# Patient Record
Sex: Female | Born: 1945
Health system: Southern US, Community
[De-identification: ages and names within clinical notes are randomized; demographics above are authoritative.]

## PROBLEM LIST (undated history)

## (undated) DIAGNOSIS — K219 Gastro-esophageal reflux disease without esophagitis: Secondary | ICD-10-CM

## (undated) DIAGNOSIS — D510 Vitamin B12 deficiency anemia due to intrinsic factor deficiency: Secondary | ICD-10-CM

## (undated) DIAGNOSIS — L900 Lichen sclerosus et atrophicus: Secondary | ICD-10-CM

## (undated) DIAGNOSIS — G5600 Carpal tunnel syndrome, unspecified upper limb: Secondary | ICD-10-CM

## (undated) DIAGNOSIS — M7550 Bursitis of unspecified shoulder: Secondary | ICD-10-CM

## (undated) HISTORY — DX: Lichen sclerosus et atrophicus: L90.0

## (undated) HISTORY — DX: Bursitis of unspecified shoulder: M75.50

## (undated) HISTORY — PX: TONSILLECTOMY: SUR1361

## (undated) HISTORY — PX: ABDOMINAL HYSTERECTOMY: SHX81

## (undated) HISTORY — DX: Carpal tunnel syndrome, unspecified upper limb: G56.00

## (undated) HISTORY — DX: Vitamin B12 deficiency anemia due to intrinsic factor deficiency: D51.0

---

## 2006-12-16 ENCOUNTER — Ambulatory Visit: Payer: Self-pay | Admitting: Unknown Physician Specialty

## 2008-11-24 ENCOUNTER — Emergency Department: Payer: Self-pay

## 2008-11-26 ENCOUNTER — Inpatient Hospital Stay: Payer: Self-pay | Admitting: Internal Medicine

## 2011-01-24 ENCOUNTER — Emergency Department: Payer: Self-pay | Admitting: Emergency Medicine

## 2011-09-11 ENCOUNTER — Emergency Department: Payer: Self-pay | Admitting: Emergency Medicine

## 2011-10-08 ENCOUNTER — Ambulatory Visit: Payer: Self-pay | Admitting: Unknown Physician Specialty

## 2012-06-15 ENCOUNTER — Emergency Department: Payer: Self-pay | Admitting: Emergency Medicine

## 2012-06-15 LAB — CBC
MCH: 27.2 pg (ref 26.0–34.0)
MCV: 83 fL (ref 80–100)
Platelet: 300 10*3/uL (ref 150–440)
RBC: 4.72 10*6/uL (ref 3.80–5.20)
RDW: 17 % — ABNORMAL HIGH (ref 11.5–14.5)

## 2012-06-15 LAB — COMPREHENSIVE METABOLIC PANEL
Albumin: 4 g/dL (ref 3.4–5.0)
Alkaline Phosphatase: 89 U/L (ref 50–136)
Anion Gap: 8 (ref 7–16)
BUN: 7 mg/dL (ref 7–18)
Bilirubin,Total: 0.5 mg/dL (ref 0.2–1.0)
Co2: 31 mmol/L (ref 21–32)
Creatinine: 0.66 mg/dL (ref 0.60–1.30)
EGFR (African American): >60
Glucose: 112 mg/dL — ABNORMAL HIGH (ref 65–99)
Osmolality: 282 (ref 275–301)
SGPT (ALT): 21 U/L
Sodium: 142 mmol/L (ref 136–145)
Total Protein: 7.5 g/dL (ref 6.4–8.2)

## 2012-06-15 LAB — URINALYSIS, COMPLETE
Bacteria: NONE SEEN
Bilirubin,UR: NEGATIVE
Blood: NEGATIVE
Nitrite: NEGATIVE
Ph: 8 (ref 4.5–8.0)
RBC,UR: <1 /HPF (ref 0–5)
Specific Gravity: 1.009 (ref 1.003–1.030)
Squamous Epithelial: <1
WBC UR: NONE SEEN /HPF (ref 0–5)

## 2012-06-15 LAB — MAGNESIUM: Magnesium: 1.8 mg/dL

## 2012-06-15 LAB — CK TOTAL AND CKMB (NOT AT ARMC)
CK, Total: 104 U/L (ref 21–215)
CK-MB: 0.8 ng/mL (ref 0.5–3.6)

## 2012-06-15 LAB — TROPONIN I: Troponin-I: <0.02 ng/mL

## 2012-06-15 LAB — PHOSPHORUS: Phosphorus: 3.3 mg/dL (ref 2.5–4.9)

## 2013-12-01 HISTORY — PX: OTHER SURGICAL HISTORY: SHX169

## 2014-01-16 ENCOUNTER — Emergency Department: Payer: Self-pay | Admitting: Emergency Medicine

## 2014-05-14 ENCOUNTER — Emergency Department: Payer: Self-pay | Admitting: Emergency Medicine

## 2014-05-14 LAB — URINALYSIS, COMPLETE
BILIRUBIN, UR: NEGATIVE
Bacteria: NONE SEEN
Blood: NEGATIVE
Glucose,UR: NEGATIVE mg/dL (ref 0–75)
Leukocyte Esterase: NEGATIVE
Nitrite: NEGATIVE
Ph: 7 (ref 4.5–8.0)
Protein: NEGATIVE
Specific Gravity: 1.058 (ref 1.003–1.030)
Squamous Epithelial: 1
WBC UR: 1 /HPF (ref 0–5)

## 2014-05-14 LAB — CBC
HCT: 36.8 % (ref 35.0–47.0)
HGB: 12 g/dL (ref 12.0–16.0)
MCH: 26.5 pg (ref 26.0–34.0)
MCHC: 32.7 g/dL (ref 32.0–36.0)
MCV: 81 fL (ref 80–100)
Platelet: 277 10*3/uL (ref 150–440)
RBC: 4.55 10*6/uL (ref 3.80–5.20)
RDW: 17.7 % — ABNORMAL HIGH (ref 11.5–14.5)
WBC: 4.7 10*3/uL (ref 3.6–11.0)

## 2014-05-14 LAB — COMPREHENSIVE METABOLIC PANEL
ALBUMIN: 3.7 g/dL (ref 3.4–5.0)
ALT: 17 U/L (ref 12–78)
Alkaline Phosphatase: 77 U/L
Anion Gap: 6 — ABNORMAL LOW (ref 7–16)
BUN: 6 mg/dL — ABNORMAL LOW (ref 7–18)
Bilirubin,Total: 0.5 mg/dL (ref 0.2–1.0)
CHLORIDE: 105 mmol/L (ref 98–107)
CREATININE: 0.54 mg/dL — AB (ref 0.60–1.30)
Calcium, Total: 9 mg/dL (ref 8.5–10.1)
Co2: 28 mmol/L (ref 21–32)
EGFR (Non-African Amer.): >60
GLUCOSE: 109 mg/dL — AB (ref 65–99)
Osmolality: 276 (ref 275–301)
Potassium: 3.7 mmol/L (ref 3.5–5.1)
SGOT(AST): 16 U/L (ref 15–37)
Sodium: 139 mmol/L (ref 136–145)
TOTAL PROTEIN: 6.6 g/dL (ref 6.4–8.2)

## 2014-05-14 LAB — LIPASE, BLOOD: Lipase: 127 U/L (ref 73–393)

## 2014-06-21 ENCOUNTER — Ambulatory Visit: Payer: Self-pay | Admitting: Obstetrics and Gynecology

## 2014-06-21 LAB — BASIC METABOLIC PANEL
Anion Gap: 3 — ABNORMAL LOW (ref 7–16)
BUN: 11 mg/dL (ref 7–18)
Calcium, Total: 8.1 mg/dL — ABNORMAL LOW (ref 8.5–10.1)
Chloride: 103 mmol/L (ref 98–107)
Co2: 34 mmol/L — ABNORMAL HIGH (ref 21–32)
Creatinine: 0.71 mg/dL (ref 0.60–1.30)
EGFR (African American): >60
EGFR (Non-African Amer.): >60
GLUCOSE: 83 mg/dL (ref 65–99)
Osmolality: 278 (ref 275–301)
Potassium: 4.1 mmol/L (ref 3.5–5.1)
SODIUM: 140 mmol/L (ref 136–145)

## 2014-06-21 LAB — CBC
HCT: 36 % (ref 35.0–47.0)
HGB: 11.4 g/dL — AB (ref 12.0–16.0)
MCH: 26.1 pg (ref 26.0–34.0)
MCHC: 31.6 g/dL — ABNORMAL LOW (ref 32.0–36.0)
MCV: 83 fL (ref 80–100)
Platelet: 321 10*3/uL (ref 150–440)
RBC: 4.35 10*6/uL (ref 3.80–5.20)
RDW: 17.5 % — ABNORMAL HIGH (ref 11.5–14.5)
WBC: 5.2 10*3/uL (ref 3.6–11.0)

## 2014-06-27 ENCOUNTER — Ambulatory Visit: Payer: Self-pay | Admitting: Obstetrics and Gynecology

## 2014-06-28 LAB — CBC WITH DIFFERENTIAL/PLATELET
BASOS ABS: 0 10*3/uL (ref 0.0–0.1)
Basophil %: 0.1 %
EOS PCT: 0 %
Eosinophil #: 0 10*3/uL (ref 0.0–0.7)
HCT: 33 % — AB (ref 35.0–47.0)
HGB: 10.4 g/dL — ABNORMAL LOW (ref 12.0–16.0)
LYMPHS ABS: 1.4 10*3/uL (ref 1.0–3.6)
LYMPHS PCT: 11.7 %
MCH: 26 pg (ref 26.0–34.0)
MCHC: 31.5 g/dL — AB (ref 32.0–36.0)
MCV: 83 fL (ref 80–100)
Monocyte #: 1 x10 3/mm — ABNORMAL HIGH (ref 0.2–0.9)
Monocyte %: 8.3 %
Neutrophil #: 9.3 10*3/uL — ABNORMAL HIGH (ref 1.4–6.5)
Neutrophil %: 79.9 %
PLATELETS: 318 10*3/uL (ref 150–440)
RBC: 4 10*6/uL (ref 3.80–5.20)
RDW: 17.8 % — AB (ref 11.5–14.5)
WBC: 11.6 10*3/uL — ABNORMAL HIGH (ref 3.6–11.0)

## 2014-06-28 LAB — BASIC METABOLIC PANEL
ANION GAP: 7 (ref 7–16)
BUN: 6 mg/dL — ABNORMAL LOW (ref 7–18)
CALCIUM: 8.2 mg/dL — AB (ref 8.5–10.1)
CHLORIDE: 106 mmol/L (ref 98–107)
CO2: 29 mmol/L (ref 21–32)
CREATININE: 0.56 mg/dL — AB (ref 0.60–1.30)
EGFR (African American): >60
EGFR (Non-African Amer.): >60
GLUCOSE: 150 mg/dL — AB (ref 65–99)
OSMOLALITY: 284 (ref 275–301)
Potassium: 3.8 mmol/L (ref 3.5–5.1)
Sodium: 142 mmol/L (ref 136–145)

## 2014-06-30 LAB — PATHOLOGY REPORT

## 2015-01-07 LAB — HM MAMMOGRAPHY

## 2015-01-17 ENCOUNTER — Ambulatory Visit: Payer: Self-pay

## 2015-01-17 LAB — HM MAMMOGRAPHY

## 2015-03-24 NOTE — Op Note (Signed)
PATIENT NAME:  Kayla Johnson, Kayla Johnson MR#:  [id number] DATE OF BIRTH:  Apr 29, 1946  DATE OF PROCEDURE:  06/27/2014  PREOPERATIVE DIAGNOSES: Acute onset pelvic pain and pedunculated fibroid.   POSTOPERATIVE DIAGNOSES:  1.  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 A small, 1-cm subserosal fibroid at the fundus of the uterus.  2.  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   OPERATION PERFORMED:  1.  Laparoscopic lysis of adhesions.  2.  Bilateral salpingo-oophorectomy.   ANESTHESIA USED: General.   PRIMARY SURGEON: Dorthula Nettles, M.D.   ASSISTANT:  Barnett Applebaum, M.D.   ESTIMATED BLOOD LOSS: 15 mL.   OPERATIVE FLUIDS: 750 mL of crystalloid.   PREOPERATIVE ANTIBIOTICS: 1 gram of Ancef.   DRAINS OR TUBES:  Foley to gravity drainage.   IMPLANTS: None.   INTRAOPERATIVE FINDINGS: The patient presented to the ER previously with acute onset abdominal pain, which was attributed to potential torsion of what appeared to be a pedunculated fibroid on ultrasound and CT scan. This had been documented on prior scans and was stable in size. Differential did include ovarian etiology and CA-125 was obtained and was normal  preoperatively. The fibroid appeared to have a feeder vessel on imaging with Doppler flow.  Given the fact that the patient had had prior surgery on her right ovary via a midline vertical incision, the decision was made for Palmer point entry, given that we suspected adhesions in the midline. The pathology from her initial surgery, which was several years ago, was not available for review.    The entry into the abdomen was via Phineas Douglas point. There were adhesions in the midline which were taken down bluntly.  The left ovary was grossly normal in appearance and atrophic. The left tube was normal, as well. The uterus was unremarkable other than a small 1-cm subserosal fundal fibroid. The right ovary  was grossly enlarged and likely represented the findings on CT and ultrasound. There was a solid 4-5 cm mass likely representing an ovarian fibroma.  The right tube was normal. The ureters were visualized pre- and post dissection.   SPECIMENS REMOVED: Right tube and ovary and left tube and ovary.   PATIENT CONDITION FOLLOWING PROCEDURE: Stable.   PROCEDURE IN DETAIL: Risks, benefits, and alternatives of the procedure were discussed with the patient prior to proceeding to the operating room. The patient was taken to the operating room where she was placed under general endotracheal anesthesia. She was positioned in the dorsal lithotomy position utilizing Allen stirrups, prepped and draped in the usual sterile fashion. A timeout was performed.   Attention was turned to the patient's pelvis. The bladder was drained via an indwelling Foley catheter. An operative speculum was then placed. The anterior lip of the cervix was visualized, grasped with a single-tooth tenaculum and a Hulka tenaculum was then placed to allow uterine manipulation. The single-tooth tenaculum and speculum were removed.   Attention was turned to the patient's abdomen. Palmer point was injected with 0.5% Sensorcaine. A stab incision was made and a 5-mm XL trocar was used to gain entry into the peritoneal cavity via Palmer point under direct visualization. Once entry into the abdominal cavity had been established, pneumoperitoneum was started. Inspection of the abdomen and pelvis revealed filmy adhesions of the omentum to the prior midline vertical incision.   The uterus was not able to be visualized prior to taking down the adhesions. An 8-mm left lower quadrant robotic trocar  was placed and a blunt grasper was used to take down the midline adhesions. Following this, the uterus was able to be clearly visualized. There was no fibroid other than a small 1-cm subserosal fibroid at the fundus. The right ovary, however, was grossly enlarged  with a solid tumor, likely representing ovarian fibroma. A 12-mm assistant port was placed at the umbilicus.  The right ovary was grasped with a blunt grasper. There was an adhesion of small bowel to the right ovary which was taken down using a 5-mm LigaSure. The ureter was visualized. The IP ligament was then transected well away from the ureter using the 5-mm Harmonic device and the ovary and right fallopian tube were dissected off  the attachments to the uterus and mesosalpinx using make change the Harmonic device. The specimen was then placed in the cul-de-sac.    The left ovary was visualized and noted to be atrophic. There were some adhesions of the left sigmoid colon to the pelvic sidewall precluding clear visualization of the IP ligament.  The colon was mobilized by dissecting the retroperitoneum and the IP ligament was then clearly visualized as was the left ureter. The IP ligament was transected using a 5-mm Harmonic device, and then the ovary and tube were dissected off the attachments to the mesosalpinx and the uterus also using the 5-mm Harmonic.   Following this the left ovary and tube were removed through the 12-mm port site. The port site was then extended, and a reusable Endo Catch bag was placed into the pelvis into which the right ovary was placed. The ovary was then removed via the Endo Catch bag. Some morcellation of the ovary had to be done in order to remove the ovary intact. This was done within the bag itself without spillage.   Once the right ovary was removed the fascial incision, which had been extended to approximately 3-4 cm, was closed using #1 Vicryl on a UR-6 needle. Following fascial closure the skin was closed using 4-0 Monocryl in a subcuticular fashion. The pelvis was reinspected.  All pedicles were noted to be hemostatic. The pelvis was irrigated and pneumoperitoneum was evacuated. The port sites were dressed with Dermabond. Sponge, needle, and instrument counts were  correct x2. The patient tolerated the procedure well and was taken to the recovery room in stable condition.   ____________________________ Stoney Bang. Georgianne Fick, MD ams:lt D: 06/28/2014 08:32:43 ET T: 06/28/2014 10:14:37 ET JOB#: [id number]  cc: Stoney Bang. Georgianne Fick, MD, <Dictator> Dorthula Nettles MD ELECTRONICALLY SIGNED 06/28/2014 22:23

## 2015-05-04 ENCOUNTER — Ambulatory Visit (INDEPENDENT_AMBULATORY_CARE_PROVIDER_SITE_OTHER): Payer: PPO | Admitting: Unknown Physician Specialty

## 2015-05-04 DIAGNOSIS — E538 Deficiency of other specified B group vitamins: Secondary | ICD-10-CM

## 2015-05-04 MED ORDER — CYANOCOBALAMIN 1000 MCG/ML IJ SOLN
1000.0000 ug | Freq: Once | INTRAMUSCULAR | Status: AC
  Administered 2015-05-04: 1000 ug via INTRAMUSCULAR

## 2015-05-11 ENCOUNTER — Ambulatory Visit (INDEPENDENT_AMBULATORY_CARE_PROVIDER_SITE_OTHER): Payer: PPO

## 2015-05-11 DIAGNOSIS — E538 Deficiency of other specified B group vitamins: Secondary | ICD-10-CM

## 2015-05-11 MED ORDER — CYANOCOBALAMIN 1000 MCG/ML IJ SOLN
1000.0000 ug | Freq: Once | INTRAMUSCULAR | Status: AC
  Administered 2015-05-11: 1000 ug via INTRAMUSCULAR

## 2015-06-06 ENCOUNTER — Ambulatory Visit (INDEPENDENT_AMBULATORY_CARE_PROVIDER_SITE_OTHER): Payer: PPO

## 2015-06-06 DIAGNOSIS — E538 Deficiency of other specified B group vitamins: Secondary | ICD-10-CM

## 2015-06-06 MED ORDER — CYANOCOBALAMIN 1000 MCG/ML IJ SOLN
1000.0000 ug | Freq: Once | INTRAMUSCULAR | Status: AC
  Administered 2015-06-06: 1000 ug via INTRAMUSCULAR

## 2015-07-04 ENCOUNTER — Ambulatory Visit (INDEPENDENT_AMBULATORY_CARE_PROVIDER_SITE_OTHER): Payer: PPO

## 2015-07-04 DIAGNOSIS — D509 Iron deficiency anemia, unspecified: Secondary | ICD-10-CM

## 2015-07-04 MED ORDER — CYANOCOBALAMIN 1000 MCG/ML IJ SOLN
1000.0000 ug | Freq: Once | INTRAMUSCULAR | Status: AC
  Administered 2015-07-04: 1000 ug via INTRAMUSCULAR

## 2015-08-01 ENCOUNTER — Ambulatory Visit (INDEPENDENT_AMBULATORY_CARE_PROVIDER_SITE_OTHER): Payer: PPO

## 2015-08-01 DIAGNOSIS — E538 Deficiency of other specified B group vitamins: Secondary | ICD-10-CM | POA: Diagnosis not present

## 2015-08-01 MED ORDER — CYANOCOBALAMIN 1000 MCG/ML IJ SOLN
1000.0000 ug | Freq: Once | INTRAMUSCULAR | Status: AC
  Administered 2015-08-01: 1000 ug via INTRAMUSCULAR

## 2015-08-29 ENCOUNTER — Ambulatory Visit (INDEPENDENT_AMBULATORY_CARE_PROVIDER_SITE_OTHER): Payer: PPO

## 2015-08-29 DIAGNOSIS — E538 Deficiency of other specified B group vitamins: Secondary | ICD-10-CM | POA: Diagnosis not present

## 2015-08-29 MED ORDER — CYANOCOBALAMIN 1000 MCG/ML IJ SOLN
1000.0000 ug | Freq: Once | INTRAMUSCULAR | Status: AC
  Administered 2015-08-29: 1000 ug via INTRAMUSCULAR

## 2015-09-26 ENCOUNTER — Ambulatory Visit (INDEPENDENT_AMBULATORY_CARE_PROVIDER_SITE_OTHER): Payer: PPO

## 2015-09-26 DIAGNOSIS — E538 Deficiency of other specified B group vitamins: Secondary | ICD-10-CM | POA: Diagnosis not present

## 2015-09-26 MED ORDER — CYANOCOBALAMIN 1000 MCG/ML IJ SOLN
1000.0000 ug | Freq: Once | INTRAMUSCULAR | Status: AC
  Administered 2015-09-26: 1000 ug via INTRAMUSCULAR

## 2015-10-22 ENCOUNTER — Encounter: Payer: Self-pay | Admitting: Family Medicine

## 2015-10-22 ENCOUNTER — Ambulatory Visit (INDEPENDENT_AMBULATORY_CARE_PROVIDER_SITE_OTHER): Payer: PPO | Admitting: Family Medicine

## 2015-10-22 VITALS — BP 99/62 | HR 78 | Temp 97.8°F | Ht 61.8 in | Wt 105.0 lb

## 2015-10-22 DIAGNOSIS — J019 Acute sinusitis, unspecified: Secondary | ICD-10-CM | POA: Diagnosis not present

## 2015-10-22 DIAGNOSIS — J329 Chronic sinusitis, unspecified: Secondary | ICD-10-CM | POA: Insufficient documentation

## 2015-10-22 DIAGNOSIS — D510 Vitamin B12 deficiency anemia due to intrinsic factor deficiency: Secondary | ICD-10-CM | POA: Insufficient documentation

## 2015-10-22 MED ORDER — AMOXICILLIN 875 MG PO TABS: 875.0000 mg | ORAL_TABLET | Freq: Two times a day (BID) | ORAL | Status: DC

## 2015-10-22 NOTE — Assessment & Plan Note (Signed)
Patient taking her monthly B12 shot

## 2015-10-22 NOTE — Progress Notes (Signed)
   BP 99/62 mmHg  Pulse 78  Temp(Src) 97.8 F (36.6 C)  Ht 5' 1.8" (1.57 m)  Wt 105 lb (47.628 kg)  BMI 19.32 kg/m2  SpO2 99%   Subjective:    Patient ID: Kayla Johnson, female    DOB: 1946-02-11, 69 y.o.   MRN: [id number]  HPI: [name] is a 69 y.o. female  Chief Complaint  Patient presents with  . URI    with cough   patient with sinus congestion and drainage cough going on for the last 5 days not sure about fever but has felt hot and sweaty and achy all over with marked fatigue With a productive cough Sinus pressure worse with bending her head leaning forward   Relevant past medical, surgical, family and social history reviewed and updated as indicated. Interim medical history since our last visit reviewed. Allergies and medications reviewed and updated.  Review of Systems  Constitutional: Positive for fever, chills and fatigue.  HENT: Positive for congestion, postnasal drip, rhinorrhea, sinus pressure, sneezing and sore throat.   Respiratory: Positive for cough. Negative for choking, chest tightness and shortness of breath.   Cardiovascular: Negative for chest pain, palpitations and leg swelling.    Per HPI unless specifically indicated above     Objective:    BP 99/62 mmHg  Pulse 78  Temp(Src) 97.8 F (36.6 C)  Ht 5' 1.8" (1.57 m)  Wt 105 lb (47.628 kg)  BMI 19.32 kg/m2  SpO2 99%  Wt Readings from Last 3 Encounters:  10/22/15 105 lb (47.628 kg)  04/11/15 109 lb (49.442 kg)    Physical Exam  Constitutional: She is oriented to person, place, and time. She appears well-developed and well-nourished. No distress.  HENT:  Head: Normocephalic and atraumatic.  Right Ear: Hearing and external ear normal.  Left Ear: Hearing and external ear normal.  Nose: Nose normal.  Mouth/Throat: Oropharyngeal exudate present.  Eyes: Conjunctivae and lids are normal. Pupils are equal, round, and reactive to light. Right eye exhibits no discharge. Left eye exhibits no  discharge. No scleral icterus.  Neck: No thyromegaly present.  Cardiovascular: Normal rate, regular rhythm and normal heart sounds.   Pulmonary/Chest: Effort normal and breath sounds normal. No respiratory distress.  Musculoskeletal: Normal range of motion.  Lymphadenopathy:    She has no cervical adenopathy.  Neurological: She is alert and oriented to person, place, and time.  Skin: Skin is intact. No rash noted.  Psychiatric: She has a normal mood and affect. Her speech is normal and behavior is normal. Judgment and thought content normal. Cognition and memory are normal.        Assessment & Plan:   Problem List Items Addressed This Visit      Respiratory   Sinusitis - Primary   Relevant Medications   amoxicillin (AMOXIL) 875 MG tablet     Other   Pernicious anemia    Patient taking her monthly B12 shot          Follow up plan: Return for Physical Exam with Kathrine Haddock.

## 2015-10-24 ENCOUNTER — Ambulatory Visit: Payer: PPO

## 2015-10-24 ENCOUNTER — Telehealth: Payer: Self-pay | Admitting: Family Medicine

## 2015-10-24 ENCOUNTER — Encounter: Payer: Self-pay | Admitting: Family Medicine

## 2015-10-24 NOTE — Telephone Encounter (Signed)
Called pt and informed work excuse note was ready for pick up.

## 2015-10-24 NOTE — Telephone Encounter (Signed)
Ok

## 2015-10-24 NOTE — Telephone Encounter (Signed)
Pt called needs a work note for 11/21-11/25/16. Can the note be worded to say that Thursday and Friday are at her discretion. Thanks.

## 2015-10-24 NOTE — Telephone Encounter (Signed)
All set!

## 2015-10-31 ENCOUNTER — Ambulatory Visit (INDEPENDENT_AMBULATORY_CARE_PROVIDER_SITE_OTHER): Payer: PPO

## 2015-10-31 DIAGNOSIS — E538 Deficiency of other specified B group vitamins: Secondary | ICD-10-CM

## 2015-10-31 MED ORDER — CYANOCOBALAMIN 1000 MCG/ML IJ SOLN
1000.0000 ug | Freq: Once | INTRAMUSCULAR | Status: AC
  Administered 2015-10-31: 1000 ug via INTRAMUSCULAR

## 2015-11-08 ENCOUNTER — Encounter: Payer: Self-pay | Admitting: Unknown Physician Specialty

## 2015-11-21 ENCOUNTER — Encounter: Payer: PPO | Admitting: Unknown Physician Specialty

## 2015-11-27 ENCOUNTER — Other Ambulatory Visit: Payer: Self-pay | Admitting: Family Medicine

## 2015-11-27 DIAGNOSIS — D510 Vitamin B12 deficiency anemia due to intrinsic factor deficiency: Secondary | ICD-10-CM

## 2015-11-27 MED ORDER — CYANOCOBALAMIN 1000 MCG/ML IJ SOLN
1000.0000 ug | Freq: Once | INTRAMUSCULAR | Status: AC
  Administered 2015-11-28: 1000 ug via INTRAMUSCULAR

## 2015-11-27 NOTE — Assessment & Plan Note (Signed)
IM B12 1000 mcg x 1 tomorrow

## 2015-11-28 ENCOUNTER — Ambulatory Visit (INDEPENDENT_AMBULATORY_CARE_PROVIDER_SITE_OTHER): Payer: PPO

## 2015-11-28 DIAGNOSIS — D510 Vitamin B12 deficiency anemia due to intrinsic factor deficiency: Secondary | ICD-10-CM

## 2015-12-12 ENCOUNTER — Ambulatory Visit (INDEPENDENT_AMBULATORY_CARE_PROVIDER_SITE_OTHER): Payer: PPO | Admitting: Unknown Physician Specialty

## 2015-12-12 ENCOUNTER — Encounter: Payer: Self-pay | Admitting: Unknown Physician Specialty

## 2015-12-12 VITALS — BP 117/71 | HR 89 | Temp 98.4°F | Ht 62.0 in | Wt 106.2 lb

## 2015-12-12 DIAGNOSIS — Z23 Encounter for immunization: Secondary | ICD-10-CM | POA: Diagnosis not present

## 2015-12-12 DIAGNOSIS — Z0000 Encounter for general adult medical examination without abnormal findings: Secondary | ICD-10-CM | POA: Diagnosis not present

## 2015-12-12 DIAGNOSIS — H9313 Tinnitus, bilateral: Secondary | ICD-10-CM

## 2015-12-12 DIAGNOSIS — J309 Allergic rhinitis, unspecified: Secondary | ICD-10-CM

## 2015-12-12 DIAGNOSIS — D510 Vitamin B12 deficiency anemia due to intrinsic factor deficiency: Secondary | ICD-10-CM

## 2015-12-12 MED ORDER — CYANOCOBALAMIN 1000 MCG/ML IJ SOLN
1000.0000 ug | INTRAMUSCULAR | Status: DC
  Administered 2016-02-20 – 2017-07-16 (×8): 1000 ug via INTRAMUSCULAR

## 2015-12-12 NOTE — Patient Instructions (Signed)
Try plain Loratadine or Fexofenodine for allergies.

## 2015-12-12 NOTE — Assessment & Plan Note (Addendum)
Vit B12 today.  Continue monthly B12 injections but check todays labs

## 2015-12-12 NOTE — Progress Notes (Signed)
BP 117/71 mmHg  Pulse 89  Temp(Src) 98.4 F (36.9 C)  Ht 5\' 2"  (1.575 m)  Wt 106 lb 3.2 oz (48.172 kg)  BMI 19.42 kg/m2  SpO2 98%   Subjective:    Patient ID: Kayla Johnson, female    DOB: 06/14/1946, 70 y.o.   MRN: [id number]  HPI: Kayla Johnson is a 70 y.o. female  Chief Complaint  Patient presents with  . Medicare Wellness    pt states she has been having "ringing in her head" and sneezing   Functional Status Survey: Is the patient deaf or have difficulty hearing?: No Does the patient have difficulty seeing, even when wearing glasses/contacts?: No Does the patient have difficulty concentrating, remembering, or making decisions?: No Does the patient have difficulty walking or climbing stairs?: No Does the patient have difficulty dressing or bathing?: No Does the patient have difficulty doing errands alone such as visiting a doctor's office or shopping?: No  Depression screen [facility] 2/9 12/12/2015 10/22/2015  Decreased Interest 0 0  Down, Depressed, Hopeless 0 0  PHQ - 2 Score 0 0   Fall Risk  12/12/2015 10/22/2015  Falls in the past year? No No    Pt is able to perform complex mental tasks, recognize clock face, recognize time and do a 3 item recall.     Pernicious Anemia Gets monthly B12 injections.  Without symptoms  Relevant past medical, surgical, family and social history reviewed and updated as indicated. Interim medical history since our last visit reviewed. Allergies and medications reviewed and updated.  Review of Systems  Constitutional: Negative.   HENT: Positive for sneezing.        Pt is c/o "buzzing" in her ears bilaterally  Eyes: Negative.   Respiratory: Negative.   Cardiovascular: Negative.   Gastrointestinal: Negative.   Endocrine: Negative.   Genitourinary: Negative.   Musculoskeletal: Negative.   Skin: Negative.   Allergic/Immunologic: Negative.   Neurological: Negative.   Hematological: Negative.   Psychiatric/Behavioral: Negative.      Per HPI unless specifically indicated above     Objective:    BP 117/71 mmHg  Pulse 89  Temp(Src) 98.4 F (36.9 C)  Ht 5\' 2"  (1.575 m)  Wt 106 lb 3.2 oz (48.172 kg)  BMI 19.42 kg/m2  SpO2 98%  Wt Readings from Last 3 Encounters:  12/12/15 106 lb 3.2 oz (48.172 kg)  10/22/15 105 lb (47.628 kg)  04/11/15 109 lb (49.442 kg)    Physical Exam  Constitutional: She is oriented to person, place, and time. She appears well-developed and well-nourished.  HENT:  Head: Normocephalic and atraumatic.  Eyes: Pupils are equal, round, and reactive to light. Right eye exhibits no discharge. Left eye exhibits no discharge. No scleral icterus.  Neck: Normal range of motion. Neck supple. Carotid bruit is not present. No thyromegaly present.  Cardiovascular: Normal rate, regular rhythm and normal heart sounds.  Exam reveals no gallop and no friction rub.   No murmur heard. Pulmonary/Chest: Effort normal and breath sounds normal. No respiratory distress. She has no wheezes. She has no rales.  Abdominal: Soft. Bowel sounds are normal. There is no tenderness. There is no rebound.  Genitourinary: No breast swelling, tenderness or discharge.  Musculoskeletal: Normal range of motion.  Lymphadenopathy:    She has no cervical adenopathy.  Neurological: She is alert and oriented to person, place, and time.  Skin: Skin is warm, dry and intact. No rash noted.  Psychiatric: She has a normal mood and  affect. Her speech is normal and behavior is normal. Judgment and thought content normal. Cognition and memory are normal.     Assessment & Plan:   Problem List Items Addressed This Visit      Unprioritized   Pernicious anemia    Vit B12 today.  Continue monthly B12 injections but check todays labs      Relevant Medications   cyanocobalamin ((VITAMIN B-12)) injection 1,000 mcg (Start on 12/12/2015  3:00 PM)   Other Relevant Orders   Vitamin B12   CBC with Differential/Platelet   Bilateral tinnitus -  Primary    Refer to ENT at pt request      Relevant Orders   Ambulatory referral to ENT   Allergic rhinitis    Other Visit Diagnoses    Routine general medical examination at a health care facility        Relevant Orders    Pneumococcal conjugate vaccine 13-valent IM    Cologuard    Lipid Panel w/o Chol/HDL Ratio        Follow up plan: Return in about 1 year (around 12/11/2016).

## 2015-12-12 NOTE — Assessment & Plan Note (Signed)
Refer to ENT at pt request. 

## 2015-12-13 LAB — CBC WITH DIFFERENTIAL/PLATELET
BASOS ABS: 0 10*3/uL (ref 0.0–0.2)
Basos: 0 %
EOS (ABSOLUTE): 0.2 10*3/uL (ref 0.0–0.4)
Eos: 4 %
HEMOGLOBIN: 11.2 g/dL (ref 11.1–15.9)
Hematocrit: 34 % (ref 34.0–46.6)
IMMATURE GRANS (ABS): 0 10*3/uL (ref 0.0–0.1)
Immature Granulocytes: 0 %
LYMPHS: 33 %
Lymphocytes Absolute: 1.9 10*3/uL (ref 0.7–3.1)
MCH: 25.9 pg — AB (ref 26.6–33.0)
MCHC: 32.9 g/dL (ref 31.5–35.7)
MCV: 79 fL (ref 79–97)
MONOCYTES: 8 %
Monocytes Absolute: 0.5 10*3/uL (ref 0.1–0.9)
NEUTROS ABS: 3.1 10*3/uL (ref 1.4–7.0)
Neutrophils: 55 %
PLATELETS: 433 10*3/uL — AB (ref 150–379)
RBC: 4.33 x10E6/uL (ref 3.77–5.28)
RDW: 16.1 % — ABNORMAL HIGH (ref 12.3–15.4)
WBC: 5.7 10*3/uL (ref 3.4–10.8)

## 2015-12-13 LAB — LIPID PANEL W/O CHOL/HDL RATIO
CHOLESTEROL TOTAL: 173 mg/dL (ref 100–199)
HDL: 55 mg/dL (ref >39)
LDL CALC: 107 mg/dL — AB (ref 0–99)
TRIGLYCERIDES: 54 mg/dL (ref 0–149)
VLDL CHOLESTEROL CAL: 11 mg/dL (ref 5–40)

## 2015-12-13 LAB — VITAMIN B12: VITAMIN B 12: 688 pg/mL (ref 211–946)

## 2015-12-26 ENCOUNTER — Other Ambulatory Visit: Payer: Self-pay | Admitting: Unknown Physician Specialty

## 2015-12-26 ENCOUNTER — Ambulatory Visit (INDEPENDENT_AMBULATORY_CARE_PROVIDER_SITE_OTHER): Payer: PPO

## 2015-12-26 DIAGNOSIS — E538 Deficiency of other specified B group vitamins: Secondary | ICD-10-CM

## 2015-12-26 DIAGNOSIS — D510 Vitamin B12 deficiency anemia due to intrinsic factor deficiency: Secondary | ICD-10-CM

## 2015-12-26 MED ORDER — CYANOCOBALAMIN 1000 MCG/ML IJ SOLN
1000.0000 ug | INTRAMUSCULAR | Status: DC
  Administered 2015-12-26 – 2017-11-18 (×15): 1000 ug via INTRAMUSCULAR

## 2016-01-02 LAB — COLOGUARD: Cologuard: NEGATIVE

## 2016-01-08 ENCOUNTER — Telehealth: Payer: Self-pay

## 2016-01-08 NOTE — Telephone Encounter (Signed)
Have called patient multiple times to tell her of her appointment with Dr. Richardson Landry at [facility] 01-16-2016 at 2:00. Patient has not answered my calls and have left messages for her to return my call.  Just a heads up in case you'd like to reach out to her as well or if she calls and asks to speak to you.  Thank you! :)

## 2016-01-08 NOTE — Telephone Encounter (Signed)
Called and left patient a voicemail asking for her to please return my call.  

## 2016-01-09 NOTE — Telephone Encounter (Signed)
Called and left patient a voicemail asking for her to please return my call.  

## 2016-01-09 NOTE — Telephone Encounter (Signed)
Patient returned call and was notified of appointment. °

## 2016-01-23 ENCOUNTER — Ambulatory Visit: Payer: PPO

## 2016-01-23 ENCOUNTER — Ambulatory Visit (INDEPENDENT_AMBULATORY_CARE_PROVIDER_SITE_OTHER): Payer: PPO

## 2016-01-23 DIAGNOSIS — D510 Vitamin B12 deficiency anemia due to intrinsic factor deficiency: Secondary | ICD-10-CM | POA: Diagnosis not present

## 2016-02-20 ENCOUNTER — Ambulatory Visit (INDEPENDENT_AMBULATORY_CARE_PROVIDER_SITE_OTHER): Payer: PPO

## 2016-02-20 DIAGNOSIS — E538 Deficiency of other specified B group vitamins: Secondary | ICD-10-CM | POA: Diagnosis not present

## 2016-02-20 DIAGNOSIS — D510 Vitamin B12 deficiency anemia due to intrinsic factor deficiency: Secondary | ICD-10-CM

## 2016-03-19 ENCOUNTER — Ambulatory Visit (INDEPENDENT_AMBULATORY_CARE_PROVIDER_SITE_OTHER): Payer: PPO

## 2016-03-19 DIAGNOSIS — D510 Vitamin B12 deficiency anemia due to intrinsic factor deficiency: Secondary | ICD-10-CM | POA: Diagnosis not present

## 2016-03-19 DIAGNOSIS — E538 Deficiency of other specified B group vitamins: Secondary | ICD-10-CM

## 2016-04-16 ENCOUNTER — Ambulatory Visit: Payer: Self-pay

## 2016-04-23 ENCOUNTER — Ambulatory Visit (INDEPENDENT_AMBULATORY_CARE_PROVIDER_SITE_OTHER): Payer: PPO

## 2016-04-23 DIAGNOSIS — D510 Vitamin B12 deficiency anemia due to intrinsic factor deficiency: Secondary | ICD-10-CM

## 2016-04-23 DIAGNOSIS — E538 Deficiency of other specified B group vitamins: Secondary | ICD-10-CM

## 2016-05-14 ENCOUNTER — Ambulatory Visit: Payer: Self-pay

## 2016-05-21 ENCOUNTER — Ambulatory Visit (INDEPENDENT_AMBULATORY_CARE_PROVIDER_SITE_OTHER): Payer: PPO

## 2016-05-21 DIAGNOSIS — E538 Deficiency of other specified B group vitamins: Secondary | ICD-10-CM

## 2016-05-21 DIAGNOSIS — D510 Vitamin B12 deficiency anemia due to intrinsic factor deficiency: Secondary | ICD-10-CM

## 2016-06-18 ENCOUNTER — Ambulatory Visit (INDEPENDENT_AMBULATORY_CARE_PROVIDER_SITE_OTHER): Payer: PPO

## 2016-06-18 DIAGNOSIS — D510 Vitamin B12 deficiency anemia due to intrinsic factor deficiency: Secondary | ICD-10-CM | POA: Diagnosis not present

## 2016-07-16 ENCOUNTER — Ambulatory Visit (INDEPENDENT_AMBULATORY_CARE_PROVIDER_SITE_OTHER): Payer: PPO

## 2016-07-16 DIAGNOSIS — D510 Vitamin B12 deficiency anemia due to intrinsic factor deficiency: Secondary | ICD-10-CM | POA: Diagnosis not present

## 2016-07-16 DIAGNOSIS — E538 Deficiency of other specified B group vitamins: Secondary | ICD-10-CM

## 2016-08-20 ENCOUNTER — Encounter: Payer: Self-pay | Admitting: Unknown Physician Specialty

## 2016-08-20 ENCOUNTER — Ambulatory Visit: Payer: PPO

## 2016-08-20 ENCOUNTER — Ambulatory Visit (INDEPENDENT_AMBULATORY_CARE_PROVIDER_SITE_OTHER): Payer: PPO | Admitting: Unknown Physician Specialty

## 2016-08-20 ENCOUNTER — Telehealth: Payer: Self-pay | Admitting: Unknown Physician Specialty

## 2016-08-20 VITALS — BP 134/76 | HR 87 | Temp 98.5°F | Ht 61.8 in | Wt 110.0 lb

## 2016-08-20 DIAGNOSIS — S81801A Unspecified open wound, right lower leg, initial encounter: Secondary | ICD-10-CM

## 2016-08-20 DIAGNOSIS — D510 Vitamin B12 deficiency anemia due to intrinsic factor deficiency: Secondary | ICD-10-CM | POA: Diagnosis not present

## 2016-08-20 DIAGNOSIS — R5382 Chronic fatigue, unspecified: Secondary | ICD-10-CM | POA: Diagnosis not present

## 2016-08-20 DIAGNOSIS — M7551 Bursitis of right shoulder: Secondary | ICD-10-CM | POA: Diagnosis not present

## 2016-08-20 MED ORDER — MUPIROCIN CALCIUM 2 % EX CREA: 1.0000 "application " | TOPICAL_CREAM | Freq: Two times a day (BID) | CUTANEOUS | 0 refills | Status: DC

## 2016-08-20 MED ORDER — MUPIROCIN 2 % EX OINT: 1.0000 "application " | TOPICAL_OINTMENT | Freq: Two times a day (BID) | CUTANEOUS | 0 refills | Status: DC

## 2016-08-20 MED ORDER — MELOXICAM 15 MG PO TABS: 15.0000 mg | ORAL_TABLET | Freq: Every day | ORAL | 0 refills | Status: DC

## 2016-08-20 NOTE — Progress Notes (Signed)
BP 134/76 (BP Location: Left Arm, Patient Position: Sitting, Cuff Size: Normal)   Pulse 87   Temp 98.5 F (36.9 C)   Ht 5' 1.8" (1.57 m)   Wt 110 lb (49.9 kg)   SpO2 98%   BMI 20.25 kg/m    Subjective:    Patient ID: Kayla Johnson, female    DOB: 1946/10/23, 70 y.o.   MRN: [id number]  HPI: Kayla Johnson is a 70 y.o. female  Chief Complaint  Patient presents with  . Hypothyroidism    pt states she is due for thyroid check  . Arm Pain    pt states her right arm has been bothering her for a couple of weeks   . Wound Check    pt has a wound on her right leg she would like looked at    Hypothyroid She would like her thyroid checked.  Pt states she has very cold hands.  She is tired with standing at the job.  No weight gain or constipation.  Relatives are encouraging her to get a thyroid panel as opposed to just a TSH  Right arm pain Started one week ago.  Pain has been coming and going.  Tylenol helps at times.    Right leg wound Pt has wound on right leg right lateral leg when she slammed a car door into her leg.  Wears a bandaid at times.    Relevant past medical, surgical, family and social history reviewed and updated as indicated. Interim medical history since our last visit reviewed. Allergies and medications reviewed and updated.  Review of Systems  Per HPI unless specifically indicated above     Objective:    BP 134/76 (BP Location: Left Arm, Patient Position: Sitting, Cuff Size: Normal)   Pulse 87   Temp 98.5 F (36.9 C)   Ht 5' 1.8" (1.57 m)   Wt 110 lb (49.9 kg)   SpO2 98%   BMI 20.25 kg/m   Wt Readings from Last 3 Encounters:  08/20/16 110 lb (49.9 kg)  12/12/15 106 lb 3.2 oz (48.2 kg)  10/22/15 105 lb (47.6 kg)    Physical Exam  Constitutional: She is oriented to person, place, and time. She appears well-developed and well-nourished. No distress.  HENT:  Head: Normocephalic and atraumatic.  Eyes: Conjunctivae and lids are normal. Right eye  exhibits no discharge. Left eye exhibits no discharge. No scleral icterus.  Neck: Normal range of motion. Neck supple. No JVD present. Carotid bruit is not present.  Cardiovascular: Normal rate, regular rhythm and normal heart sounds.   Pulmonary/Chest: Effort normal and breath sounds normal.  Abdominal: Normal appearance. There is no splenomegaly or hepatomegaly.  Musculoskeletal: Normal range of motion.       Right shoulder: She exhibits tenderness. She exhibits normal range of motion, no bony tenderness, no swelling, no effusion and no crepitus.  Neurological: She is alert and oriented to person, place, and time.  Skin: Skin is warm, dry and intact. No rash noted. No pallor.  Psychiatric: She has a normal mood and affect. Her behavior is normal. Judgment and thought content normal.    Results for orders placed or performed in visit on 01/17/16  Cologuard  Result Value Ref Range   Cologuard Negative       Assessment & Plan:   Problem List Items Addressed This Visit      Unprioritized   Acute bursitis of right shoulder    Meloxicam and exercises.  Pt ed  Relevant Medications   meloxicam (MOBIC) 15 MG tablet   Leg wound, right    Keep covered.  Dressed today.  Recheck in 1 week.  May need colloid dressing and/or wound clinic.  Support hose      Relevant Medications   mupirocin cream (BACTROBAN) 2 %   Pernicious anemia    Other Visit Diagnoses    Chronic fatigue    -  Primary   Relevant Orders   Thyroid Panel With TSH       Follow up plan: Return in about 1 week (around 08/27/2016) for leg wound.

## 2016-08-20 NOTE — Telephone Encounter (Signed)
Yes please.  I will write a new medication

## 2016-08-20 NOTE — Patient Instructions (Addendum)
Impingement Syndrome, Rotator Cuff, Bursitis With Rehab °Impingement syndrome is a condition that involves inflammation of the tendons of the rotator cuff and the subacromial bursa, that causes pain in the shoulder. The rotator cuff consists of four tendons and muscles that control much of the shoulder and upper arm function. The subacromial bursa is a fluid filled sac that helps reduce friction between the rotator cuff and one of the bones of the shoulder (acromion). Impingement syndrome is usually an overuse injury that causes swelling of the bursa (bursitis), swelling of the tendon (tendonitis), and/or a tear of the tendon (strain). Strains are classified into three categories. Grade 1 strains cause pain, but the tendon is not lengthened. Grade 2 strains include a lengthened ligament, due to the ligament being stretched or partially ruptured. With grade 2 strains there is still function, although the function may be decreased. Grade 3 strains include a complete tear of the tendon or muscle, and function is usually impaired. °SYMPTOMS  °· Pain around the shoulder, often at the outer portion of the upper arm. °· Pain that gets worse with shoulder function, especially when reaching overhead or lifting. °· Sometimes, aching when not using the arm. °· Pain that wakes you up at night. °· Sometimes, tenderness, swelling, warmth, or redness over the affected area. °· Loss of strength. °· Limited motion of the shoulder, especially reaching behind the back (to the back pocket or to unhook bra) or across your body. °· Crackling sound (crepitation) when moving the arm. °· Biceps tendon pain and inflammation (in the front of the shoulder). Worse when bending the elbow or lifting. °CAUSES  °Impingement syndrome is often an overuse injury, in which chronic (repetitive) motions cause the tendons or bursa to become inflamed. A strain occurs when a force is paced on the tendon or muscle that is greater than it can withstand.  Common mechanisms of injury include: °Stress from sudden increase in duration, frequency, or intensity of training. °· Direct hit (trauma) to the shoulder. °· Aging, erosion of the tendon with normal use. °· Bony bump on shoulder (acromial spur). °RISK INCREASES WITH: °· Contact sports (football, wrestling, boxing). °· Throwing sports (baseball, tennis, volleyball). °· Weightlifting and bodybuilding. °· Heavy labor. °· Previous injury to the rotator cuff, including impingement. °· Poor shoulder strength and flexibility. °· Failure to warm up properly before activity. °· Inadequate protective equipment. °· Old age. °· Bony bump on shoulder (acromial spur). °PREVENTION  °· Warm up and stretch properly before activity. °· Allow for adequate recovery between workouts. °· Maintain physical fitness: °¨ Strength, flexibility, and endurance. °¨ Cardiovascular fitness. °· Learn and use proper exercise technique. °PROGNOSIS  °If treated properly, impingement syndrome usually goes away within 6 weeks. Sometimes surgery is required.  °RELATED COMPLICATIONS  °· Longer healing time if not properly treated, or if not given enough time to heal. °· Recurring symptoms, that result in a chronic condition. °· Shoulder stiffness, frozen shoulder, or loss of motion. °· Rotator cuff tendon tear. °· Recurring symptoms, especially if activity is resumed too soon, with overuse, with a direct blow, or when using poor technique. °TREATMENT  °Treatment first involves the use of ice and medicine, to reduce pain and inflammation. The use of strengthening and stretching exercises may help reduce pain with activity. These exercises may be performed at home or with a therapist. If non-surgical treatment is unsuccessful after more than 6 months, surgery may be advised. After surgery and rehabilitation, activity is usually possible in 3 months.  °  MEDICATION  If pain medicine is needed, nonsteroidal anti-inflammatory medicines (aspirin and  ibuprofen), or other minor pain relievers (acetaminophen), are often advised.  Do not take pain medicine for 7 days before surgery.  Prescription pain relievers may be given, if your caregiver thinks they are needed. Use only as directed and only as much as you need.  Corticosteroid injections may be given by your caregiver. These injections should be reserved for the most serious cases, because they may only be given a certain number of times. HEAT AND COLD  Cold treatment (icing) should be applied for 10 to 15 minutes every 2 to 3 hours for inflammation and pain, and immediately after activity that aggravates your symptoms. Use ice packs or an ice massage.  Heat treatment may be used before performing stretching and strengthening activities prescribed by your caregiver, physical therapist, or athletic trainer. Use a heat pack or a warm water soak. SEEK MEDICAL CARE IF:   Symptoms get worse or do not improve in 4 to 6 weeks, despite treatment.  New, unexplained symptoms develop. (Drugs used in treatment may produce side effects.) EXERCISES  RANGE OF MOTION (ROM) AND STRETCHING EXERCISES - Impingement Syndrome (Rotator Cuff  Tendinitis, Bursitis) These exercises may help you when beginning to rehabilitate your injury. Your symptoms may go away with or without further involvement from your physician, physical therapist or athletic trainer. While completing these exercises, remember:   Restoring tissue flexibility helps normal motion to return to the joints. This allows healthier, less painful movement and activity.  An effective stretch should be held for at least 30 seconds.  A stretch should never be painful. You should only feel a gentle lengthening or release in the stretched tissue. STRETCH - Flexion, Standing  Stand with good posture. With an underhand grip on your right / left hand, and an overhand grip on the opposite hand, grasp a broomstick or cane so that your hands are a  little more than shoulder width apart.  Keeping your right / left elbow straight and shoulder muscles relaxed, push the stick with your opposite hand, to raise your right / left arm in front of your body and then overhead. Raise your arm until you feel a stretch in your right / left shoulder, but before you have increased shoulder pain.  Try to avoid shrugging your right / left shoulder as your arm rises, by keeping your shoulder blade tucked down and toward your mid-back spine. Hold for __________ seconds.  Slowly return to the starting position. Repeat __________ times. Complete this exercise __________ times per day. STRETCH - Abduction, Supine  Lie on your back. With an underhand grip on your right / left hand and an overhand grip on the opposite hand, grasp a broomstick or cane so that your hands are a little more than shoulder width apart.  Keeping your right / left elbow straight and your shoulder muscles relaxed, push the stick with your opposite hand, to raise your right / left arm out to the side of your body and then overhead. Raise your arm until you feel a stretch in your right / left shoulder, but before you have increased shoulder pain.  Try to avoid shrugging your right / left shoulder as your arm rises, by keeping your shoulder blade tucked down and toward your mid-back spine. Hold for __________ seconds.  Slowly return to the starting position. Repeat __________ times. Complete this exercise __________ times per day. ROM - Flexion, Active-Assisted  Lie on your back.  You may bend your knees for comfort.  Grasp a broomstick or cane so your hands are about shoulder width apart. Your right / left hand should grip the end of the stick, so that your hand is positioned "thumbs-up," as if you were about to shake hands.  Using your healthy arm to lead, raise your right / left arm overhead, until you feel a gentle stretch in your shoulder. Hold for __________ seconds.  Use the stick  to assist in returning your right / left arm to its starting position. Repeat __________ times. Complete this exercise __________ times per day.  ROM - Internal Rotation, Supine   Lie on your back on a firm surface. Place your right / left elbow about 60 degrees away from your side. Elevate your elbow with a folded towel, so that the elbow and shoulder are the same height.  Using a broomstick or cane and your strong arm, pull your right / left hand toward your body until you feel a gentle stretch, but no increase in your shoulder pain. Keep your shoulder and elbow in place throughout the exercise.  Hold for __________ seconds. Slowly return to the starting position. Repeat __________ times. Complete this exercise __________ times per day. STRETCH - Internal Rotation  Place your right / left hand behind your back, palm up.  Throw a towel or belt over your opposite shoulder. Grasp the towel with your right / left hand.  While keeping an upright posture, gently pull up on the towel, until you feel a stretch in the front of your right / left shoulder.  Avoid shrugging your right / left shoulder as your arm rises, by keeping your shoulder blade tucked down and toward your mid-back spine.  Hold for __________ seconds. Release the stretch, by lowering your healthy hand. Repeat __________ times. Complete this exercise __________ times per day. ROM - Internal Rotation   Using an underhand grip, grasp a stick behind your back with both hands.  While standing upright with good posture, slide the stick up your back until you feel a mild stretch in the front of your shoulder.  Hold for __________ seconds. Slowly return to your starting position. Repeat __________ times. Complete this exercise __________ times per day.  STRETCH - Posterior Shoulder Capsule   Stand or sit with good posture. Grasp your right / left elbow and draw it across your chest, keeping it at the same height as your  shoulder.  Pull your elbow, so your upper arm comes in closer to your chest. Pull until you feel a gentle stretch in the back of your shoulder.  Hold for __________ seconds. Repeat __________ times. Complete this exercise __________ times per day. STRENGTHENING EXERCISES - Impingement Syndrome (Rotator Cuff Tendinitis, Bursitis) These exercises may help you when beginning to rehabilitate your injury. They may resolve your symptoms with or without further involvement from your physician, physical therapist or athletic trainer. While completing these exercises, remember:  Muscles can gain both the endurance and the strength needed for everyday activities through controlled exercises.  Complete these exercises as instructed by your physician, physical therapist or athletic trainer. Increase the resistance and repetitions only as guided.  You may experience muscle soreness or fatigue, but the pain or discomfort you are trying to eliminate should never worsen during these exercises. If this pain does get worse, stop and make sure you are following the directions exactly. If the pain is still present after adjustments, discontinue the exercise until you can discuss   the trouble with your clinician.  During your recovery, avoid activity or exercises which involve actions that place your injured hand or elbow above your head or behind your back or head. These positions stress the tissues which you are trying to heal. STRENGTH - Scapular Depression and Adduction   With good posture, sit on a firm chair. Support your arms in front of you, with pillows, arm rests, or on a table top. Have your elbows in line with the sides of your body.  Gently draw your shoulder blades down and toward your mid-back spine. Gradually increase the tension, without tensing the muscles along the top of your shoulders and the back of your neck.  Hold for __________ seconds. Slowly release the tension and relax your muscles  completely before starting the next repetition.  After you have practiced this exercise, remove the arm support and complete the exercise in standing as well as sitting position. Repeat __________ times. Complete this exercise __________ times per day.  STRENGTH - Shoulder Abductors, Isometric  With good posture, stand or sit about 4-6 inches from a Nephew, with your right / left side facing the Clopper.  Bend your right / left elbow. Gently press your right / left elbow into the [place]. Increase the pressure gradually, until you are pressing as hard as you can, without shrugging your shoulder or increasing any shoulder discomfort.  Hold for __________ seconds.  Release the tension slowly. Relax your shoulder muscles completely before you begin the next repetition. Repeat __________ times. Complete this exercise __________ times per day.  STRENGTH - External Rotators, Isometric  Keep your right / left elbow at your side and bend it 90 degrees.  Step into a door frame so that the outside of your right / left wrist can press against the door frame without your upper arm leaving your side.  Gently press your right / left wrist into the door frame, as if you were trying to swing the back of your hand away from your stomach. Gradually increase the tension, until you are pressing as hard as you can, without shrugging your shoulder or increasing any shoulder discomfort.  Hold for __________ seconds.  Release the tension slowly. Relax your shoulder muscles completely before you begin the next repetition. Repeat __________ times. Complete this exercise __________ times per day.  STRENGTH - Supraspinatus   Stand or sit with good posture. Grasp a __________ weight, or an exercise band or tubing, so that your hand is "thumbs-up," like you are shaking hands.  Slowly lift your right / left arm in a "V" away from your thigh, diagonally into the space between your side and straight ahead. Lift your hand to  shoulder height or as far as you can, without increasing any shoulder pain. At first, many people do not lift their hands above shoulder height.  Avoid shrugging your right / left shoulder as your arm rises, by keeping your shoulder blade tucked down and toward your mid-back spine.  Hold for __________ seconds. Control the descent of your hand, as you slowly return to your starting position. Repeat __________ times. Complete this exercise __________ times per day.  STRENGTH - External Rotators  Secure a rubber exercise band or tubing to a fixed object (table, pole) so that it is at the same height as your right / left elbow when you are standing or sitting on a firm surface.  Stand or sit so that the secured exercise band is at your uninjured side.  Bend   your right / left elbow 90 degrees. Place a folded towel or small pillow under your right / left arm, so that your elbow is a few inches away from your side.  Keeping the tension on the exercise band, pull it away from your body, as if pivoting on your elbow. Be sure to keep your body steady, so that the movement is coming only from your rotating shoulder.  Hold for __________ seconds. Release the tension in a controlled manner, as you return to the starting position. Repeat __________ times. Complete this exercise __________ times per day.  STRENGTH - Internal Rotators   Secure a rubber exercise band or tubing to a fixed object (table, pole) so that it is at the same height as your right / left elbow when you are standing or sitting on a firm surface.  Stand or sit so that the secured exercise band is at your right / left side.  Bend your elbow 90 degrees. Place a folded towel or small pillow under your right / left arm so that your elbow is a few inches away from your side.  Keeping the tension on the exercise band, pull it across your body, toward your stomach. Be sure to keep your body steady, so that the movement is coming only from  your rotating shoulder.  Hold for __________ seconds. Release the tension in a controlled manner, as you return to the starting position. Repeat __________ times. Complete this exercise __________ times per day.  STRENGTH - Scapular Protractors, Standing   Stand arms length away from a Camacho. Place your hands on the Cart, keeping your elbows straight.  Begin by dropping your shoulder blades down and toward your mid-back spine.  To strengthen your protractors, keep your shoulder blades down, but slide them forward on your rib cage. It will feel as if you are lifting the back of your rib cage away from the Kring. This is a subtle motion and can be challenging to complete. Ask your caregiver for further instruction, if you are not sure you are doing the exercise correctly.  Hold for __________ seconds. Slowly return to the starting position, resting the muscles completely before starting the next repetition. Repeat __________ times. Complete this exercise __________ times per day. STRENGTH - Scapular Protractors, Supine  Lie on your back on a firm surface. Extend your right / left arm straight into the air while holding a __________ weight in your hand.  Keeping your head and back in place, lift your shoulder off the floor.  Hold for __________ seconds. Slowly return to the starting position, and allow your muscles to relax completely before starting the next repetition. Repeat __________ times. Complete this exercise __________ times per day. STRENGTH - Scapular Protractors, Quadruped  Get onto your hands and knees, with your shoulders directly over your hands (or as close as you can be, comfortably).  Keeping your elbows locked, lift the back of your rib cage up into your shoulder blades, so your mid-back rounds out. Keep your neck muscles relaxed.  Hold this position for __________ seconds. Slowly return to the starting position and allow your muscles to relax completely before starting the  next repetition. Repeat __________ times. Complete this exercise __________ times per day.  STRENGTH - Scapular Retractors  Secure a rubber exercise band or tubing to a fixed object (table, pole), so that it is at the height of your shoulders when you are either standing, or sitting on a firm armless chair.  With a   palm down grip, grasp an end of the band in each hand. Straighten your elbows and lift your hands straight in front of you, at shoulder height. Step back, away from the secured end of the band, until it becomes tense.  Squeezing your shoulder blades together, draw your elbows back toward your sides, as you bend them. Keep your upper arms lifted away from your body throughout the exercise.  Hold for __________ seconds. Slowly ease the tension on the band, as you reverse the directions and return to the starting position. Repeat __________ times. Complete this exercise __________ times per day. STRENGTH - Shoulder Extensors   Secure a rubber exercise band or tubing to a fixed object (table, pole) so that it is at the height of your shoulders when you are either standing, or sitting on a firm armless chair.  With a thumbs-up grip, grasp an end of the band in each hand. Straighten your elbows and lift your hands straight in front of you, at shoulder height. Step back, away from the secured end of the band, until it becomes tense.  Squeezing your shoulder blades together, pull your hands down to the sides of your thighs. Do not allow your hands to go behind you.  Hold for __________ seconds. Slowly ease the tension on the band, as you reverse the directions and return to the starting position. Repeat __________ times. Complete this exercise __________ times per day.  STRENGTH - Scapular Retractors and External Rotators   Secure a rubber exercise band or tubing to a fixed object (table, pole) so that it is at the height as your shoulders, when you are either standing, or sitting on a  firm armless chair.  With a palm down grip, grasp an end of the band in each hand. Bend your elbows 90 degrees and lift your elbows to shoulder height, at your sides. Step back, away from the secured end of the band, until it becomes tense.  Squeezing your shoulder blades together, rotate your shoulders so that your upper arms and elbows remain stationary, but your fists travel upward to head height.  Hold for __________ seconds. Slowly ease the tension on the band, as you reverse the directions and return to the starting position. Repeat __________ times. Complete this exercise __________ times per day.  STRENGTH - Scapular Retractors and External Rotators, Rowing   Secure a rubber exercise band or tubing to a fixed object (table, pole) so that it is at the height of your shoulders, when you are either standing, or sitting on a firm armless chair.  With a palm down grip, grasp an end of the band in each hand. Straighten your elbows and lift your hands straight in front of you, at shoulder height. Step back, away from the secured end of the band, until it becomes tense.  Step 1: Squeeze your shoulder blades together. Bending your elbows, draw your hands to your chest, as if you are rowing a boat. At the end of this motion, your hands and elbow should be at shoulder height and your elbows should be out to your sides.  Step 2: Rotate your shoulders, to raise your hands above your head. Your forearms should be vertical and your upper arms should be horizontal.  Hold for __________ seconds. Slowly ease the tension on the band, as you reverse the directions and return to the starting position. Repeat __________ times. Complete this exercise __________ times per day.  STRENGTH - Scapular Depressors  Find a sturdy chair   without wheels, such as a dining room chair.  Keeping your feet on the floor, and your hands on the chair arms, lift your bottom up from the seat, and lock your elbows.  Keeping  your elbows straight, allow gravity to pull your body weight down. Your shoulders will rise toward your ears.  Raise your body against gravity by drawing your shoulder blades down your back, shortening the distance between your shoulders and ears. Although your feet should always maintain contact with the floor, your feet should progressively support less body weight, as you get stronger.  Hold for __________ seconds. In a controlled and slow manner, lower your body weight to begin the next repetition. Repeat __________ times. Complete this exercise __________ times per day.    This information is not intended to replace advice given to you by your health care provider. Make sure you discuss any questions you have with your health care provider.   Document Released: 11/17/2005 Document Revised: 12/08/2014 Document Reviewed: 03/01/2009 Elsevier Interactive Patient Education 2016 [employer]. n

## 2016-08-20 NOTE — Telephone Encounter (Signed)
Routing to provider  

## 2016-08-20 NOTE — Assessment & Plan Note (Signed)
Keep covered.  Dressed today.  Recheck in 1 week.  May need colloid dressing and/or wound clinic.  Support hose

## 2016-08-20 NOTE — Telephone Encounter (Signed)
Pharmacy called and would like to know if mupirocin cream (BACTROBAN) 2 % could be changed to the ointment because it is less expensive.

## 2016-08-20 NOTE — Assessment & Plan Note (Signed)
Meloxicam and exercises.  Pt ed

## 2016-08-21 LAB — THYROID PANEL WITH TSH
FREE THYROXINE INDEX: 1.6 (ref 1.2–4.9)
T3 Uptake Ratio: 27 % (ref 24–39)
T4 TOTAL: 6.1 ug/dL (ref 4.5–12.0)
TSH: 2.84 u[IU]/mL (ref 0.450–4.500)

## 2016-08-27 ENCOUNTER — Ambulatory Visit (INDEPENDENT_AMBULATORY_CARE_PROVIDER_SITE_OTHER): Payer: PPO | Admitting: Unknown Physician Specialty

## 2016-08-27 ENCOUNTER — Encounter: Payer: Self-pay | Admitting: Unknown Physician Specialty

## 2016-08-27 DIAGNOSIS — S81801D Unspecified open wound, right lower leg, subsequent encounter: Secondary | ICD-10-CM

## 2016-08-27 NOTE — Assessment & Plan Note (Signed)
Discussed with Merrie Roof PA.  Will continue to monitor.  Will change to Silvadene with BID dressing changes.  Consider UNA boot.

## 2016-08-27 NOTE — Progress Notes (Signed)
   BP 114/68 (BP Location: Left Arm, Patient Position: Sitting, Cuff Size: Small)   Pulse 87   Temp 98.6 F (37 C)   Wt 111 lb 6.4 oz (50.5 kg)   SpO2 98%   BMI 20.51 kg/m    Subjective:    Patient ID: Kayla Johnson, female    DOB: 1946/06/11, 70 y.o.   MRN: [id number]  HPI: Kayla Johnson is a 70 y.o. female  Chief Complaint  Patient presents with  . Wound Check    pt states she is here for a wound check, states the ointment has been helping    Pt feels wound is better.  It is still tender but surrounding tissue is improved.    Relevant past medical, surgical, family and social history reviewed and updated as indicated. Interim medical history since our last visit reviewed. Allergies and medications reviewed and updated.  Review of Systems  Per HPI unless specifically indicated above     Objective:    BP 114/68 (BP Location: Left Arm, Patient Position: Sitting, Cuff Size: Small)   Pulse 87   Temp 98.6 F (37 C)   Wt 111 lb 6.4 oz (50.5 kg)   SpO2 98%   BMI 20.51 kg/m   Wt Readings from Last 3 Encounters:  08/27/16 111 lb 6.4 oz (50.5 kg)  08/20/16 110 lb (49.9 kg)  12/12/15 106 lb 3.2 oz (48.2 kg)    Physical Exam  Constitutional: She is oriented to person, place, and time. She appears well-developed and well-nourished. No distress.  HENT:  Head: Normocephalic and atraumatic.  Eyes: Conjunctivae and lids are normal. Right eye exhibits no discharge. Left eye exhibits no discharge. No scleral icterus.  Abdominal: Normal appearance. There is no splenomegaly or hepatomegaly.  Musculoskeletal: Normal range of motion.  Neurological: She is alert and oriented to person, place, and time.  Skin: Skin is intact. No rash noted. No pallor.     Ulcer lower leg with decreased redness  Psychiatric: She has a normal mood and affect. Her behavior is normal. Judgment and thought content normal.    Results for orders placed or performed in visit on 08/20/16  Thyroid Panel With  TSH  Result Value Ref Range   TSH 2.840 0.450 - 4.500 uIU/mL   T4, Total 6.1 4.5 - 12.0 ug/dL   T3 Uptake Ratio 27 24 - 39 %   Free Thyroxine Index 1.6 1.2 - 4.9      Assessment & Plan:   Problem List Items Addressed This Visit      Unprioritized   Leg wound, right    Discussed with Merrie Roof PA.  Will continue to monitor.  Will change to Silvadene with BID dressing changes.  Consider UNA boot.       Other Visit Diagnoses   None.      Follow up plan: Return in about 1 week (around 09/03/2016).

## 2016-09-03 ENCOUNTER — Ambulatory Visit (INDEPENDENT_AMBULATORY_CARE_PROVIDER_SITE_OTHER): Payer: PPO | Admitting: Unknown Physician Specialty

## 2016-09-03 ENCOUNTER — Encounter: Payer: Self-pay | Admitting: Unknown Physician Specialty

## 2016-09-03 DIAGNOSIS — M7551 Bursitis of right shoulder: Secondary | ICD-10-CM

## 2016-09-03 MED ORDER — MELOXICAM 15 MG PO TABS: 15.0000 mg | ORAL_TABLET | Freq: Every day | ORAL | 0 refills | Status: DC

## 2016-09-03 NOTE — Progress Notes (Signed)
   BP 120/74 (BP Location: Left Arm, Patient Position: Sitting, Cuff Size: Small)   Pulse 78   Temp 98.5 F (36.9 C)   Wt 110 lb 6.4 oz (50.1 kg)   SpO2 95%   BMI 20.32 kg/m    Subjective:    Patient ID: Kayla Johnson, female    DOB: [birth year], 70 y.o.   MRN: [id number]  HPI: Kayla Johnson is a 70 y.o. female  Chief Complaint  Patient presents with  . Wound Check    lower, right leg  . Medication Refill    pt states she would like a refill on meloxicam, states it is really helping her arms    Pt is here for right lower leg ulcer.  This seems to be getting better with Silvadene  She states right arm pain is improved.    elevant past medical, surgical, family and social history reviewed and updated as indicated. Interim medical history since our last visit reviewed. Allergies and medications reviewed and updated.  Review of Systems  Per HPI unless specifically indicated above     Objective:    BP 120/74 (BP Location: Left Arm, Patient Position: Sitting, Cuff Size: Small)   Pulse 78   Temp 98.5 F (36.9 C)   Wt 110 lb 6.4 oz (50.1 kg)   SpO2 95%   BMI 20.32 kg/m   Wt Readings from Last 3 Encounters:  09/03/16 110 lb 6.4 oz (50.1 kg)  08/27/16 111 lb 6.4 oz (50.5 kg)  08/20/16 110 lb (49.9 kg)    Physical Exam  Constitutional: She is oriented to person, place, and time. She appears well-developed and well-nourished. No distress.  HENT:  Head: Normocephalic and atraumatic.  Eyes: Conjunctivae and lids are normal. Right eye exhibits no discharge. Left eye exhibits no discharge. No scleral icterus.  Cardiovascular: Normal rate.   Pulmonary/Chest: Effort normal.  Abdominal: Normal appearance. There is no splenomegaly or hepatomegaly.  Musculoskeletal: Normal range of motion.  Neurological: She is alert and oriented to person, place, and time.  Skin: Skin is intact. No rash noted. No pallor.  Right lateral ulcer is smaller and decreased erythema  Psychiatric: She  has a normal mood and affect. Her behavior is normal. Judgment and thought content normal.    Results for orders placed or performed in visit on 08/27/16  HM MAMMOGRAPHY  Result Value Ref Range   HM Mammogram 0-4 Bi-Rad 0-4 Bi-Rad, Self Reported Normal  HM MAMMOGRAPHY  Result Value Ref Range   HM Mammogram 0-4 Bi-Rad 0-4 Bi-Rad, Self Reported Normal      Assessment & Plan:   Problem List Items Addressed This Visit      Unprioritized   Acute bursitis of right shoulder   Relevant Medications   meloxicam (MOBIC) 15 MG tablet    Other Visit Diagnoses   None.      Follow up plan: Return in about 2 weeks (around 09/17/2016) for Plus physical.

## 2016-09-17 ENCOUNTER — Ambulatory Visit: Payer: PPO | Admitting: Unknown Physician Specialty

## 2016-09-17 ENCOUNTER — Ambulatory Visit (INDEPENDENT_AMBULATORY_CARE_PROVIDER_SITE_OTHER): Payer: PPO

## 2016-09-17 DIAGNOSIS — E538 Deficiency of other specified B group vitamins: Secondary | ICD-10-CM

## 2016-09-17 DIAGNOSIS — D510 Vitamin B12 deficiency anemia due to intrinsic factor deficiency: Secondary | ICD-10-CM | POA: Diagnosis not present

## 2016-10-15 ENCOUNTER — Ambulatory Visit (INDEPENDENT_AMBULATORY_CARE_PROVIDER_SITE_OTHER): Payer: PPO

## 2016-10-15 DIAGNOSIS — D510 Vitamin B12 deficiency anemia due to intrinsic factor deficiency: Secondary | ICD-10-CM

## 2016-10-15 DIAGNOSIS — E538 Deficiency of other specified B group vitamins: Secondary | ICD-10-CM | POA: Diagnosis not present

## 2016-11-12 ENCOUNTER — Ambulatory Visit: Payer: PPO

## 2016-11-12 ENCOUNTER — Ambulatory Visit (INDEPENDENT_AMBULATORY_CARE_PROVIDER_SITE_OTHER): Payer: PPO

## 2016-11-12 DIAGNOSIS — E538 Deficiency of other specified B group vitamins: Secondary | ICD-10-CM

## 2016-11-12 DIAGNOSIS — D510 Vitamin B12 deficiency anemia due to intrinsic factor deficiency: Secondary | ICD-10-CM

## 2016-12-17 ENCOUNTER — Ambulatory Visit: Payer: Medicare HMO

## 2016-12-31 ENCOUNTER — Encounter: Payer: Self-pay | Admitting: Unknown Physician Specialty

## 2016-12-31 ENCOUNTER — Ambulatory Visit (INDEPENDENT_AMBULATORY_CARE_PROVIDER_SITE_OTHER): Payer: Medicare HMO | Admitting: Unknown Physician Specialty

## 2016-12-31 ENCOUNTER — Telehealth: Payer: Self-pay

## 2016-12-31 VITALS — BP 125/72 | HR 77 | Temp 98.3°F | Ht 62.3 in | Wt 108.2 lb

## 2016-12-31 DIAGNOSIS — N904 Leukoplakia of vulva: Secondary | ICD-10-CM | POA: Diagnosis not present

## 2016-12-31 DIAGNOSIS — R5383 Other fatigue: Secondary | ICD-10-CM | POA: Diagnosis not present

## 2016-12-31 DIAGNOSIS — D510 Vitamin B12 deficiency anemia due to intrinsic factor deficiency: Secondary | ICD-10-CM

## 2016-12-31 DIAGNOSIS — M7551 Bursitis of right shoulder: Secondary | ICD-10-CM | POA: Diagnosis not present

## 2016-12-31 DIAGNOSIS — Z0000 Encounter for general adult medical examination without abnormal findings: Secondary | ICD-10-CM

## 2016-12-31 MED ORDER — CLOBETASOL PROPIONATE 0.05 % EX OINT: 1.0000 "application " | TOPICAL_OINTMENT | Freq: Two times a day (BID) | CUTANEOUS | 0 refills | Status: DC

## 2016-12-31 NOTE — Progress Notes (Signed)
BP 125/72 (BP Location: Left Arm, Patient Position: Sitting, Cuff Size: Normal)   Pulse 77   Temp 98.3 F (36.8 C)   Ht 5' 2.3" (1.582 m)   Wt 108 lb 3.2 oz (49.1 kg)   SpO2 98%   BMI 19.60 kg/m    Subjective:    Patient ID: Kayla Johnson, female    DOB: Nov 21, 1946, 71 y.o.   MRN: [id number]  HPI: Kayla Johnson is a 71 y.o. female  Chief Complaint  Patient presents with  . Medicare Wellness    pt states she would like iron checked  . Vaginal Itching    pt states she has had vaginal itching for a few years, states it comes and goes    Social History   Social History  . Marital status: Widowed    Spouse name: N/A  . Number of children: N/A  . Years of education: N/A   Social History Main Topics  . Smoking status: Never Smoker  . Smokeless tobacco: Never Used  . Alcohol use No  . Drug use: No  . Sexual activity: Not Asked   Other Topics Concern  . None   Social History Narrative  . None   Family History  Problem Relation Age of Onset  . Arthritis Mother   . Diabetes Mother   . Arthritis Father   . Diabetes Sister   . Stroke Sister   . Cancer Sister     ovarian  . Diabetes Sister    History reviewed. No pertinent past medical history.  Past Surgical History:  Procedure Laterality Date  . Molar Pregnancy     S/P D & C  . TONSILLECTOMY    . Tubes & Ovaries removed  2015      Relevant past medical, surgical, family and social history reviewed and updated as indicated. Interim medical history since our last visit reviewed. Allergies and medications reviewed and updated.  Review of Systems  Constitutional: Negative.        Fatigue at times  HENT: Negative.   Eyes: Negative.   Respiratory: Negative.   Cardiovascular: Negative.   Gastrointestinal: Negative.   Endocrine: Negative.   Genitourinary: Negative.        Pt with intermittent vaginal itching and would like a refill of Clobetasol that she uses on occasion  Musculoskeletal:       Continued  right shoulder pain  Skin: Negative.   Allergic/Immunologic: Negative.   Neurological: Negative.   Hematological: Negative.   Psychiatric/Behavioral: Negative.     Per HPI unless specifically indicated above     Objective:    BP 125/72 (BP Location: Left Arm, Patient Position: Sitting, Cuff Size: Normal)   Pulse 77   Temp 98.3 F (36.8 C)   Ht 5' 2.3" (1.582 m)   Wt 108 lb 3.2 oz (49.1 kg)   SpO2 98%   BMI 19.60 kg/m   Wt Readings from Last 3 Encounters:  12/31/16 108 lb 3.2 oz (49.1 kg)  09/03/16 110 lb 6.4 oz (50.1 kg)  08/27/16 111 lb 6.4 oz (50.5 kg)    Physical Exam  Constitutional: She is oriented to person, place, and time. She appears well-developed and well-nourished.  HENT:  Head: Normocephalic and atraumatic.  Eyes: Pupils are equal, round, and reactive to light. Right eye exhibits no discharge. Left eye exhibits no discharge. No scleral icterus.  Neck: Normal range of motion. Neck supple. Carotid bruit is not present. No thyromegaly present.  Cardiovascular: Normal rate,  regular rhythm and normal heart sounds.  Exam reveals no gallop and no friction rub.   No murmur heard. Pulmonary/Chest: Effort normal and breath sounds normal. No respiratory distress. She has no wheezes. She has no rales.  Abdominal: Soft. Bowel sounds are normal. There is no tenderness. There is no rebound.  Genitourinary: No breast swelling, tenderness or discharge.  Musculoskeletal: Normal range of motion.  Lymphadenopathy:    She has no cervical adenopathy.  Neurological: She is alert and oriented to person, place, and time.  Skin: Skin is warm, dry and intact. No rash noted.  Psychiatric: She has a normal mood and affect. Her speech is normal and behavior is normal. Judgment and thought content normal. Cognition and memory are normal.      Assessment & Plan:   Problem List Items Addressed This Visit      Unprioritized   Chronic shoulder bursitis, right    She plans to check  insurance to see about PT coverage      Fatigue   Relevant Orders   CBC with Differential/Platelet   Lichen sclerosus of female genitalia   Relevant Medications   clobetasol ointment (TEMOVATE) 0.05 %   Pernicious anemia    Continue B12      Relevant Orders   CBC with Differential/Platelet       Follow up plan: No Follow-up on file.

## 2016-12-31 NOTE — Telephone Encounter (Signed)
Pharmacy called and wanted to know if they could change the clobetasol prescription from the ointment to the cream because it is cheaper. Placed them on hold and asked provider. Malachy Mood said this was OK to do so I let the pharmacy know.

## 2016-12-31 NOTE — Assessment & Plan Note (Signed)
Continue B12. 

## 2016-12-31 NOTE — Assessment & Plan Note (Signed)
She plans to check insurance to see about PT coverage

## 2017-01-01 ENCOUNTER — Other Ambulatory Visit: Payer: Self-pay | Admitting: Unknown Physician Specialty

## 2017-01-01 DIAGNOSIS — D649 Anemia, unspecified: Secondary | ICD-10-CM

## 2017-01-01 LAB — CBC WITH DIFFERENTIAL/PLATELET
Basophils Absolute: 0 10*3/uL (ref 0.0–0.2)
Basos: 1 %
EOS (ABSOLUTE): 0.1 10*3/uL (ref 0.0–0.4)
EOS: 3 %
HEMATOCRIT: 33.6 % — AB (ref 34.0–46.6)
Hemoglobin: 10.8 g/dL — ABNORMAL LOW (ref 11.1–15.9)
IMMATURE GRANULOCYTES: 0 %
Immature Grans (Abs): 0 10*3/uL (ref 0.0–0.1)
LYMPHS ABS: 1.7 10*3/uL (ref 0.7–3.1)
Lymphs: 38 %
MCH: 25.4 pg — ABNORMAL LOW (ref 26.6–33.0)
MCHC: 32.1 g/dL (ref 31.5–35.7)
MCV: 79 fL (ref 79–97)
MONOS ABS: 0.4 10*3/uL (ref 0.1–0.9)
Monocytes: 9 %
NEUTROS PCT: 49 %
Neutrophils Absolute: 2.2 10*3/uL (ref 1.4–7.0)
PLATELETS: 336 10*3/uL (ref 150–379)
RBC: 4.26 x10E6/uL (ref 3.77–5.28)
RDW: 17.4 % — AB (ref 12.3–15.4)
WBC: 4.5 10*3/uL (ref 3.4–10.8)

## 2017-01-12 ENCOUNTER — Telehealth: Payer: Self-pay | Admitting: Unknown Physician Specialty

## 2017-01-12 DIAGNOSIS — D649 Anemia, unspecified: Secondary | ICD-10-CM

## 2017-01-12 NOTE — Telephone Encounter (Signed)
Discussed labs.  Needs anemia panel

## 2017-01-14 ENCOUNTER — Other Ambulatory Visit: Payer: Medicare HMO

## 2017-01-14 DIAGNOSIS — D649 Anemia, unspecified: Secondary | ICD-10-CM | POA: Diagnosis not present

## 2017-01-15 LAB — IRON AND TIBC
Iron Saturation: 11 % — ABNORMAL LOW (ref 15–55)
Iron: 37 ug/dL (ref 27–139)
Total Iron Binding Capacity: 326 ug/dL (ref 250–450)
UIBC: 289 ug/dL (ref 118–369)

## 2017-01-15 LAB — FERRITIN: FERRITIN: 35 ng/mL (ref 15–150)

## 2017-01-15 LAB — VITAMIN B12: VITAMIN B 12: 556 pg/mL (ref 232–1245)

## 2017-01-15 LAB — FOLATE: Folate: >20 ng/mL (ref >3.0)

## 2017-01-21 DIAGNOSIS — H5051 Esophoria: Secondary | ICD-10-CM | POA: Diagnosis not present

## 2017-01-21 DIAGNOSIS — H5213 Myopia, bilateral: Secondary | ICD-10-CM | POA: Diagnosis not present

## 2017-01-21 DIAGNOSIS — H1851 Endothelial corneal dystrophy: Secondary | ICD-10-CM | POA: Diagnosis not present

## 2017-01-21 DIAGNOSIS — H25033 Anterior subcapsular polar age-related cataract, bilateral: Secondary | ICD-10-CM | POA: Diagnosis not present

## 2017-01-21 DIAGNOSIS — H2513 Age-related nuclear cataract, bilateral: Secondary | ICD-10-CM | POA: Diagnosis not present

## 2017-01-21 DIAGNOSIS — H524 Presbyopia: Secondary | ICD-10-CM | POA: Diagnosis not present

## 2017-02-04 ENCOUNTER — Ambulatory Visit (INDEPENDENT_AMBULATORY_CARE_PROVIDER_SITE_OTHER): Payer: Medicare HMO

## 2017-02-04 DIAGNOSIS — D510 Vitamin B12 deficiency anemia due to intrinsic factor deficiency: Secondary | ICD-10-CM

## 2017-02-06 DIAGNOSIS — Z0100 Encounter for examination of eyes and vision without abnormal findings: Secondary | ICD-10-CM | POA: Diagnosis not present

## 2017-02-12 ENCOUNTER — Emergency Department
Admission: EM | Admit: 2017-02-12 | Discharge: 2017-02-13 | Disposition: A | Discharge status: Home / Self Care | Payer: Medicare HMO | Attending: Emergency Medicine | Admitting: Emergency Medicine

## 2017-02-12 ENCOUNTER — Encounter: Payer: Self-pay | Admitting: Emergency Medicine

## 2017-02-12 DIAGNOSIS — R1084 Generalized abdominal pain: Secondary | ICD-10-CM

## 2017-02-12 DIAGNOSIS — K529 Noninfective gastroenteritis and colitis, unspecified: Secondary | ICD-10-CM | POA: Diagnosis not present

## 2017-02-12 DIAGNOSIS — R1110 Vomiting, unspecified: Secondary | ICD-10-CM | POA: Diagnosis not present

## 2017-02-12 DIAGNOSIS — R109 Unspecified abdominal pain: Secondary | ICD-10-CM | POA: Diagnosis not present

## 2017-02-12 LAB — HEPATIC FUNCTION PANEL
ALK PHOS: 75 U/L (ref 38–126)
ALT: 14 U/L (ref 14–54)
AST: 23 U/L (ref 15–41)
Albumin: 4 g/dL (ref 3.5–5.0)
Bilirubin, Direct: <0.1 mg/dL — ABNORMAL LOW (ref 0.1–0.5)
Total Bilirubin: 0.8 mg/dL (ref 0.3–1.2)
Total Protein: 6.9 g/dL (ref 6.5–8.1)

## 2017-02-12 LAB — CBC
HEMATOCRIT: 36.9 % (ref 35.0–47.0)
HEMOGLOBIN: 12.3 g/dL (ref 12.0–16.0)
MCH: 26.3 pg (ref 26.0–34.0)
MCHC: 33.3 g/dL (ref 32.0–36.0)
MCV: 79 fL — ABNORMAL LOW (ref 80.0–100.0)
Platelets: 307 10*3/uL (ref 150–440)
RBC: 4.67 MIL/uL (ref 3.80–5.20)
RDW: 18.2 % — ABNORMAL HIGH (ref 11.5–14.5)
WBC: 10.3 10*3/uL (ref 3.6–11.0)

## 2017-02-12 LAB — BASIC METABOLIC PANEL
ANION GAP: 6 (ref 5–15)
BUN: 11 mg/dL (ref 6–20)
CALCIUM: 8.7 mg/dL — AB (ref 8.9–10.3)
CO2: 29 mmol/L (ref 22–32)
Chloride: 103 mmol/L (ref 101–111)
Creatinine, Ser: 0.65 mg/dL (ref 0.44–1.00)
GFR calc Af Amer: >60 mL/min (ref >60)
GFR calc non Af Amer: >60 mL/min (ref >60)
GLUCOSE: 133 mg/dL — AB (ref 65–99)
Potassium: 3.8 mmol/L (ref 3.5–5.1)
Sodium: 138 mmol/L (ref 135–145)

## 2017-02-12 LAB — LIPASE, BLOOD: Lipase: 24 U/L (ref 11–51)

## 2017-02-12 LAB — TROPONIN I

## 2017-02-12 MED ORDER — IOPAMIDOL (ISOVUE-300) INJECTION 61%: 30.0000 mL | Freq: Once | INTRAVENOUS | Status: DC

## 2017-02-12 MED ORDER — SODIUM CHLORIDE 0.9 % IV BOLUS (SEPSIS)
1000.0000 mL | Freq: Once | INTRAVENOUS | Status: AC
Start: 2017-02-12 — End: 2017-02-13
  Administered 2017-02-12: 1000 mL via INTRAVENOUS

## 2017-02-12 MED ORDER — ONDANSETRON HCL 4 MG/2ML IJ SOLN
4.0000 mg | Freq: Once | INTRAMUSCULAR | Status: AC
  Administered 2017-02-12: 4 mg via INTRAVENOUS
  Filled 2017-02-12: qty 2

## 2017-02-12 MED ORDER — MORPHINE SULFATE (PF) 4 MG/ML IV SOLN
4.0000 mg | Freq: Once | INTRAVENOUS | Status: AC
  Administered 2017-02-12: 2 mg via INTRAVENOUS
  Filled 2017-02-12: qty 1

## 2017-02-12 NOTE — ED Provider Notes (Signed)
[facility] Emergency Department Provider Note  ____________________________________________   None    (approximate)  I have reviewed the triage vital signs and the nursing notes.   HISTORY  Chief Complaint Emesis and Abdominal Pain    HPI Kayla Johnson is a 71 y.o. female who comes to the emergency department with diffuse moderate to severe cramping abdominal discomfort nausea and vomiting that began about 6 hours prior to arrival. Her only past surgical history is an ovarian cyst removal. She takes no medications.She denies diarrhea. Denies chest pain or shortness of breath.   History reviewed. No pertinent past medical history.  Patient Active Problem List   Diagnosis Date Noted  . Anemia 01/01/2017  . Chronic shoulder bursitis, right 12/31/2016  . Fatigue 12/31/2016  . Lichen sclerosus of female genitalia 12/31/2016  . Bilateral tinnitus 12/12/2015  . Allergic rhinitis 12/12/2015  . Pernicious anemia 10/22/2015    Past Surgical History:  Procedure Laterality Date  . Molar Pregnancy     S/P D & C  . TONSILLECTOMY    . Tubes & Ovaries removed  2015    Prior to Admission medications   Medication Sig Start Date End Date Taking? Authorizing Provider  clobetasol ointment (TEMOVATE) 3.79 % Apply 1 application topically 2 (two) times daily. 12/31/16   Kathrine Haddock, NP  dicyclomine (BENTYL) 20 MG tablet Take 1 tablet (20 mg total) by mouth 3 (three) times daily as needed for spasms. 02/13/17 02/13/18  Darel Hong, MD  ondansetron (ZOFRAN) 4 MG tablet Take 1 tablet (4 mg total) by mouth daily as needed for nausea or vomiting. 02/13/17 02/13/18  Darel Hong, MD    Allergies Patient has no known allergies.  Family History  Problem Relation Age of Onset  . Arthritis Mother   . Diabetes Mother   . Arthritis Father   . Diabetes Sister   . Stroke Sister   . Cancer Sister     ovarian  . Diabetes Sister     Social History Social History    Substance Use Topics  . Smoking status: Never Smoker  . Smokeless tobacco: Never Used  . Alcohol use No    Review of Systems Constitutional: No fever/chills Eyes: No visual changes. ENT: No sore throat. Cardiovascular: Denies chest pain. Respiratory: Denies shortness of breath. Gastrointestinal: Positive abdominal pain.  Positive nausea, no vomiting.  No diarrhea.  No constipation. Genitourinary: Negative for dysuria. Musculoskeletal: Negative for back pain. Skin: Negative for rash. Neurological: Negative for headaches, focal weakness or numbness.  10-point ROS otherwise negative.  ____________________________________________   PHYSICAL EXAM:  VITAL SIGNS: ED Triage Vitals  Enc Vitals Group     BP 02/12/17 2147 125/70     Pulse Rate 02/12/17 2147 (!) 106     Resp 02/12/17 2147 20     Temp 02/12/17 2147 98.7 F (37.1 C)     Temp Source 02/12/17 2147 Oral     SpO2 02/12/17 2147 98 %     Weight 02/12/17 2148 108 lb (49 kg)     Height 02/12/17 2148 5\' 2"  (1.575 m)     Head Circumference --      Peak Flow --      Pain Score 02/12/17 2148 10     Pain Loc --      Pain Edu? --      Excl. in [place]? --     Constitutional: Alert and oriented x 4 well appearing nontoxic no diaphoresis speaks in full, clear  sentences Eyes: PERRL EOMI. Head: Atraumatic. Nose: No congestion/rhinnorhea. Mouth/Throat: No trismus Neck: No stridor.   Cardiovascular: Normal rate, regular rhythm. Grossly normal heart sounds.  Good peripheral circulation. Respiratory: Normal respiratory effort.  No retractions. Lungs CTAB and moving good air Gastrointestinal: Soft nondistended mild diffuse lower abdominal tenderness worse on the left side no rebound no guarding no peritonitis no McBurney's tenderness negative Rovsing's no costovertebral tenderness negative Murphy's Musculoskeletal: No lower extremity edema   Neurologic:  Normal speech and language. No gross focal neurologic deficits are  appreciated. Skin:  Skin is warm, dry and intact. No rash noted. Psychiatric: Mood and affect are normal. Speech and behavior are normal.    ____________________________________________   DIFFERENTIAL  Appendicitis, diverticulitis, ovarian torsion, pyelonephritis, renal colic ____________________________________________   LABS (all labs ordered are listed, but only abnormal results are displayed)  Labs Reviewed  BASIC METABOLIC PANEL - Abnormal; Notable for the following:       Result Value   Glucose, Bld 133 (*)    Calcium 8.7 (*)    All other components within normal limits  CBC - Abnormal; Notable for the following:    MCV 79.0 (*)    RDW 18.2 (*)    All other components within normal limits  HEPATIC FUNCTION PANEL - Abnormal; Notable for the following:    Bilirubin, Direct <0.1 (*)    All other components within normal limits  TROPONIN I  LIPASE, BLOOD    No signs of acute ischemia __________________________________________  EKG  ED ECG REPORT I, Darel Hong, the attending physician, personally viewed and interpreted this ECG.  Date: 02/12/2017 EKG Time:  Rate: 113 Rhythm: normal sinus rhythm QRS Axis: normal Intervals: normal ST/T Wave abnormalities: normal Conduction Disturbances: none Narrative Interpretation: unremarkable, but with very wavy baseline ______________________________________  RADIOLOGY  CT is suggestive of enteritis ____________________________________________   PROCEDURES  Procedure(s) performed: no  Procedures  Critical Care performed: no  ____________________________________________   INITIAL IMPRESSION / ASSESSMENT AND PLAN / ED COURSE  Pertinent labs & imaging results that were available during my care of the patient were reviewed by me and considered in my medical decision making (see chart for details).  The patient arrives tachycardic with left lower quadrant tenderness which is concerning for diverticulitis.  IV established pain medications given antiemetics as well as CT scan will be done.  Fortunately the patient's CT is negative for surgical or bacterial pathology. Likely represents viral enteritis. We'll treat her symptomatically and refer her back to her primary care physician. She is discharged home in improved and good condition.      ____________________________________________   FINAL CLINICAL IMPRESSION(S) / ED DIAGNOSES  Final diagnoses:  Enteritis  Generalized abdominal pain      NEW MEDICATIONS STARTED DURING THIS VISIT:  Discharge Medication List as of 02/13/2017  2:44 AM    START taking these medications   Details  dicyclomine (BENTYL) 20 MG tablet Take 1 tablet (20 mg total) by mouth 3 (three) times daily as needed for spasms., Starting Fri 02/13/2017, Until Sat 02/13/2018, Print    ondansetron (ZOFRAN) 4 MG tablet Take 1 tablet (4 mg total) by mouth daily as needed for nausea or vomiting., Starting Fri 02/13/2017, Until Sat 02/13/2018, Print         Note:  This document was prepared using Dragon voice recognition software and may include unintentional dictation errors.     Darel Hong, MD 02/14/17 [phone]

## 2017-02-13 ENCOUNTER — Encounter: Payer: Self-pay | Admitting: Radiology

## 2017-02-13 ENCOUNTER — Emergency Department: Payer: Medicare HMO

## 2017-02-13 DIAGNOSIS — K529 Noninfective gastroenteritis and colitis, unspecified: Secondary | ICD-10-CM | POA: Diagnosis not present

## 2017-02-13 DIAGNOSIS — R109 Unspecified abdominal pain: Secondary | ICD-10-CM | POA: Diagnosis not present

## 2017-02-13 DIAGNOSIS — R1110 Vomiting, unspecified: Secondary | ICD-10-CM | POA: Diagnosis not present

## 2017-02-13 MED ORDER — ONDANSETRON HCL 4 MG PO TABS: 4.0000 mg | ORAL_TABLET | Freq: Every day | ORAL | 1 refills | Status: AC | PRN

## 2017-02-13 MED ORDER — ONDANSETRON HCL 4 MG/2ML IJ SOLN
4.0000 mg | Freq: Once | INTRAMUSCULAR | Status: AC
  Administered 2017-02-13: 4 mg via INTRAVENOUS
  Filled 2017-02-13: qty 2

## 2017-02-13 MED ORDER — IOPAMIDOL (ISOVUE-300) INJECTION 61%
100.0000 mL | Freq: Once | INTRAVENOUS | Status: AC | PRN
  Administered 2017-02-13: 100 mL via INTRAVENOUS

## 2017-02-13 MED ORDER — DICYCLOMINE HCL 20 MG PO TABS: 20.0000 mg | ORAL_TABLET | Freq: Three times a day (TID) | ORAL | 0 refills | Status: DC | PRN

## 2017-02-13 NOTE — ED Notes (Signed)
Pt r/f CT 

## 2017-02-13 NOTE — Discharge Instructions (Signed)
Please take your pain medication and nausea medication as prescribed. The normal course of your illnesses to be sick for a total of 24-48 more hours. Please return to the emergency department if your pain worsens instead of improves, if you cannot eat or drink, or for any other concerns. It is very important that you follow up with a gastroenterologist in the near future because you are 22 years overdue for your colonoscopy.  It was a pleasure to take care of you today, and thank you for coming to our emergency department.  If you have any questions or concerns before leaving please ask the nurse to grab me and I'm more than happy to go through your aftercare instructions again.  If you were prescribed any opioid pain medication today such as Norco, Vicodin, Percocet, morphine, hydrocodone, or oxycodone please make sure you do not drive when you are taking this medication as it can alter your ability to drive safely.  If you have any concerns once you are home that you are not improving or are in fact getting worse before you can make it to your follow-up appointment, please do not hesitate to call [phone] and come back for further evaluation.  Darel Hong MD  Results for orders placed or performed during the hospital encounter of 95/62/13  Basic metabolic panel  Result Value Ref Range   Sodium 138 135 - 145 mmol/L   Potassium 3.8 3.5 - 5.1 mmol/L   Chloride 103 101 - 111 mmol/L   CO2 29 22 - 32 mmol/L   Glucose, Bld 133 (H) 65 - 99 mg/dL   BUN 11 6 - 20 mg/dL   Creatinine, Ser 0.65 0.44 - 1.00 mg/dL   Calcium 8.7 (L) 8.9 - 10.3 mg/dL   GFR calc non Af Amer >60 >60 mL/min   GFR calc Af Amer >60 >60 mL/min   Anion gap 6 5 - 15  CBC  Result Value Ref Range   WBC 10.3 3.6 - 11.0 K/uL   RBC 4.67 3.80 - 5.20 MIL/uL   Hemoglobin 12.3 12.0 - 16.0 g/dL   HCT 36.9 35.0 - 47.0 %   MCV 79.0 (L) 80.0 - 100.0 fL   MCH 26.3 26.0 - 34.0 pg   MCHC 33.3 32.0 - 36.0 g/dL   RDW 18.2 (H) 11.5 - 14.5 %   Platelets 307 150 - 440 K/uL  Troponin I  Result Value Ref Range   Troponin I <0.03 <0.03 ng/mL  Hepatic function panel  Result Value Ref Range   Total Protein 6.9 6.5 - 8.1 g/dL   Albumin 4.0 3.5 - 5.0 g/dL   AST 23 15 - 41 U/L   ALT 14 14 - 54 U/L   Alkaline Phosphatase 75 38 - 126 U/L   Total Bilirubin 0.8 0.3 - 1.2 mg/dL   Bilirubin, Direct <0.1 (L) 0.1 - 0.5 mg/dL   Indirect Bilirubin NOT CALCULATED 0.3 - 0.9 mg/dL  Lipase, blood  Result Value Ref Range   Lipase 24 11 - 51 U/L   Ct Abdomen Pelvis W Contrast  Result Date: 02/13/2017 CLINICAL DATA:  Generalized abdominal pain and vomiting. Bilateral lower abdominal pain since yesterday. Patient refused oral contrast material. EXAM: CT ABDOMEN AND PELVIS WITH CONTRAST TECHNIQUE: Multidetector CT imaging of the abdomen and pelvis was performed using the standard protocol following bolus administration of intravenous contrast. CONTRAST:  159mL ISOVUE-300 IOPAMIDOL (ISOVUE-300) INJECTION 61% COMPARISON:  05/14/2014 FINDINGS: Lower chest: Evaluation is limited due to motion artifact. Mild  dependent changes in the lung bases. Hepatobiliary: Focal subcentimeter lesion adjacent to the gallbladder fossa probably represents a small hemangioma and is unchanged. Subcentimeter lesion in the lateral segment left lobe may represent a small hemangioma or cyst. Mild periportal edema. This is similar to previous study but more prominent today. This is nonspecific but could indicate inflammatory process such as hepatitis. Cholelithiasis. No inflammatory changes about the gallbladder. No bile duct dilatation. Pancreas: Unremarkable. No pancreatic ductal dilatation or surrounding inflammatory changes. Spleen: Normal in size without focal abnormality. Adrenals/Urinary Tract: Adrenal glands are unremarkable. Kidneys are normal, without renal calculi, focal lesion, or hydronephrosis. Bladder is unremarkable. Stomach/Bowel: Stomach is decompressed, limiting  evaluation. Proximal small bowel are mostly decompressed. Some of the distal small bowel loops are fluid-filled without distention or Chianese thickening. Appearance is nonspecific but could indicate enteritis. Colon is not abnormally distended with scattered stool throughout. No inflammatory changes in the colon. Appendix is normal. Vascular/Lymphatic: Aortic atherosclerosis. No enlarged abdominal or pelvic lymph nodes. Reproductive: Uterus and bilateral adnexa are unremarkable. Other: No abdominal Meyer hernia or abnormality. No abdominopelvic ascites. Musculoskeletal: No acute or significant osseous findings. IMPRESSION: Mild periportal edema in the liver is nonspecific but could indicate inflammatory process. subcentimeter lesions in the liver appear benign and are unchanged. Cholelithiasis without evidence of cholecystitis. Fluid-filled nondistended distal small bowel is nonspecific but could indicate enteritis. No evidence of large or small bowel obstruction. Electronically Signed   By: Lucienne Capers M.D.   On: 02/13/2017 00:30

## 2017-02-13 NOTE — ED Notes (Signed)
Patient transported to CT 

## 2017-03-11 ENCOUNTER — Ambulatory Visit (INDEPENDENT_AMBULATORY_CARE_PROVIDER_SITE_OTHER): Payer: Medicare HMO

## 2017-03-11 DIAGNOSIS — D510 Vitamin B12 deficiency anemia due to intrinsic factor deficiency: Secondary | ICD-10-CM

## 2017-04-15 ENCOUNTER — Ambulatory Visit (INDEPENDENT_AMBULATORY_CARE_PROVIDER_SITE_OTHER): Payer: Medicare HMO

## 2017-04-15 DIAGNOSIS — D510 Vitamin B12 deficiency anemia due to intrinsic factor deficiency: Secondary | ICD-10-CM | POA: Diagnosis not present

## 2017-05-13 ENCOUNTER — Ambulatory Visit (INDEPENDENT_AMBULATORY_CARE_PROVIDER_SITE_OTHER): Payer: Medicare HMO

## 2017-05-13 DIAGNOSIS — D510 Vitamin B12 deficiency anemia due to intrinsic factor deficiency: Secondary | ICD-10-CM | POA: Diagnosis not present

## 2017-06-17 ENCOUNTER — Ambulatory Visit (INDEPENDENT_AMBULATORY_CARE_PROVIDER_SITE_OTHER): Payer: Medicare HMO

## 2017-06-17 DIAGNOSIS — D510 Vitamin B12 deficiency anemia due to intrinsic factor deficiency: Secondary | ICD-10-CM | POA: Diagnosis not present

## 2017-07-15 ENCOUNTER — Ambulatory Visit: Payer: Medicare HMO

## 2017-07-16 ENCOUNTER — Ambulatory Visit (INDEPENDENT_AMBULATORY_CARE_PROVIDER_SITE_OTHER): Payer: Medicare HMO

## 2017-07-16 DIAGNOSIS — D510 Vitamin B12 deficiency anemia due to intrinsic factor deficiency: Secondary | ICD-10-CM

## 2017-08-19 ENCOUNTER — Ambulatory Visit (INDEPENDENT_AMBULATORY_CARE_PROVIDER_SITE_OTHER): Payer: Medicare HMO

## 2017-08-19 DIAGNOSIS — D510 Vitamin B12 deficiency anemia due to intrinsic factor deficiency: Secondary | ICD-10-CM | POA: Diagnosis not present

## 2017-09-16 ENCOUNTER — Ambulatory Visit (INDEPENDENT_AMBULATORY_CARE_PROVIDER_SITE_OTHER): Payer: Medicare HMO

## 2017-09-16 DIAGNOSIS — D510 Vitamin B12 deficiency anemia due to intrinsic factor deficiency: Secondary | ICD-10-CM

## 2017-10-21 ENCOUNTER — Ambulatory Visit (INDEPENDENT_AMBULATORY_CARE_PROVIDER_SITE_OTHER): Payer: Medicare HMO

## 2017-10-21 DIAGNOSIS — D510 Vitamin B12 deficiency anemia due to intrinsic factor deficiency: Secondary | ICD-10-CM | POA: Diagnosis not present

## 2017-11-18 ENCOUNTER — Ambulatory Visit (INDEPENDENT_AMBULATORY_CARE_PROVIDER_SITE_OTHER): Payer: Medicare HMO

## 2017-11-18 DIAGNOSIS — D510 Vitamin B12 deficiency anemia due to intrinsic factor deficiency: Secondary | ICD-10-CM

## 2017-11-27 ENCOUNTER — Ambulatory Visit: Payer: Self-pay | Admitting: *Deleted

## 2017-11-27 DIAGNOSIS — J019 Acute sinusitis, unspecified: Secondary | ICD-10-CM | POA: Diagnosis not present

## 2017-11-27 NOTE — Telephone Encounter (Signed)
Pt reports sneezing, "stuffy nose" pressure behind eyes and nose x3 days.  Had "scratchy" throat yesterday, none today. Denies any cough or SOB. Has been taking OTC Nyquil and Advil Cold with minimal relief. Advised per home care; nasal washes, Tylenol, OTC medications as currently taking, monitor temperature and to call back if symptoms worsen.  Reason for Disposition . Cold with no complications  Answer Assessment - Initial Assessment Questions 1. ONSET: "When did the nasal discharge start?"      3 days ago 2. AMOUNT: "How much discharge is there?"      Scant to none 3. COUGH: "Do you have a cough?" If yes, ask: "Describe the color of your sputum" (clear, white, yellow, green)    no 4. RESPIRATORY DISTRESS: "Describe your breathing."      Normal 5. FEVER: "Do you have a fever?" If so, ask: "What is your temperature, how was it measured, and when did it start?"     Unsure "I don't think so." 6. SEVERITY: "Overall, how bad are you feeling right now?" (e.g., doesn't interfere with normal activities, staying home from school/work, staying in bed)      Doing less than normal "Feel lousy" 7. OTHER SYMPTOMS: "Do you have any other symptoms?" (e.g., sore throat, earache, wheezing, vomiting)     "Scratchy throat yesterday, not today. Tired, pressure behind nose, eyes."  Protocols used: COMMON COLD-A-AH

## 2018-01-13 ENCOUNTER — Encounter: Payer: Medicare HMO | Admitting: Unknown Physician Specialty

## 2018-01-28 ENCOUNTER — Telehealth: Payer: Self-pay

## 2018-01-28 NOTE — Telephone Encounter (Signed)
Copied from [place]. Topic: Medicare AWV >> Jan 28, 2018  1:19 PM Hill, Tiffany A, LPN wrote: Reason for CRM: Called to schedule medicare annual wellness visit with NHA- Tiffany Hill,LPN at [facility]. Please schedule anytime. Can have on same day as CPE if preferred.  Any questions please contact Joanette Gula on skype or by phone- [phone]

## 2018-02-23 DIAGNOSIS — R69 Illness, unspecified: Secondary | ICD-10-CM | POA: Diagnosis not present

## 2018-02-24 DIAGNOSIS — H5213 Myopia, bilateral: Secondary | ICD-10-CM | POA: Diagnosis not present

## 2018-02-24 DIAGNOSIS — H1851 Endothelial corneal dystrophy: Secondary | ICD-10-CM | POA: Diagnosis not present

## 2018-02-24 DIAGNOSIS — H524 Presbyopia: Secondary | ICD-10-CM | POA: Diagnosis not present

## 2018-02-24 DIAGNOSIS — H2513 Age-related nuclear cataract, bilateral: Secondary | ICD-10-CM | POA: Diagnosis not present

## 2018-03-03 ENCOUNTER — Ambulatory Visit (INDEPENDENT_AMBULATORY_CARE_PROVIDER_SITE_OTHER): Payer: Medicare HMO | Admitting: Unknown Physician Specialty

## 2018-03-03 ENCOUNTER — Encounter: Payer: Self-pay | Admitting: Unknown Physician Specialty

## 2018-03-03 DIAGNOSIS — M7541 Impingement syndrome of right shoulder: Secondary | ICD-10-CM | POA: Diagnosis not present

## 2018-03-03 DIAGNOSIS — L509 Urticaria, unspecified: Secondary | ICD-10-CM | POA: Diagnosis not present

## 2018-03-03 DIAGNOSIS — L989 Disorder of the skin and subcutaneous tissue, unspecified: Secondary | ICD-10-CM

## 2018-03-03 MED ORDER — TRIAMCINOLONE ACETONIDE 0.1 % EX CREA: 1.0000 "application " | TOPICAL_CREAM | Freq: Two times a day (BID) | CUTANEOUS | 0 refills | Status: DC

## 2018-03-03 NOTE — Patient Instructions (Signed)
Shoulder Impingement Syndrome Rehab Ask your health care provider which exercises are safe for you. Do exercises exactly as told by your health care provider and adjust them as directed. It is normal to feel mild stretching, pulling, tightness, or discomfort as you do these exercises, but you should stop right away if you feel sudden pain or your pain gets worse.Do not begin these exercises until told by your health care provider. Stretching and range of motion exercise This exercise warms up your muscles and joints and improves the movement and flexibility of your shoulder. This exercise also helps to relieve pain and stiffness. Exercise A: Passive horizontal adduction  1. Sit or stand and pull your left / right elbow across your chest, toward your other shoulder. Stop when you feel a gentle stretch in the back of your shoulder and upper arm. ? Keep your arm at shoulder height. ? Keep your arm as close to your body as you comfortably can. 2. Hold for __________ seconds. 3. Slowly return to the starting position. Repeat __________ times. Complete this exercise __________ times a day. Strengthening exercises These exercises build strength and endurance in your shoulder. Endurance is the ability to use your muscles for a long time, even after they get tired. Exercise B: External rotation, isometric 1. Stand or sit in a doorway, facing the door frame. 2. Bend your left / right elbow and place the back of your wrist against the door frame. Only your wrist should be touching the frame. Keep your upper arm at your side. 3. Gently press your wrist against the door frame, as if you are trying to push your arm away from your abdomen. ? Avoid shrugging your shoulder while you press your hand against the door frame. Keep your shoulder blade tucked down toward the middle of your back. 4. Hold for __________ seconds. 5. Slowly release the tension, and relax your muscles completely before you do the exercise  again. Repeat __________ times. Complete this exercise __________ times a day. Exercise C: Internal rotation, isometric  1. Stand or sit in a doorway, facing the door frame. 2. Bend your left / right elbow and place the inside of your wrist against the door frame. Only your wrist should be touching the frame. Keep your upper arm at your side. 3. Gently press your wrist against the door frame, as if you are trying to push your arm toward your abdomen. ? Avoid shrugging your shoulder while you press your hand against the door frame. Keep your shoulder blade tucked down toward the middle of your back. 4. Hold for __________ seconds. 5. Slowly release the tension, and relax your muscles completely before you do the exercise again. Repeat __________ times. Complete this exercise __________ times a day. Exercise D: Scapular protraction, supine  1. Lie on your back on a firm surface. Hold a __________ weight in your left / right hand. 2. Raise your left / right arm straight into the air so your hand is directly above your shoulder joint. 3. Push the weight into the air so your shoulder lifts off of the surface that you are lying on. Do not move your head, neck, or back. 4. Hold for __________ seconds. 5. Slowly return to the starting position. Let your muscles relax completely before you repeat this exercise. Repeat __________ times. Complete this exercise __________ times a day. Exercise E: Scapular retraction  1. Sit in a stable chair without armrests, or stand. 2. Secure an exercise band to a stable  object in front of you so the band is at shoulder height. 3. Hold one end of the exercise band in each hand. Your palms should face down. 4. Squeeze your shoulder blades together and move your elbows slightly behind you. Do not shrug your shoulders while you do this. 5. Hold for __________ seconds. 6. Slowly return to the starting position. Repeat __________ times. Complete this exercise __________  times a day. Exercise F: Shoulder extension  1. Sit in a stable chair without armrests, or stand. 2. Secure an exercise band to a stable object in front of you where the band is above shoulder height. 3. Hold one end of the exercise band in each hand. 4. Straighten your elbows and lift your hands up to shoulder height. 5. Squeeze your shoulder blades together and pull your hands down to the sides of your thighs. Stop when your hands are straight down by your sides. Do not let your hands go behind your body. 6. Hold for __________ seconds. 7. Slowly return to the starting position. Repeat __________ times. Complete this exercise __________ times a day. This information is not intended to replace advice given to you by your health care provider. Make sure you discuss any questions you have with your health care provider. Document Released: 11/17/2005 Document Revised: 07/24/2016 Document Reviewed: 10/20/2015 Elsevier Interactive Patient Education  2018 [employer].  Shoulder Impingement Syndrome Shoulder impingement syndrome is a condition that causes pain when connective tissues (tendons) surrounding the shoulder joint become pinched. These tendons are part of the group of muscles and tissues that help to stabilize the shoulder (rotator cuff). Beneath the rotator cuff is a fluid-filled sac (bursa) that allows the muscles and tendons to glide smoothly. The bursa may become swollen or irritated (bursitis). Bursitis, swelling in the rotator cuff tendons, or both conditions can decrease how much space is under a bone in the shoulder joint (acromion), resulting in impingement. What are the causes? Shoulder impingement syndrome can be caused by bursitis or swelling of the rotator cuff tendons, which may result from:  Repetitive overhead arm movements.  Falling onto the shoulder.  Weakness in the shoulder muscles.  What increases the risk? You may be more likely to develop this condition if you  are an athlete who participates in:  Sports that involve throwing, such as baseball.  Tennis.  Swimming.  Volleyball.  Some people are also more likely to develop impingement syndrome because of the shape of their acromion bone. What are the signs or symptoms? The main symptom of this condition is pain on the front or side of the shoulder. Pain may:  Get worse when lifting or raising the arm.  Get worse at night.  Wake you up from sleeping.  Feel sharp when the shoulder is moved, and then fade to an ache.  Other signs and symptoms may include:  Tenderness.  Stiffness.  Inability to raise the arm above shoulder level or behind the body.  Weakness.  How is this diagnosed? This condition may be diagnosed based on:  Your symptoms.  Your medical history.  A physical exam.  Imaging tests, such as: ? X-rays. ? MRI. ? Ultrasound.  How is this treated? Treatment for this condition may include:  Resting your shoulder and avoiding all activities that cause pain or put stress on the shoulder.  Icing your shoulder.  NSAIDs to help reduce pain and swelling.  One or more injections of medicines to numb the area and reduce inflammation.  Physical therapy.  Surgery. This may be needed if nonsurgical treatments have not helped. Surgery may involve repairing the rotator cuff, reshaping the acromion, or removing the bursa.  Follow these instructions at home: Managing pain, stiffness, and swelling  If directed, apply ice to the injured area. ? Put ice in a plastic bag. ? Place a towel between your skin and the bag. ? Leave the ice on for 20 minutes, 2-3 times a day. Activity  Rest and return to your normal activities as told by your health care provider. Ask your health care provider what activities are safe for you.  Do exercises as told by your health care provider. General instructions  Do not use any tobacco products, including cigarettes, chewing tobacco, or  e-cigarettes. Tobacco can delay healing. If you need help quitting, ask your health care provider.  Ask your health care provider when it is safe for you to drive.  Take over-the-counter and prescription medicines only as told by your health care provider.  Keep all follow-up visits as told by your health care provider. This is important. How is this prevented?  Give your body time to rest between periods of activity.  Be safe and responsible while being active to avoid falls.  Maintain physical fitness, including strength and flexibility. Contact a health care provider if:  Your symptoms have not improved after 1-2 months of treatment and rest.  You cannot lift your arm away from your body. This information is not intended to replace advice given to you by your health care provider. Make sure you discuss any questions you have with your health care provider. Document Released: 11/17/2005 Document Revised: 07/24/2016 Document Reviewed: 10/20/2015 Elsevier Interactive Patient Education  [employer].

## 2018-03-03 NOTE — Progress Notes (Signed)
BP 116/75   Pulse 81   Temp 98.6 F (37 C) (Oral)   Ht 5' 1.3" (1.557 m)   Wt 109 lb 4.8 oz (49.6 kg)   SpO2 96%   BMI 20.45 kg/m    Subjective:    Patient ID: Kayla Johnson, female    DOB: 11-28-1946, 72 y.o.   MRN: [id number]  HPI: Kayla Johnson is a 72 y.o. female  Chief Complaint  Patient presents with  . legs itching    pt states her legs have been itching for months, states the right has been itching longer than the left   Legs itching Pt states lower legs, mostly lateral itching for several months.  States it doesn't completely go away but finds an OTC medication is helpful.  Tries not to scratch.    Right shoulder pain This is a chronic pain with radiation down arm.  States it comes and goes on it's own.  She is unable to lay on it.  Tries not to lift anything heavy.  Able to raise but weaker than her other arm.     Relevant past medical, surgical, family and social history reviewed and updated as indicated. Interim medical history since our last visit reviewed. Allergies and medications reviewed and updated.  Review of Systems  Per HPI unless specifically indicated above     Objective:    BP 116/75   Pulse 81   Temp 98.6 F (37 C) (Oral)   Ht 5' 1.3" (1.557 m)   Wt 109 lb 4.8 oz (49.6 kg)   SpO2 96%   BMI 20.45 kg/m   Wt Readings from Last 3 Encounters:  03/03/18 109 lb 4.8 oz (49.6 kg)  02/12/17 108 lb (49 kg)  12/31/16 108 lb 3.2 oz (49.1 kg)    Physical Exam  Constitutional: She is oriented to person, place, and time. She appears well-developed and well-nourished. No distress.  HENT:  Head: Normocephalic and atraumatic.  Eyes: Conjunctivae and lids are normal. Right eye exhibits no discharge. Left eye exhibits no discharge. No scleral icterus.  Neck: Normal range of motion. Neck supple. No JVD present. Carotid bruit is not present.  Cardiovascular: Normal rate, regular rhythm and normal heart sounds.  Pulmonary/Chest: Effort normal and breath  sounds normal.  Abdominal: Normal appearance. There is no splenomegaly or hepatomegaly.  Musculoskeletal:       Right shoulder: She exhibits decreased range of motion.  Positive impingement  Neurological: She is alert and oriented to person, place, and time.  Skin: Skin is warm, dry and intact. No rash noted. No pallor.  Drynes bilateral lower legs.  Right lateral leg lesion concerning for basal cell  Psychiatric: She has a normal mood and affect. Her behavior is normal. Judgment and thought content normal.    Results for orders placed or performed during the hospital encounter of 32/35/57  Basic metabolic panel  Result Value Ref Range   Sodium 138 135 - 145 mmol/L   Potassium 3.8 3.5 - 5.1 mmol/L   Chloride 103 101 - 111 mmol/L   CO2 29 22 - 32 mmol/L   Glucose, Bld 133 (H) 65 - 99 mg/dL   BUN 11 6 - 20 mg/dL   Creatinine, Ser 0.65 0.44 - 1.00 mg/dL   Calcium 8.7 (L) 8.9 - 10.3 mg/dL   GFR calc non Af Amer >60 >60 mL/min   GFR calc Af Amer >60 >60 mL/min   Anion gap 6 5 - 15  CBC  Result Value Ref Range   WBC 10.3 3.6 - 11.0 K/uL   RBC 4.67 3.80 - 5.20 MIL/uL   Hemoglobin 12.3 12.0 - 16.0 g/dL   HCT 36.9 35.0 - 47.0 %   MCV 79.0 (L) 80.0 - 100.0 fL   MCH 26.3 26.0 - 34.0 pg   MCHC 33.3 32.0 - 36.0 g/dL   RDW 18.2 (H) 11.5 - 14.5 %   Platelets 307 150 - 440 K/uL  Troponin I  Result Value Ref Range   Troponin I <0.03 <0.03 ng/mL  Hepatic function panel  Result Value Ref Range   Total Protein 6.9 6.5 - 8.1 g/dL   Albumin 4.0 3.5 - 5.0 g/dL   AST 23 15 - 41 U/L   ALT 14 14 - 54 U/L   Alkaline Phosphatase 75 38 - 126 U/L   Total Bilirubin 0.8 0.3 - 1.2 mg/dL   Bilirubin, Direct <0.1 (L) 0.1 - 0.5 mg/dL   Indirect Bilirubin NOT CALCULATED 0.3 - 0.9 mg/dL  Lipase, blood  Result Value Ref Range   Lipase 24 11 - 51 U/L      Assessment & Plan:   Problem List Items Addressed This Visit      Unprioritized   Shoulder impingement syndrome, right    Rotator cuff long  standing.  Refer to PT for further management.  Pt info given      Skin lesion of right leg    Rx for steroid cream.  Refer to dermatology for skin check      Relevant Orders   Ambulatory referral to Dermatology   Urticaria   Relevant Orders   Ambulatory referral to Dermatology       Follow up plan: Return for Needs PE.

## 2018-03-03 NOTE — Assessment & Plan Note (Signed)
Rx for steroid cream.  Refer to dermatology for skin check

## 2018-03-03 NOTE — Assessment & Plan Note (Addendum)
Rotator cuff long standing.  Refer to PT for further management.  Pt info given

## 2018-03-15 ENCOUNTER — Encounter: Payer: Self-pay | Admitting: Unknown Physician Specialty

## 2018-03-23 DIAGNOSIS — J40 Bronchitis, not specified as acute or chronic: Secondary | ICD-10-CM | POA: Diagnosis not present

## 2018-03-23 DIAGNOSIS — R509 Fever, unspecified: Secondary | ICD-10-CM | POA: Diagnosis not present

## 2018-03-23 DIAGNOSIS — J019 Acute sinusitis, unspecified: Secondary | ICD-10-CM | POA: Diagnosis not present

## 2018-03-24 ENCOUNTER — Encounter: Payer: Medicare HMO | Admitting: Unknown Physician Specialty

## 2018-04-14 ENCOUNTER — Encounter: Payer: Self-pay | Admitting: Unknown Physician Specialty

## 2018-04-14 ENCOUNTER — Ambulatory Visit (INDEPENDENT_AMBULATORY_CARE_PROVIDER_SITE_OTHER): Payer: Medicare HMO | Admitting: Unknown Physician Specialty

## 2018-04-14 ENCOUNTER — Telehealth: Payer: Self-pay | Admitting: Unknown Physician Specialty

## 2018-04-14 ENCOUNTER — Ambulatory Visit (INDEPENDENT_AMBULATORY_CARE_PROVIDER_SITE_OTHER): Payer: Medicare HMO

## 2018-04-14 VITALS — BP 118/62 | HR 99 | Temp 98.7°F | Ht 61.5 in | Wt 105.8 lb

## 2018-04-14 VITALS — BP 118/62 | HR 99 | Temp 98.7°F | Resp 16 | Ht 61.5 in | Wt 105.8 lb

## 2018-04-14 DIAGNOSIS — Z0000 Encounter for general adult medical examination without abnormal findings: Secondary | ICD-10-CM | POA: Diagnosis not present

## 2018-04-14 DIAGNOSIS — Z1231 Encounter for screening mammogram for malignant neoplasm of breast: Secondary | ICD-10-CM | POA: Diagnosis not present

## 2018-04-14 DIAGNOSIS — E7800 Pure hypercholesterolemia, unspecified: Secondary | ICD-10-CM | POA: Diagnosis not present

## 2018-04-14 DIAGNOSIS — D510 Vitamin B12 deficiency anemia due to intrinsic factor deficiency: Secondary | ICD-10-CM

## 2018-04-14 DIAGNOSIS — Z1239 Encounter for other screening for malignant neoplasm of breast: Secondary | ICD-10-CM

## 2018-04-14 DIAGNOSIS — Z7189 Other specified counseling: Secondary | ICD-10-CM

## 2018-04-14 NOTE — Progress Notes (Signed)
BP 118/62   Pulse 99   Temp 98.7 F (37.1 C) (Temporal)   Ht 5' 1.5" (1.562 m)   Wt 105 lb 12.8 oz (48 kg)   SpO2 97%   BMI 19.67 kg/m    Subjective:    Patient ID: Kayla Johnson, female    DOB: 23-Sep-1946, 72 y.o.   MRN: [id number]  HPI: Kayla Johnson is a 72 y.o. female  Chief Complaint  Patient presents with  . Annual Exam    had wellness exam before this visit   History of B12 deficiency.  Was getting monthly injections.  No supplements since December.    Depression screen [facility] 2/9 04/14/2018 04/14/2018 12/31/2016 12/12/2015 10/22/2015  Decreased Interest 0 0 0 0 0  Down, Depressed, Hopeless 0 0 0 0 0  PHQ - 2 Score 0 0 0 0 0  Altered sleeping 0 - 0 - -  Tired, decreased energy 0 - 0 - -  Change in appetite 0 - 0 - -  Feeling bad or failure about yourself  0 - 0 - -  Trouble concentrating 0 - 0 - -  Moving slowly or fidgety/restless 0 - 0 - -  Suicidal thoughts 0 - 0 - -  PHQ-9 Score 0 - 0 - -     Social History   Socioeconomic History  . Marital status: Widowed    Spouse name: Not on file  . Number of children: Not on file  . Years of education: Not on file  . Highest education level: Not on file  Occupational History  . Not on file  Social Needs  . Financial resource strain: Not hard at all  . Food insecurity:    Worry: Never true    Inability: Never true  . Transportation needs:    Medical: No    Non-medical: No  Tobacco Use  . Smoking status: Never Smoker  . Smokeless tobacco: Never Used  Substance and Sexual Activity  . Alcohol use: No    Alcohol/week: 0.0 oz  . Drug use: No  . Sexual activity: Not on file  Lifestyle  . Physical activity:    Days per week: 0 days    Minutes per session: 0 min  . Stress: Not at all  Relationships  . Social connections:    Talks on phone: More than three times a week    Gets together: More than three times a week    Attends religious service: More than 4 times per year    Active member of club or  organization: No    Attends meetings of clubs or organizations: Never    Relationship status: Widowed  . Intimate partner violence:    Fear of current or ex partner: No    Emotionally abused: No    Physically abused: No    Forced sexual activity: No  Other Topics Concern  . Not on file  Social History Narrative   Works 32 hours a week at [employer] History  Problem Relation Age of Onset  . Arthritis Mother   . Diabetes Mother   . Arthritis Father   . Diabetes Sister   . Stroke Sister   . Cancer Sister        ovarian  . Diabetes Sister    History reviewed. No pertinent past medical history.  Past Surgical History:  Procedure Laterality Date  . Molar Pregnancy     S/P D & C  . TONSILLECTOMY    .  Tubes & Ovaries removed  2015    Relevant past medical, surgical, family and social history reviewed and updated as indicated. Interim medical history since our last visit reviewed. Allergies and medications reviewed and updated.  Review of Systems  Constitutional: Negative.   HENT: Negative.   Eyes: Negative.   Respiratory: Negative.   Cardiovascular: Negative.   Gastrointestinal: Negative.   Endocrine: Negative.   Genitourinary:       History of lichen sclerosis.  Managed with occasional Clobetasol  Musculoskeletal: Negative.   Skin: Negative.   Psychiatric/Behavioral: Negative.     Per HPI unless specifically indicated above     Objective:    BP 118/62   Pulse 99   Temp 98.7 F (37.1 C) (Temporal)   Ht 5' 1.5" (1.562 m)   Wt 105 lb 12.8 oz (48 kg)   SpO2 97%   BMI 19.67 kg/m   Wt Readings from Last 3 Encounters:  04/14/18 105 lb 12.8 oz (48 kg)  04/14/18 105 lb 12.8 oz (48 kg)  03/03/18 109 lb 4.8 oz (49.6 kg)    Physical Exam  Constitutional: She is oriented to person, place, and time. She appears well-developed and well-nourished.  HENT:  Head: Normocephalic and atraumatic.  Eyes: Pupils are equal, round, and reactive to light. Right eye  exhibits no discharge. Left eye exhibits no discharge. No scleral icterus.  Neck: Normal range of motion. Neck supple. Carotid bruit is not present. No thyromegaly present.  Cardiovascular: Normal rate, regular rhythm and normal heart sounds. Exam reveals no gallop and no friction rub.  No murmur heard. Pulmonary/Chest: Effort normal and breath sounds normal. No respiratory distress. She has no wheezes. She has no rales. No breast tenderness or discharge.  Abdominal: Soft. Bowel sounds are normal. There is no tenderness. There is no rebound.  Genitourinary: No breast tenderness or discharge.  Musculoskeletal: Normal range of motion.  Lymphadenopathy:    She has no cervical adenopathy.  Neurological: She is alert and oriented to person, place, and time.  Skin: Skin is warm, dry and intact. No rash noted.  Psychiatric: She has a normal mood and affect. Her speech is normal and behavior is normal. Judgment and thought content normal. Cognition and memory are normal.    Results for orders placed or performed during the hospital encounter of 51/76/16  Basic metabolic panel  Result Value Ref Range   Sodium 138 135 - 145 mmol/L   Potassium 3.8 3.5 - 5.1 mmol/L   Chloride 103 101 - 111 mmol/L   CO2 29 22 - 32 mmol/L   Glucose, Bld 133 (H) 65 - 99 mg/dL   BUN 11 6 - 20 mg/dL   Creatinine, Ser 0.65 0.44 - 1.00 mg/dL   Calcium 8.7 (L) 8.9 - 10.3 mg/dL   GFR calc non Af Amer >60 >60 mL/min   GFR calc Af Amer >60 >60 mL/min   Anion gap 6 5 - 15  CBC  Result Value Ref Range   WBC 10.3 3.6 - 11.0 K/uL   RBC 4.67 3.80 - 5.20 MIL/uL   Hemoglobin 12.3 12.0 - 16.0 g/dL   HCT 36.9 35.0 - 47.0 %   MCV 79.0 (L) 80.0 - 100.0 fL   MCH 26.3 26.0 - 34.0 pg   MCHC 33.3 32.0 - 36.0 g/dL   RDW 18.2 (H) 11.5 - 14.5 %   Platelets 307 150 - 440 K/uL  Troponin I  Result Value Ref Range   Troponin I <0.03 <0.03 ng/mL  Hepatic function panel  Result Value Ref Range   Total Protein 6.9 6.5 - 8.1 g/dL    Albumin 4.0 3.5 - 5.0 g/dL   AST 23 15 - 41 U/L   ALT 14 14 - 54 U/L   Alkaline Phosphatase 75 38 - 126 U/L   Total Bilirubin 0.8 0.3 - 1.2 mg/dL   Bilirubin, Direct <0.1 (L) 0.1 - 0.5 mg/dL   Indirect Bilirubin NOT CALCULATED 0.3 - 0.9 mg/dL  Lipase, blood  Result Value Ref Range   Lipase 24 11 - 51 U/L      Assessment & Plan:   Problem List Items Addressed This Visit      Unprioritized   Advanced care planning/counseling discussion    A voluntary discussion about advance care planning including the explanation and discussion of advance directives was extensively discussed  with the patient.  Explanation about the health care proxy and Living will was reviewed and packet with forms with explanation of how to fill them out was given.  During this discussion, the patient was able to identify a health care proxy as her daughter and plans to update paperwork required.  Patient was offered a separate [place] visit for further assistance with forms.  Time spent: 15 min       Individuals present: patient       Pernicious anemia   Relevant Orders   Vitamin B12   CBC with Differential/Platelet    Other Visit Diagnoses    Annual physical exam    -  Primary   Hypercholesteremia       Relevant Orders   Lipid Panel w/o Chol/HDL Ratio       Follow up plan: Return in about 1 year (around 04/15/2019).

## 2018-04-14 NOTE — Progress Notes (Signed)
Subjective:   Kayla Johnson is a 72 y.o. female who presents for Medicare Annual (Subsequent) preventive examination.  Review of Systems:   Cardiac Risk Factors include: advanced age (>17men, >77 women)     Objective:     Vitals: BP 118/62 (BP Location: Left Arm, Patient Position: Sitting)   Pulse 99   Temp 98.7 F (37.1 C) (Temporal)   Resp 16   Ht 5' 1.5" (1.562 m)   Wt 105 lb 12.8 oz (48 kg)   SpO2 97%   BMI 19.67 kg/m   Body mass index is 19.67 kg/m.  Advanced Directives 04/14/2018 02/12/2017 12/31/2016 12/12/2015 12/12/2015  Does Patient Have a Medical Advance Directive? No No Yes No Yes  Type of Advance Directive - - - - Living will  Does patient want to make changes to medical advance directive? Yes (MAU/Ambulatory/Procedural Areas - Information given) - - - -  Would patient like information on creating a medical advance directive? - - - Yes - Scientist, clinical (histocompatibility and immunogenetics) given -    Tobacco Social History   Tobacco Use  Smoking Status Never Smoker  Smokeless Tobacco Never Used     Counseling given: Not Answered   Clinical Intake:  Pre-visit preparation completed: Yes  Pain : No/denies pain     Nutritional Status: BMI of 19-24  Normal Nutritional Risks: None Diabetes: No  How often do you need to have someone help you when you read instructions, pamphlets, or other written materials from your doctor or pharmacy?: 1 - Never What is the last grade level you completed in school?: GED   Interpreter Needed?: No  Information entered by :: Arren Laminack,LPN   History reviewed. No pertinent past medical history. Past Surgical History:  Procedure Laterality Date  . [place] Pregnancy     S/P D & C  . TONSILLECTOMY    . Tubes & Ovaries removed  2015   Family History  Problem Relation Age of Onset  . Arthritis Mother   . Diabetes Mother   . Arthritis Father   . Diabetes Sister   . Stroke Sister   . Cancer Sister        ovarian  . Diabetes Sister    Social  History   Socioeconomic History  . Marital status: Widowed    Spouse name: Not on file  . Number of children: Not on file  . Years of education: Not on file  . Highest education level: Not on file  Occupational History  . Not on file  Social Needs  . Financial resource strain: Not hard at all  . Food insecurity:    Worry: Never true    Inability: Never true  . Transportation needs:    Medical: No    Non-medical: No  Tobacco Use  . Smoking status: Never Smoker  . Smokeless tobacco: Never Used  Substance and Sexual Activity  . Alcohol use: No    Alcohol/week: 0.0 oz  . Drug use: No  . Sexual activity: Not on file  Lifestyle  . Physical activity:    Days per week: 0 days    Minutes per session: 0 min  . Stress: Not at all  Relationships  . Social connections:    Talks on phone: More than three times a week    Gets together: More than three times a week    Attends religious service: More than 4 times per year    Active member of club or organization: No    Attends  meetings of clubs or organizations: Never    Relationship status: Widowed  Other Topics Concern  . Not on file  Social History Narrative   Works 32 hours a week at [employer] Medications as of 04/14/2018  Medication Sig  . clobetasol ointment (TEMOVATE) 2.11 % Apply 1 application topically 2 (two) times daily.  Marland Kitchen triamcinolone cream (KENALOG) 0.1 % Apply 1 application topically 2 (two) times daily.   Facility-Administered Encounter Medications as of 04/14/2018  Medication  . cyanocobalamin ((VITAMIN B-12)) injection 1,000 mcg  . cyanocobalamin ((VITAMIN B-12)) injection 1,000 mcg    Activities of Daily Living In your present state of health, do you have any difficulty performing the following activities: 04/14/2018  Hearing? N  Vision? N  Difficulty concentrating or making decisions? N  Walking or climbing stairs? N  Dressing or bathing? N  Doing errands, shopping? N  Preparing  Food and eating ? N  Using the Toilet? N  In the past six months, have you accidently leaked urine? N  Do you have problems with loss of bowel control? N  Managing your Medications? N  Managing your Finances? N  Housekeeping or managing your Housekeeping? N  Some recent data might be hidden    Patient Care Team: Kathrine Haddock, NP as PCP - General (Nurse Practitioner) Thelma Comp, OD (Optometry)    Assessment:   This is a routine wellness examination for Kayla Johnson.  Exercise Activities and Dietary recommendations Current Exercise Habits: The patient has a physically strenous job, but has no regular exercise apart from work., Exercise limited by: None identified  Goals    . DIET - INCREASE WATER INTAKE     Recommend drinking at least 6-8 glasses of water a day        Fall Risk Fall Risk  04/14/2018 04/14/2018 12/31/2016 12/12/2015 10/22/2015  Falls in the past year? No No No No No   Is the patient's home free of loose throw rugs in walkways, pet beds, electrical cords, etc?   yes      Grab bars in the bathroom? no      Handrails on the stairs?   no stairs       Adequate lighting?   yes  Timed Get Up and Go performed: Completed in 8 seconds with no use of assistive devices, steady gait. No intervention needed at this time.   Depression Screen PHQ 2/9 Scores 04/14/2018 04/14/2018 12/31/2016 12/12/2015  PHQ - 2 Score 0 0 0 0  PHQ- 9 Score 0 - 0 -     Cognitive Function     6CIT Screen 04/14/2018  What Year? 0 points  What month? 0 points  What time? 0 points  Count back from 20 0 points  Months in reverse 0 points  Repeat phrase 0 points  Total Score 0    Immunization History  Administered Date(s) Administered  . Pneumococcal Conjugate-13 12/12/2015    Qualifies for Shingles Vaccine? Yes, discussed shingrix vaccine   Screening Tests Health Maintenance  Topic Date Due  . MAMMOGRAM  01/17/2017  . TETANUS/TDAP  03/04/2019 (Originally 05/25/1965)  . Hepatitis C  Screening  04/15/2019 (Originally 05-30-1946)  . PNA vac Low Risk Adult (2 of 2 - PPSV23) 04/15/2019 (Originally 12/11/2016)  . INFLUENZA VACCINE  07/01/2018  . Fecal DNA (Cologuard)  01/01/2019  . DEXA SCAN  Completed    Cancer Screenings: Lung: Low Dose CT Chest recommended if Age 21-80 years, 30 pack-year currently smoking OR  have quit w/in 15years. Patient does not qualify. Breast:  Up to date on Mammogram? No   Up to date of Bone Density/Dexa? Yes 01/17/2015 Colorectal: cologuard completed 01/02/2016, due 01/01/2019  Additional Screenings:  Hepatitis C Screening:  Declined     Plan:    I have personally reviewed and addressed the Medicare Annual Wellness questionnaire and have noted the following in the patient's chart:  A. Medical and social history B. Use of alcohol, tobacco or illicit drugs  C. Current medications and supplements D. Functional ability and status E.  Nutritional status F.  Physical activity G. Advance directives H. List of other physicians I.  Hospitalizations, surgeries, and ER visits in previous 12 months J.  [place] such as hearing and vision if needed, cognitive and depression L. Referrals and appointments   In addition, I have reviewed and discussed with patient certain preventive protocols, quality metrics, and best practice recommendations. A written personalized care plan for preventive services as well as general preventive health recommendations were provided to patient.   Signed,  Tyler Aas, LPN Nurse Health Advisor   Nurse Notes:none

## 2018-04-14 NOTE — Patient Instructions (Signed)
[name] , Thank you for taking time to come for your Medicare Wellness Visit. I appreciate your ongoing commitment to your health goals. Please review the following plan we discussed and let me know if I can assist you in the future.   Screening recommendations/referrals: Colonoscopy: cologuard completed 01/02/2016, due 01/01/2019 Mammogram: due now Please call [phone] to schedule your mammogram.  Bone Density: completed 01/17/2015 Recommended yearly ophthalmology/optometry visit for glaucoma screening and checkup Recommended yearly dental visit for hygiene and checkup  Vaccinations: Influenza vaccine: due 08/2018 Pneumococcal vaccine: declined pneumovax 23  Tdap vaccine: due, check with your insurance company for coverage  Shingles vaccine: shingrix eligible, check with your insurance company for coverage     Advanced directives: Advance directive discussed with you today. I have provided a copy for you to complete at home and have notarized. Once this is complete please bring a copy in to our office so we can scan it into your chart.  Conditions/risks identified: Recommend drinking at least 6-8 glasses of water a day   Next appointment: Follow up in one year for your annual wellness exam.    Preventive Care 65 Years and Older, Female Preventive care refers to lifestyle choices and visits with your health care provider that can promote health and wellness. What does preventive care include?  A yearly physical exam. This is also called an annual well check.  Dental exams once or twice a year.  Routine eye exams. Ask your health care provider how often you should have your eyes checked.  Personal lifestyle choices, including:  Daily care of your teeth and gums.  Regular physical activity.  Eating a healthy diet.  Avoiding tobacco and drug use.  Limiting alcohol use.  Practicing safe sex.  Taking low-dose aspirin every day.  Taking vitamin and mineral supplements as  recommended by your health care provider. What happens during an annual well check? The services and screenings done by your health care provider during your annual well check will depend on your age, overall health, lifestyle risk factors, and family history of disease. Counseling  Your health care provider may ask you questions about your:  Alcohol use.  Tobacco use.  Drug use.  Emotional well-being.  Home and relationship well-being.  Sexual activity.  Eating habits.  History of falls.  Memory and ability to understand (cognition).  Work and work Statistician.  Reproductive health. Screening  You may have the following tests or measurements:  Height, weight, and BMI.  Blood pressure.  Lipid and cholesterol levels. These may be checked every 5 years, or more frequently if you are over 71 years old.  Skin check.  Lung cancer screening. You may have this screening every year starting at age 83 if you have a 30-pack-year history of smoking and currently smoke or have quit within the past 15 years.  Fecal occult blood test (FOBT) of the stool. You may have this test every year starting at age 59.  Flexible sigmoidoscopy or colonoscopy. You may have a sigmoidoscopy every 5 years or a colonoscopy every 10 years starting at age 88.  Hepatitis C blood test.  Hepatitis B blood test.  Sexually transmitted disease (STD) testing.  Diabetes screening. This is done by checking your blood sugar (glucose) after you have not eaten for a while (fasting). You may have this done every 1-3 years.  Bone density scan. This is done to screen for osteoporosis. You may have this done starting at age 66.  Mammogram. This may  be done every 1-2 years. Talk to your health care provider about how often you should have regular mammograms. Talk with your health care provider about your test results, treatment options, and if necessary, the need for more tests. Vaccines  Your health care  provider may recommend certain vaccines, such as:  Influenza vaccine. This is recommended every year.  Tetanus, diphtheria, and acellular pertussis (Tdap, Td) vaccine. You may need a Td booster every 10 years.  Zoster vaccine. You may need this after age 62.  Pneumococcal 13-valent conjugate (PCV13) vaccine. One dose is recommended after age 100.  Pneumococcal polysaccharide (PPSV23) vaccine. One dose is recommended after age 20. Talk to your health care provider about which screenings and vaccines you need and how often you need them. This information is not intended to replace advice given to you by your health care provider. Make sure you discuss any questions you have with your health care provider. Document Released: 12/14/2015 Document Revised: 08/06/2016 Document Reviewed: 09/18/2015 Elsevier Interactive Patient Education  2017 [employer] Prevention in the Home Falls can cause injuries. They can happen to people of all ages. There are many things you can do to make your home safe and to help prevent falls. What can I do on the outside of my home?  Regularly fix the edges of walkways and driveways and fix any cracks.  Remove anything that might make you trip as you walk through a door, such as a raised step or threshold.  Trim any bushes or trees on the path to your home.  Use bright outdoor lighting.  Clear any walking paths of anything that might make someone trip, such as rocks or tools.  Regularly check to see if handrails are loose or broken. Make sure that both sides of any steps have handrails.  Any raised decks and porches should have guardrails on the edges.  Have any leaves, snow, or ice cleared regularly.  Use sand or salt on walking paths during winter.  Clean up any spills in your garage right away. This includes oil or grease spills. What can I do in the bathroom?  Use night lights.  Install grab bars by the toilet and in the tub and shower. Do  not use towel bars as grab bars.  Use non-skid mats or decals in the tub or shower.  If you need to sit down in the shower, use a plastic, non-slip stool.  Keep the floor dry. Clean up any water that spills on the floor as soon as it happens.  Remove soap buildup in the tub or shower regularly.  Attach bath mats securely with double-sided non-slip rug tape.  Do not have throw rugs and other things on the floor that can make you trip. What can I do in the bedroom?  Use night lights.  Make sure that you have a light by your bed that is easy to reach.  Do not use any sheets or blankets that are too big for your bed. They should not hang down onto the floor.  Have a firm chair that has side arms. You can use this for support while you get dressed.  Do not have throw rugs and other things on the floor that can make you trip. What can I do in the kitchen?  Clean up any spills right away.  Avoid walking on wet floors.  Keep items that you use a lot in easy-to-reach places.  If you need to reach something above you,  use a strong step stool that has a grab bar.  Keep electrical cords out of the way.  Do not use floor polish or wax that makes floors slippery. If you must use wax, use non-skid floor wax.  Do not have throw rugs and other things on the floor that can make you trip. What can I do with my stairs?  Do not leave any items on the stairs.  Make sure that there are handrails on both sides of the stairs and use them. Fix handrails that are broken or loose. Make sure that handrails are as long as the stairways.  Check any carpeting to make sure that it is firmly attached to the stairs. Fix any carpet that is loose or worn.  Avoid having throw rugs at the top or bottom of the stairs. If you do have throw rugs, attach them to the floor with carpet tape.  Make sure that you have a light switch at the top of the stairs and the bottom of the stairs. If you do not have them,  ask someone to add them for you. What else can I do to help prevent falls?  Wear shoes that:  Do not have high heels.  Have rubber bottoms.  Are comfortable and fit you well.  Are closed at the toe. Do not wear sandals.  If you use a stepladder:  Make sure that it is fully opened. Do not climb a closed stepladder.  Make sure that both sides of the stepladder are locked into place.  Ask someone to hold it for you, if possible.  Clearly mark and make sure that you can see:  Any grab bars or handrails.  First and last steps.  Where the edge of each step is.  Use tools that help you move around (mobility aids) if they are needed. These include:  Canes.  Walkers.  Scooters.  Crutches.  Turn on the lights when you go into a dark area. Replace any light bulbs as soon as they burn out.  Set up your furniture so you have a clear path. Avoid moving your furniture around.  If any of your floors are uneven, fix them.  If there are any pets around you, be aware of where they are.  Review your medicines with your doctor. Some medicines can make you feel dizzy. This can increase your chance of falling. Ask your doctor what other things that you can do to help prevent falls. This information is not intended to replace advice given to you by your health care provider. Make sure you discuss any questions you have with your health care provider. Document Released: 09/13/2009 Document Revised: 04/24/2016 Document Reviewed: 12/22/2014 Elsevier Interactive Patient Education  2017 [employer].

## 2018-04-14 NOTE — Telephone Encounter (Signed)
Copied from [place] [id number]. Topic: Medicare AWV >> Apr 14, 2018  9:17 AM Hill, Tiffany A, LPN wrote: Reason for CRM: called to see if patient could come in at 130pm today for medicare annual wellness visit prior to physical. Requested call back to confirm.  >> Apr 14, 2018  9:23 AM Cleaster Corin, NT wrote: Pt. Stated that she will be able to come in at 1:30 for awv

## 2018-04-14 NOTE — Patient Instructions (Signed)
Please do call to schedule your mammogram; the number to schedule one at either [facility] or [facility] is [phone]   

## 2018-04-14 NOTE — Assessment & Plan Note (Signed)
A voluntary discussion about advance care planning including the explanation and discussion of advance directives was extensively discussed  with the patient.  Explanation about the health care proxy and Living will was reviewed and packet with forms with explanation of how to fill them out was given.  During this discussion, the patient was able to identify a health care proxy as her daughter and plans to update paperwork required.  Patient was offered a separate [place] visit for further assistance with forms.  Time spent: 15 min       Individuals present: patient

## 2018-04-15 LAB — VITAMIN B12: Vitamin B-12: 391 pg/mL (ref 232–1245)

## 2018-04-15 LAB — LIPID PANEL W/O CHOL/HDL RATIO
Cholesterol, Total: 207 mg/dL — ABNORMAL HIGH (ref 100–199)
HDL: 64 mg/dL (ref >39)
LDL Calculated: 128 mg/dL — ABNORMAL HIGH (ref 0–99)
Triglycerides: 75 mg/dL (ref 0–149)
VLDL Cholesterol Cal: 15 mg/dL (ref 5–40)

## 2018-04-15 LAB — CBC WITH DIFFERENTIAL/PLATELET
BASOS: 1 %
Basophils Absolute: 0 10*3/uL (ref 0.0–0.2)
EOS (ABSOLUTE): 0.1 10*3/uL (ref 0.0–0.4)
EOS: 2 %
HEMATOCRIT: 36.4 % (ref 34.0–46.6)
Hemoglobin: 11.9 g/dL (ref 11.1–15.9)
Immature Grans (Abs): 0 10*3/uL (ref 0.0–0.1)
Immature Granulocytes: 0 %
LYMPHS ABS: 1.7 10*3/uL (ref 0.7–3.1)
Lymphs: 40 %
MCH: 26.8 pg (ref 26.6–33.0)
MCHC: 32.7 g/dL (ref 31.5–35.7)
MCV: 82 fL (ref 79–97)
MONOS ABS: 0.4 10*3/uL (ref 0.1–0.9)
Monocytes: 10 %
Neutrophils Absolute: 2 10*3/uL (ref 1.4–7.0)
Neutrophils: 47 %
Platelets: 369 10*3/uL (ref 150–379)
RBC: 4.44 x10E6/uL (ref 3.77–5.28)
RDW: 16.6 % — AB (ref 12.3–15.4)
WBC: 4.3 10*3/uL (ref 3.4–10.8)

## 2018-04-15 NOTE — Progress Notes (Signed)
Pt notified through mychart.

## 2018-06-09 ENCOUNTER — Telehealth: Payer: Self-pay | Admitting: Unknown Physician Specialty

## 2018-06-09 DIAGNOSIS — N904 Leukoplakia of vulva: Secondary | ICD-10-CM

## 2018-06-09 MED ORDER — TRIAMCINOLONE ACETONIDE 0.1 % EX CREA: 1.0000 "application " | TOPICAL_CREAM | Freq: Two times a day (BID) | CUTANEOUS | 0 refills | Status: DC

## 2018-06-09 NOTE — Telephone Encounter (Signed)
Copied from [place] [id number]. Topic: Quick Communication - Rx Refill/Question >> Jun 09, 2018 10:16 AM Yvette Rack wrote: Medication: clobetasol ointment (TEMOVATE) 0.05 %  Has the patient contacted their pharmacy? Yes.  Its an old RX but will call pharmacy for this request (Agent: If no, request that the patient contact the pharmacy for the refill.) (Agent: If yes, when and what did the pharmacy advise?) its an old RX but will send a request over to provider  Preferred Pharmacy (with phone number or street name): [facility] (N), [place] [phone] (Phone) [phone] (Fax)      Agent: Please be advised that RX refills may take up to 3 business days. We ask that you follow-up with your pharmacy.

## 2018-06-09 NOTE — Telephone Encounter (Signed)
See Rx refill request 06/09/18 for Temovate Last OV:04/14/18 Last refill:12/31/16 (expired) SJW:[id number] Pharmacy: [facility] [address] (N), [place] [phone] (Phone) [phone] (Fax)

## 2018-06-10 ENCOUNTER — Other Ambulatory Visit: Payer: Self-pay | Admitting: Unknown Physician Specialty

## 2018-06-10 DIAGNOSIS — N904 Leukoplakia of vulva: Secondary | ICD-10-CM

## 2018-06-10 NOTE — Telephone Encounter (Signed)
Patient called, left VM to return call to the office. Patient needs to be made aware that Kenolog cream was sent to the pharmacy 06/09/18 instead of her requested Temovate ointment. If she is still needing the Temovate ointment, another request will need to be submitted.

## 2018-06-10 NOTE — Telephone Encounter (Signed)
Patient calling and states that she would like to temovate ointment. Please advise.

## 2018-06-10 NOTE — Telephone Encounter (Addendum)
See 2nd TE from 06/09/18. Pt requesting refill of Clobetasol Ointment (Temovate). Pt was given refill of Triamcinolone cream (Kenalog) 0.1% instead of Clobetasol Ointment on 06/09/18

## 2018-07-28 ENCOUNTER — Ambulatory Visit (INDEPENDENT_AMBULATORY_CARE_PROVIDER_SITE_OTHER): Payer: Medicare HMO | Admitting: Obstetrics and Gynecology

## 2018-07-28 ENCOUNTER — Encounter: Payer: Self-pay | Admitting: Obstetrics and Gynecology

## 2018-07-28 DIAGNOSIS — N904 Leukoplakia of vulva: Secondary | ICD-10-CM

## 2018-07-28 MED ORDER — CLOBETASOL PROPIONATE 0.05 % EX CREA: TOPICAL_CREAM | CUTANEOUS | 1 refills | Status: DC

## 2018-07-28 NOTE — Patient Instructions (Signed)
I value your feedback and entrusting us with your care. If you get a [facility] patient survey, I would appreciate you taking the time to let us know about your experience today. Thank you! 

## 2018-07-28 NOTE — Progress Notes (Signed)
Kathrine Haddock, NP   Chief Complaint  Patient presents with  . Vaginal discomfort    itching, swelling at times, skin is whiteish at times per pt    HPI:      [name] is a 72 y.o. No obstetric history on file. who LMP was No LMP recorded. Patient is postmenopausal., presents today for NP eval of vaginal itching/irritation with white/pale skin. Noted fleeting sharp vag pains twice one day last wk. Pt with long hx of LS, treats with temovate once Q2 wks. Pt afraid to use too frequently. Has also noticed disfiguration of labia/clitoris. Has been wearing support hose recently due to being on her feet at work. No increased vag d/c, odor. She is not sex active.    Past Medical History:  Diagnosis Date  . Lichen sclerosus   . Pernicious anemia   . Shoulder bursitis     Past Surgical History:  Procedure Laterality Date  . Molar Pregnancy     S/P D & C  . TONSILLECTOMY    . Tubes & Ovaries removed  2015    Family History  Problem Relation Age of Onset  . Arthritis Mother   . Diabetes Mother   . Arthritis Father   . Diabetes Sister   . Stroke Sister   . Diabetes Sister   . Ovarian cancer Sister     Social History   Socioeconomic History  . Marital status: Widowed    Spouse name: Not on file  . Number of children: Not on file  . Years of education: Not on file  . Highest education level: Not on file  Occupational History  . Not on file  Social Needs  . Financial resource strain: Not hard at all  . Food insecurity:    Worry: Never true    Inability: Never true  . Transportation needs:    Medical: No    Non-medical: No  Tobacco Use  . Smoking status: Never Smoker  . Smokeless tobacco: Never Used  Substance and Sexual Activity  . Alcohol use: No    Alcohol/week: 0.0 standard drinks  . Drug use: No  . Sexual activity: Not Currently    Birth control/protection: Post-menopausal  Lifestyle  . Physical activity:    Days per week: 0 days    Minutes per  session: 0 min  . Stress: Not at all  Relationships  . Social connections:    Talks on phone: More than three times a week    Gets together: More than three times a week    Attends religious service: More than 4 times per year    Active member of club or organization: No    Attends meetings of clubs or organizations: Never    Relationship status: Widowed  . Intimate partner violence:    Fear of current or ex partner: No    Emotionally abused: No    Physically abused: No    Forced sexual activity: No  Other Topics Concern  . Not on file  Social History Narrative   Works 32 hours a week at [employer] Medications Prior to Visit  Medication Sig Dispense Refill  . clobetasol cream (TEMOVATE) 0.05 % APPLY TOPICALLY TWICE DAILY 30 g 0  . triamcinolone cream (KENALOG) 0.1 % Apply 1 application topically 2 (two) times daily. (Patient not taking: Reported on 07/28/2018) 30 g 0   Facility-Administered Medications Prior to Visit  Medication Dose Route Frequency Provider Last  Rate Last Dose  . cyanocobalamin ((VITAMIN B-12)) injection 1,000 mcg  1,000 mcg Intramuscular Q30 days Kathrine Haddock, NP   1,000 mcg at 07/16/17 1135  . cyanocobalamin ((VITAMIN B-12)) injection 1,000 mcg  1,000 mcg Intramuscular Q30 days Kathrine Haddock, NP   1,000 mcg at 11/18/17 1345    ROS:  Review of Systems  Constitutional: Negative for fatigue, fever and unexpected weight change.  Respiratory: Negative for cough, shortness of breath and wheezing.   Cardiovascular: Negative for chest pain, palpitations and leg swelling.  Gastrointestinal: Negative for blood in stool, constipation, diarrhea, nausea and vomiting.  Endocrine: Negative for cold intolerance, heat intolerance and polyuria.  Genitourinary: Positive for vaginal pain. Negative for dyspareunia, dysuria, flank pain, frequency, genital sores, hematuria, menstrual problem, pelvic pain, urgency, vaginal bleeding and vaginal discharge.    Musculoskeletal: Negative for back pain, joint swelling and myalgias.  Skin: Negative for rash.  Neurological: Negative for dizziness, syncope, light-headedness, numbness and headaches.  Hematological: Negative for adenopathy.  Psychiatric/Behavioral: Negative for agitation, confusion, sleep disturbance and suicidal ideas. The patient is not nervous/anxious.     OBJECTIVE:   Vitals:  BP 102/64   Pulse 82   Ht 5\' 2"  (1.575 m)   Wt 106 lb (48.1 kg)   BMI 19.39 kg/m   Physical Exam  Constitutional: She is oriented to person, place, and time. She appears well-developed.  Neck: Normal range of motion.  Pulmonary/Chest: Effort normal.  Genitourinary: There is rash on the right labia. There is rash on the left labia.  Genitourinary Comments: PALE THIN SKIN BILAT LABIA MAJORA/MINORA/CLITORIS WITH DISFIGUREMENT OF LT LABIA MAJORA; FEW FISSURES AT LABIA AND PERINEAL AREA; C/W LS  Musculoskeletal: Normal range of motion.  Neurological: She is alert and oriented to person, place, and time. No cranial nerve deficit.  Psychiatric: She has a normal mood and affect. Her behavior is normal. Judgment and thought content normal.  Vitals reviewed.   Assessment/Plan: Lichen sclerosus of female genitalia -   - Plan: clobetasol cream (TEMOVATE) 0.05 % Discussed tx regimen with temovate crm. Suggest QOD for 4 wks, then twice wkly as maintenance. Ok to use crm regularly. Discussed small risk of vag cancer with LS, decreased if tx with temovate regularly. Recommended yearly inspection. Also suggested not wearing support hose/printout from up-to-date on vulvar hygiene given to pt. Rx RF temovate for pt. F/u prn.    Meds ordered this encounter  Medications  . clobetasol cream (TEMOVATE) 0.05 %    Sig: Apply topically 2 (two) times a week.    Dispense:  30 g    Refill:  1    Please consider 90 day supplies to promote better adherence    Order Specific Question:   Supervising Provider    Answer:    Gae Dry [[id number]]      Return in about 1 year (around 07/29/2019).  Alicia B. Copland, PA-C 07/28/2018 3:17 PM

## 2018-07-30 ENCOUNTER — Telehealth: Payer: Self-pay

## 2018-07-30 NOTE — Telephone Encounter (Signed)
Pt calling to see if rx sent to pharm.  [phone]  Left detailed msg rx sent in on the 28th and it was received by the pharm.

## 2018-08-25 ENCOUNTER — Ambulatory Visit (INDEPENDENT_AMBULATORY_CARE_PROVIDER_SITE_OTHER): Payer: Medicare HMO | Admitting: Family Medicine

## 2018-08-25 ENCOUNTER — Other Ambulatory Visit: Payer: Self-pay

## 2018-08-25 ENCOUNTER — Encounter: Payer: Self-pay | Admitting: Family Medicine

## 2018-08-25 ENCOUNTER — Ambulatory Visit
Admission: RE | Admit: 2018-08-25 | Discharge: 2018-08-25 | Disposition: A | Discharge status: Home / Self Care | Payer: Medicare HMO | Source: Ambulatory Visit | Attending: Family Medicine | Admitting: Family Medicine

## 2018-08-25 VITALS — BP 122/71 | HR 94 | Temp 98.0°F | Ht 62.0 in | Wt 107.0 lb

## 2018-08-25 DIAGNOSIS — M25531 Pain in right wrist: Secondary | ICD-10-CM

## 2018-08-25 DIAGNOSIS — M542 Cervicalgia: Secondary | ICD-10-CM | POA: Diagnosis not present

## 2018-08-25 DIAGNOSIS — J309 Allergic rhinitis, unspecified: Secondary | ICD-10-CM

## 2018-08-25 MED ORDER — FLUTICASONE PROPIONATE 50 MCG/ACT NA SUSP: 1.0000 | Freq: Two times a day (BID) | NASAL | 11 refills | Status: DC

## 2018-08-25 MED ORDER — CETIRIZINE HCL 10 MG PO TABS: 10.0000 mg | ORAL_TABLET | Freq: Every day | ORAL | 11 refills | Status: DC

## 2018-08-25 MED ORDER — CYCLOBENZAPRINE HCL 5 MG PO TABS: 5.0000 mg | ORAL_TABLET | Freq: Every evening | ORAL | 1 refills | Status: DC | PRN

## 2018-08-25 NOTE — Progress Notes (Signed)
BP 122/71   Pulse 94   Temp 98 F (36.7 C) (Oral)   Ht 5\' 2"  (1.575 m)   Wt 107 lb (48.5 kg)   SpO2 97%   BMI 19.57 kg/m    Subjective:    Patient ID: Kayla Johnson, female    DOB: 1946/01/19, 72 y.o.   MRN: [id number]  HPI: Kayla Johnson is a 72 y.o. female  Chief Complaint  Patient presents with  . Neck Pain    bilateral x 2 weeks/ pt states pain radiates up to the back of ears   B/l neck soreness and ear pain x 2 weeks, waxes and wanes. Hx of neck issues and pain radiating from neck down right arm. The right arm pain is a long-term issue for her under poor control. Also states she can hear popping and friction noises when she turns her neck side to side. Denies numbness or tingling to hands, known injury to neck, fevers, chills. Taking OTC pain relievers with minimal relief.   Does also feel like her ears are full and can feel water move around when she turns her head. Hx of allergies, not currently on medication.   Right wrist pain and mass noticed several weeks ago. No known injury. Trying ice and heat and OTC pain relievers with no relief.   Relevant past medical, surgical, family and social history reviewed and updated as indicated. Interim medical history since our last visit reviewed. Allergies and medications reviewed and updated.  Review of Systems  Per HPI unless specifically indicated above     Objective:    BP 122/71   Pulse 94   Temp 98 F (36.7 C) (Oral)   Ht 5\' 2"  (1.575 m)   Wt 107 lb (48.5 kg)   SpO2 97%   BMI 19.57 kg/m   Wt Readings from Last 3 Encounters:  08/25/18 107 lb (48.5 kg)  07/28/18 106 lb (48.1 kg)  04/14/18 105 lb 12.8 oz (48 kg)    Physical Exam  Constitutional: She is oriented to person, place, and time. She appears well-developed and well-nourished. No distress.  HENT:  Head: Atraumatic.  Nose: Nose normal.  Mouth/Throat: Oropharynx is clear and moist.  Minimal middle ear effusion b/l  Eyes: Conjunctivae and EOM are  normal.  Neck:  Crepitus of neck with ROM B/l trapezius ttp  Cardiovascular: Normal rate and intact distal pulses.  Pulmonary/Chest: Effort normal and breath sounds normal.  Musculoskeletal: Normal range of motion.  Grip strength full and equal b/l UEs  Firm enlarged area palpable on right wrist  Neurological: She is alert and oriented to person, place, and time. No sensory deficit. She exhibits normal muscle tone.  Skin: Skin is warm and dry.  Psychiatric: She has a normal mood and affect. Her behavior is normal.  Nursing note and vitals reviewed.   Results for orders placed or performed in visit on 04/14/18  Vitamin B12  Result Value Ref Range   Vitamin B-12 391 232 - 1,245 pg/mL  CBC with Differential/Platelet  Result Value Ref Range   WBC 4.3 3.4 - 10.8 x10E3/uL   RBC 4.44 3.77 - 5.28 x10E6/uL   Hemoglobin 11.9 11.1 - 15.9 g/dL   Hematocrit 36.4 34.0 - 46.6 %   MCV 82 79 - 97 fL   MCH 26.8 26.6 - 33.0 pg   MCHC 32.7 31.5 - 35.7 g/dL   RDW 16.6 (H) 12.3 - 15.4 %   Platelets 369 150 - 379 x10E3/uL  Neutrophils 47 Not Estab. %   Lymphs 40 Not Estab. %   Monocytes 10 Not Estab. %   Eos 2 Not Estab. %   Basos 1 Not Estab. %   Neutrophils Absolute 2.0 1.4 - 7.0 x10E3/uL   Lymphocytes Absolute 1.7 0.7 - 3.1 x10E3/uL   Monocytes Absolute 0.4 0.1 - 0.9 x10E3/uL   EOS (ABSOLUTE) 0.1 0.0 - 0.4 x10E3/uL   Basophils Absolute 0.0 0.0 - 0.2 x10E3/uL   Immature Granulocytes 0 Not Estab. %   Immature Grans (Abs) 0.0 0.0 - 0.1 x10E3/uL  Lipid Panel w/o Chol/HDL Ratio  Result Value Ref Range   Cholesterol, Total 207 (H) 100 - 199 mg/dL   Triglycerides 75 0 - 149 mg/dL   HDL 64 >39 mg/dL   VLDL Cholesterol Cal 15 5 - 40 mg/dL   LDL Calculated 128 (H) 0 - 99 mg/dL      Assessment & Plan:   Problem List Items Addressed This Visit      Respiratory   Allergic rhinitis    Start zyrtec and flonase for better control, monitor for improvement in inner ear sxs       Other Visit  Diagnoses    Acute pain of right wrist    -  Primary   Will obtain x-ray and proceed depending on results   Relevant Orders   DG Wrist Complete Right (Completed)   Neck pain       Will obtain x-ray. Start low dose flexeril at bedtime as well as continued heat pad, OTC pain relievers   Relevant Orders   DG Cervical Spine Complete (Completed)       Follow up plan: Return if symptoms worsen or fail to improve.

## 2018-08-25 NOTE — Patient Instructions (Signed)
[facility] [place] [facility] [address], [place], [place] [address]   

## 2018-08-26 ENCOUNTER — Telehealth: Payer: Self-pay | Admitting: Family Medicine

## 2018-08-26 DIAGNOSIS — M25531 Pain in right wrist: Secondary | ICD-10-CM

## 2018-08-26 DIAGNOSIS — M542 Cervicalgia: Secondary | ICD-10-CM

## 2018-08-26 NOTE — Telephone Encounter (Signed)
Please let her know that I'm placing a referral to orthopedics to manage her neck and arm issue.   The neck x-ray showed some degeneration that may merit further testing and the wrist x-ray showed some bony enlargement there that is likely benign but I would like orthopedics to review it

## 2018-08-27 NOTE — Telephone Encounter (Signed)
Called pt, no answer. Left VM, relayed detailed message to pt (checked DPR) and advised pt to give [place] a call if she has any questions.

## 2018-08-27 NOTE — Assessment & Plan Note (Signed)
Start zyrtec and flonase for better control, monitor for improvement in inner ear sxs

## 2018-09-15 DIAGNOSIS — M79601 Pain in right arm: Secondary | ICD-10-CM | POA: Diagnosis not present

## 2018-09-15 DIAGNOSIS — M50322 Other cervical disc degeneration at C5-C6 level: Secondary | ICD-10-CM | POA: Diagnosis not present

## 2018-09-15 DIAGNOSIS — M50323 Other cervical disc degeneration at C6-C7 level: Secondary | ICD-10-CM | POA: Diagnosis not present

## 2018-09-15 DIAGNOSIS — M25431 Effusion, right wrist: Secondary | ICD-10-CM | POA: Diagnosis not present

## 2018-09-15 DIAGNOSIS — M542 Cervicalgia: Secondary | ICD-10-CM | POA: Diagnosis not present

## 2018-09-15 DIAGNOSIS — M4722 Other spondylosis with radiculopathy, cervical region: Secondary | ICD-10-CM | POA: Diagnosis not present

## 2018-09-15 DIAGNOSIS — M25531 Pain in right wrist: Secondary | ICD-10-CM | POA: Diagnosis not present

## 2018-09-15 DIAGNOSIS — G8929 Other chronic pain: Secondary | ICD-10-CM | POA: Diagnosis not present

## 2018-10-20 DIAGNOSIS — M25431 Effusion, right wrist: Secondary | ICD-10-CM | POA: Diagnosis not present

## 2018-10-20 DIAGNOSIS — M25531 Pain in right wrist: Secondary | ICD-10-CM | POA: Diagnosis not present

## 2018-10-20 DIAGNOSIS — M79601 Pain in right arm: Secondary | ICD-10-CM | POA: Diagnosis not present

## 2018-11-26 ENCOUNTER — Ambulatory Visit: Payer: Medicare HMO | Admitting: Family Medicine

## 2019-04-20 ENCOUNTER — Ambulatory Visit: Payer: Self-pay

## 2019-04-27 DIAGNOSIS — M542 Cervicalgia: Secondary | ICD-10-CM | POA: Diagnosis not present

## 2019-04-27 DIAGNOSIS — M47812 Spondylosis without myelopathy or radiculopathy, cervical region: Secondary | ICD-10-CM | POA: Diagnosis not present

## 2019-04-27 DIAGNOSIS — M50323 Other cervical disc degeneration at C6-C7 level: Secondary | ICD-10-CM | POA: Diagnosis not present

## 2019-04-27 DIAGNOSIS — G8929 Other chronic pain: Secondary | ICD-10-CM | POA: Diagnosis not present

## 2019-04-27 DIAGNOSIS — M50322 Other cervical disc degeneration at C5-C6 level: Secondary | ICD-10-CM | POA: Diagnosis not present

## 2019-05-04 DIAGNOSIS — M542 Cervicalgia: Secondary | ICD-10-CM | POA: Diagnosis not present

## 2019-05-04 DIAGNOSIS — M47812 Spondylosis without myelopathy or radiculopathy, cervical region: Secondary | ICD-10-CM | POA: Diagnosis not present

## 2019-05-11 ENCOUNTER — Ambulatory Visit: Payer: Medicare HMO

## 2019-05-11 DIAGNOSIS — M542 Cervicalgia: Secondary | ICD-10-CM | POA: Diagnosis not present

## 2019-05-18 DIAGNOSIS — M542 Cervicalgia: Secondary | ICD-10-CM | POA: Diagnosis not present

## 2019-05-25 DIAGNOSIS — M542 Cervicalgia: Secondary | ICD-10-CM | POA: Diagnosis not present

## 2019-06-01 DIAGNOSIS — M542 Cervicalgia: Secondary | ICD-10-CM | POA: Diagnosis not present

## 2019-06-15 ENCOUNTER — Other Ambulatory Visit: Payer: Self-pay

## 2019-06-15 ENCOUNTER — Encounter: Payer: Self-pay | Admitting: Family Medicine

## 2019-06-15 ENCOUNTER — Ambulatory Visit (INDEPENDENT_AMBULATORY_CARE_PROVIDER_SITE_OTHER): Payer: Medicare HMO | Admitting: Family Medicine

## 2019-06-15 VITALS — BP 122/62 | HR 86 | Temp 99.0°F | Ht 61.5 in | Wt 106.8 lb

## 2019-06-15 DIAGNOSIS — J309 Allergic rhinitis, unspecified: Secondary | ICD-10-CM | POA: Diagnosis not present

## 2019-06-15 DIAGNOSIS — M542 Cervicalgia: Secondary | ICD-10-CM | POA: Diagnosis not present

## 2019-06-15 DIAGNOSIS — E7800 Pure hypercholesterolemia, unspecified: Secondary | ICD-10-CM | POA: Diagnosis not present

## 2019-06-15 DIAGNOSIS — L509 Urticaria, unspecified: Secondary | ICD-10-CM | POA: Diagnosis not present

## 2019-06-15 DIAGNOSIS — D510 Vitamin B12 deficiency anemia due to intrinsic factor deficiency: Secondary | ICD-10-CM | POA: Diagnosis not present

## 2019-06-15 DIAGNOSIS — Z0000 Encounter for general adult medical examination without abnormal findings: Secondary | ICD-10-CM | POA: Diagnosis not present

## 2019-06-15 DIAGNOSIS — Z1211 Encounter for screening for malignant neoplasm of colon: Secondary | ICD-10-CM

## 2019-06-15 MED ORDER — FLUTICASONE PROPIONATE 50 MCG/ACT NA SUSP: 1.0000 | Freq: Two times a day (BID) | NASAL | 11 refills | Status: DC

## 2019-06-15 MED ORDER — TRIAMCINOLONE ACETONIDE 0.1 % EX CREA: TOPICAL_CREAM | CUTANEOUS | 1 refills | Status: DC

## 2019-06-15 MED ORDER — CETIRIZINE HCL 10 MG PO TABS: 10.0000 mg | ORAL_TABLET | Freq: Every day | ORAL | 11 refills | Status: DC

## 2019-06-15 NOTE — Patient Instructions (Signed)
Hydrocortisone cream 1% over the counter is great for private areas and facial itching

## 2019-06-15 NOTE — Progress Notes (Signed)
BP 122/62   Pulse 86   Temp 99 F (37.2 C) (Oral)   Ht 5' 1.5" (1.562 m)   Wt 106 lb 12.8 oz (48.4 kg)   SpO2 96%   BMI 19.85 kg/m    Subjective:    Patient ID: Kayla Johnson, female    DOB: 12-Sep-1946, 73 y.o.   MRN: [id number]  HPI: Kayla Johnson is a 73 y.o. female presenting on 06/15/2019 for comprehensive medical examination. Current medical complaints include:see below  Pernicious anemia - Stopped vit B12 supplements several months ago, just kind of forgot about doing them. Feeling lethargic sometimes, weak and tired.   Neck pain somewhat better since starting PT, still taking the muscle relaxers prn.   Has occasional itching on her legs and groin area, using steroid cream prn which helps right away. Does not use every day.   Stopped taking zyrtec and flonase "quite a while ago". Now intermittently having tickling feeling in ears and sneezes.Occasional congestion.   She currently lives with: Menopausal Symptoms: no  Depression Screen done today and results listed below:  Depression screen [facility] 2/9 06/15/2019 04/14/2018 04/14/2018 12/31/2016 12/12/2015  Decreased Interest 0 0 0 0 0  Down, Depressed, Hopeless 0 0 0 0 0  PHQ - 2 Score 0 0 0 0 0  Altered sleeping 0 0 - 0 -  Tired, decreased energy 1 0 - 0 -  Change in appetite 0 0 - 0 -  Feeling bad or failure about yourself  0 0 - 0 -  Trouble concentrating 0 0 - 0 -  Moving slowly or fidgety/restless 0 0 - 0 -  Suicidal thoughts 0 0 - 0 -  PHQ-9 Score 1 0 - 0 -    The patient does not have a history of falls. I did complete a risk assessment for falls. A plan of care for falls was documented.   Past Medical History:  Past Medical History:  Diagnosis Date  . Lichen sclerosus   . Pernicious anemia   . Shoulder bursitis     Surgical History:  Past Surgical History:  Procedure Laterality Date  . Molar Pregnancy     S/P D & C  . TONSILLECTOMY    . Tubes & Ovaries removed  2015    Medications:  Current  Outpatient Medications on File Prior to Visit  Medication Sig  . cyclobenzaprine (FLEXERIL) 5 MG tablet Take 1 tablet (5 mg total) by mouth at bedtime as needed for muscle spasms.   Current Facility-Administered Medications on File Prior to Visit  Medication  . cyanocobalamin ((VITAMIN B-12)) injection 1,000 mcg  . cyanocobalamin ((VITAMIN B-12)) injection 1,000 mcg    Allergies:  No Known Allergies  Social History:  Social History   Socioeconomic History  . Marital status: Widowed    Spouse name: Not on file  . Number of children: Not on file  . Years of education: Not on file  . Highest education level: Not on file  Occupational History  . Not on file  Social Needs  . Financial resource strain: Not hard at all  . Food insecurity    Worry: Never true    Inability: Never true  . Transportation needs    Medical: No    Non-medical: No  Tobacco Use  . Smoking status: Never Smoker  . Smokeless tobacco: Never Used  Substance and Sexual Activity  . Alcohol use: No    Alcohol/week: 0.0 standard drinks  . Drug use: No  .  Sexual activity: Not Currently    Birth control/protection: Post-menopausal  Lifestyle  . Physical activity    Days per week: 0 days    Minutes per session: 0 min  . Stress: Not at all  Relationships  . Social connections    Talks on phone: More than three times a week    Gets together: More than three times a week    Attends religious service: More than 4 times per year    Active member of club or organization: No    Attends meetings of clubs or organizations: Never    Relationship status: Widowed  . Intimate partner violence    Fear of current or ex partner: No    Emotionally abused: No    Physically abused: No    Forced sexual activity: No  Other Topics Concern  . Not on file  Social History Narrative   Works 32 hours a week at [employer] Use  Smoking Status Never Smoker  Smokeless Tobacco Never Used   Social  History   Substance and Sexual Activity  Alcohol Use No  . Alcohol/week: 0.0 standard drinks    Family History:  Family History  Problem Relation Age of Onset  . Arthritis Mother   . Diabetes Mother   . Arthritis Father   . Diabetes Sister   . Stroke Sister   . Diabetes Sister   . Ovarian cancer Sister     Past medical history, surgical history, medications, allergies, family history and social history reviewed with patient today and changes made to appropriate areas of the chart.   Review of Systems - General ROS: positive for  - fatigue Psychological ROS: negative Ophthalmic ROS: negative ENT ROS: negative Allergy and Immunology ROS: negative Hematological and Lymphatic ROS: negative Endocrine ROS: negative Breast ROS: negative for breast lumps Respiratory ROS: no cough, shortness of breath, or wheezing Cardiovascular ROS: no chest pain or dyspnea on exertion Gastrointestinal ROS: no abdominal pain, change in bowel habits, or black or bloody stools Genito-Urinary ROS: no dysuria, trouble voiding, or hematuria Musculoskeletal ROS: negative Neurological ROS: no TIA or stroke symptoms Dermatological ROS: negative All other ROS negative except what is listed above and in the HPI.      Objective:    BP 122/62   Pulse 86   Temp 99 F (37.2 C) (Oral)   Ht 5' 1.5" (1.562 m)   Wt 106 lb 12.8 oz (48.4 kg)   SpO2 96%   BMI 19.85 kg/m   Wt Readings from Last 3 Encounters:  06/15/19 106 lb 12.8 oz (48.4 kg)  08/25/18 107 lb (48.5 kg)  07/28/18 106 lb (48.1 kg)    Physical Exam Vitals signs and nursing note reviewed.  Constitutional:      General: She is not in acute distress.    Appearance: She is well-developed.  HENT:     Head: Atraumatic.     Right Ear: External ear normal.     Left Ear: External ear normal.     Nose: Nose normal.     Mouth/Throat:     Pharynx: No oropharyngeal exudate.  Eyes:     General: No scleral icterus.    Conjunctiva/sclera:  Conjunctivae normal.     Pupils: Pupils are equal, round, and reactive to light.  Neck:     Musculoskeletal: Normal range of motion and neck supple.     Thyroid: No thyromegaly.  Cardiovascular:     Rate and Rhythm:  Normal rate and regular rhythm.     Heart sounds: Normal heart sounds.  Pulmonary:     Effort: Pulmonary effort is normal. No respiratory distress.     Breath sounds: Normal breath sounds.  Chest:     Breasts:        Right: No mass, skin change or tenderness.        Left: No mass, skin change or tenderness.  Abdominal:     General: Bowel sounds are normal.     Palpations: Abdomen is soft. There is no mass.     Tenderness: There is no abdominal tenderness.  Musculoskeletal: Normal range of motion.        General: No tenderness.  Lymphadenopathy:     Cervical: No cervical adenopathy.     Upper Body:     Right upper body: No axillary adenopathy.     Left upper body: No axillary adenopathy.  Skin:    General: Skin is warm and dry.     Findings: No rash.  Neurological:     Mental Status: She is alert and oriented to person, place, and time.     Cranial Nerves: No cranial nerve deficit.  Psychiatric:        Behavior: Behavior normal.     Results for orders placed or performed in visit on 06/15/19  Lipid Panel w/o Chol/HDL Ratio  Result Value Ref Range   Cholesterol, Total 213 (H) 100 - 199 mg/dL   Triglycerides 155 (H) 0 - 149 mg/dL   HDL 59 >39 mg/dL   VLDL Cholesterol Cal 31 5 - 40 mg/dL   LDL Calculated 123 (H) 0 - 99 mg/dL  Vitamin B12  Result Value Ref Range   Vitamin B-12 207 (L) 232 - 1,245 pg/mL  CBC with Differential/Platelet  Result Value Ref Range   WBC 5.2 3.4 - 10.8 x10E3/uL   RBC 4.68 3.77 - 5.28 x10E6/uL   Hemoglobin 12.2 11.1 - 15.9 g/dL   Hematocrit 37.6 34.0 - 46.6 %   MCV 80 79 - 97 fL   MCH 26.1 (L) 26.6 - 33.0 pg   MCHC 32.4 31.5 - 35.7 g/dL   RDW 15.8 (H) 11.7 - 15.4 %   Platelets 347 150 - 450 x10E3/uL   Neutrophils 54 Not Estab.  %   Lymphs 36 Not Estab. %   Monocytes 7 Not Estab. %   Eos 2 Not Estab. %   Basos 1 Not Estab. %   Neutrophils Absolute 2.8 1.4 - 7.0 x10E3/uL   Lymphocytes Absolute 1.9 0.7 - 3.1 x10E3/uL   Monocytes Absolute 0.4 0.1 - 0.9 x10E3/uL   EOS (ABSOLUTE) 0.1 0.0 - 0.4 x10E3/uL   Basophils Absolute 0.0 0.0 - 0.2 x10E3/uL   Immature Granulocytes 0 Not Estab. %   Immature Grans (Abs) 0.0 0.0 - 0.1 x10E3/uL      Assessment & Plan:   Problem List Items Addressed This Visit      Respiratory   Allergic rhinitis    Restart allergy regimen, monitor for benefit of her intermittent mild sxs        Musculoskeletal and Integument   Urticaria    Continue steroid creams prn, antihistamines to be re-added daily        Other   Pernicious anemia    Will recheck B12 level and CBC, restart B12 supplements      Relevant Orders   Vitamin B12 (Completed)   CBC with Differential/Platelet (Completed)    Other Visit Diagnoses  Annual physical exam    -  Primary   Hypercholesteremia       Recheck lipids, continue good lifestyle habits   Relevant Orders   Lipid Panel w/o Chol/HDL Ratio (Completed)   Screening for colon cancer       Relevant Orders   Cologuard   Neck pain       Improving with PT, continue flexeril and supportive care prn       Follow up plan: Return in about 1 year (around 06/14/2020) for CPE.   LABORATORY TESTING:  - Pap smear: not applicable  IMMUNIZATIONS:   - Tdap: Tetanus vaccination status reviewed: refused. - Influenza: Postponed to flu season - Pneumovax: Refused - Prevnar: Up to date - HPV: Not applicable - Zostavax vaccine: Refused  SCREENING: -Mammogram: Refused  - Colonoscopy: cologuard ordered  - Bone Density: Up to date   PATIENT COUNSELING:   Advised to take 1 mg of folate supplement per day if capable of pregnancy.   Sexuality: Discussed sexually transmitted diseases, partner selection, use of condoms, avoidance of unintended pregnancy  and  contraceptive alternatives.   Advised to avoid cigarette smoking.  I discussed with the patient that most people either abstain from alcohol or drink within safe limits (<=14/week and <=4 drinks/occasion for males, <=7/weeks and <= 3 drinks/occasion for females) and that the risk for alcohol disorders and other health effects rises proportionally with the number of drinks per week and how often a drinker exceeds daily limits.  Discussed cessation/primary prevention of drug use and availability of treatment for abuse.   Diet: Encouraged to adjust caloric intake to maintain  or achieve ideal body weight, to reduce intake of dietary saturated fat and total fat, to limit sodium intake by avoiding high sodium foods and not adding table salt, and to maintain adequate dietary potassium and calcium preferably from fresh fruits, vegetables, and low-fat dairy products.    stressed the importance of regular exercise  Injury prevention: Discussed safety belts, safety helmets, smoke detector, smoking near bedding or upholstery.   Dental health: Discussed importance of regular tooth brushing, flossing, and dental visits.    NEXT PREVENTATIVE PHYSICAL DUE IN 1 YEAR. Return in about 1 year (around 06/14/2020) for CPE.

## 2019-06-16 LAB — CBC WITH DIFFERENTIAL/PLATELET
Basophils Absolute: 0 10*3/uL (ref 0.0–0.2)
Basos: 1 %
EOS (ABSOLUTE): 0.1 10*3/uL (ref 0.0–0.4)
Eos: 2 %
Hematocrit: 37.6 % (ref 34.0–46.6)
Hemoglobin: 12.2 g/dL (ref 11.1–15.9)
Immature Grans (Abs): 0 10*3/uL (ref 0.0–0.1)
Immature Granulocytes: 0 %
Lymphocytes Absolute: 1.9 10*3/uL (ref 0.7–3.1)
Lymphs: 36 %
MCH: 26.1 pg — ABNORMAL LOW (ref 26.6–33.0)
MCHC: 32.4 g/dL (ref 31.5–35.7)
MCV: 80 fL (ref 79–97)
Monocytes Absolute: 0.4 10*3/uL (ref 0.1–0.9)
Monocytes: 7 %
Neutrophils Absolute: 2.8 10*3/uL (ref 1.4–7.0)
Neutrophils: 54 %
Platelets: 347 10*3/uL (ref 150–450)
RBC: 4.68 x10E6/uL (ref 3.77–5.28)
RDW: 15.8 % — ABNORMAL HIGH (ref 11.7–15.4)
WBC: 5.2 10*3/uL (ref 3.4–10.8)

## 2019-06-16 LAB — VITAMIN B12: Vitamin B-12: 207 pg/mL — ABNORMAL LOW (ref 232–1245)

## 2019-06-16 LAB — LIPID PANEL W/O CHOL/HDL RATIO
Cholesterol, Total: 213 mg/dL — ABNORMAL HIGH (ref 100–199)
HDL: 59 mg/dL (ref >39)
LDL Calculated: 123 mg/dL — ABNORMAL HIGH (ref 0–99)
Triglycerides: 155 mg/dL — ABNORMAL HIGH (ref 0–149)
VLDL Cholesterol Cal: 31 mg/dL (ref 5–40)

## 2019-06-20 NOTE — Assessment & Plan Note (Signed)
Restart allergy regimen, monitor for benefit of her intermittent mild sxs

## 2019-06-20 NOTE — Assessment & Plan Note (Signed)
Will recheck B12 level and CBC, restart B12 supplements

## 2019-06-20 NOTE — Assessment & Plan Note (Signed)
Continue steroid creams prn, antihistamines to be re-added daily

## 2019-06-23 ENCOUNTER — Telehealth: Payer: Self-pay | Admitting: Family Medicine

## 2019-06-23 NOTE — Telephone Encounter (Signed)
Medication: cyclobenzaprine (FLEXERIL) 5 MG tablet    Patient is requesting a refill of this medication.    Pharmacy:  [facility] [address]), [place] [phone] (Phone) [phone] (Fax)

## 2019-06-24 MED ORDER — CYCLOBENZAPRINE HCL 5 MG PO TABS: 5.0000 mg | ORAL_TABLET | Freq: Every evening | ORAL | 1 refills | Status: DC | PRN

## 2019-06-24 NOTE — Telephone Encounter (Signed)
Rx sent 

## 2019-06-29 DIAGNOSIS — Z1211 Encounter for screening for malignant neoplasm of colon: Secondary | ICD-10-CM | POA: Diagnosis not present

## 2019-06-29 LAB — COLOGUARD: Cologuard: NEGATIVE

## 2019-07-01 DIAGNOSIS — H524 Presbyopia: Secondary | ICD-10-CM | POA: Diagnosis not present

## 2019-07-01 DIAGNOSIS — H5213 Myopia, bilateral: Secondary | ICD-10-CM | POA: Diagnosis not present

## 2019-07-01 DIAGNOSIS — H2513 Age-related nuclear cataract, bilateral: Secondary | ICD-10-CM | POA: Diagnosis not present

## 2019-07-01 DIAGNOSIS — H1851 Endothelial corneal dystrophy: Secondary | ICD-10-CM | POA: Diagnosis not present

## 2019-07-06 DIAGNOSIS — R69 Illness, unspecified: Secondary | ICD-10-CM | POA: Diagnosis not present

## 2019-08-17 ENCOUNTER — Telehealth: Payer: Self-pay | Admitting: Family Medicine

## 2019-08-17 NOTE — Telephone Encounter (Signed)
Retrieved results from [employer] and test was negative 06/29/2019. Copy printed. Will notify patient

## 2019-08-17 NOTE — Telephone Encounter (Signed)
Can you please look into her results and let pt know?  Copied from [place] [phone]. Topic: General - Other >> Aug 17, 2019  2:42 PM Celene Kras A wrote: Reason for CRM: PT called and is requesting to have her cologuard results through my chart. Please advise.

## 2019-08-18 NOTE — Telephone Encounter (Signed)
Patient notified

## 2019-09-06 ENCOUNTER — Encounter: Payer: Self-pay | Admitting: Family Medicine

## 2019-09-23 ENCOUNTER — Encounter: Payer: Self-pay | Admitting: Family Medicine

## 2019-09-23 ENCOUNTER — Ambulatory Visit (INDEPENDENT_AMBULATORY_CARE_PROVIDER_SITE_OTHER): Payer: Medicare HMO | Admitting: Family Medicine

## 2019-09-23 ENCOUNTER — Other Ambulatory Visit: Payer: Self-pay

## 2019-09-23 ENCOUNTER — Ambulatory Visit: Payer: Medicare HMO | Admitting: Family Medicine

## 2019-09-23 VITALS — BP 145/76 | HR 95 | Temp 99.2°F

## 2019-09-23 DIAGNOSIS — R5383 Other fatigue: Secondary | ICD-10-CM

## 2019-09-23 DIAGNOSIS — M79641 Pain in right hand: Secondary | ICD-10-CM

## 2019-09-23 DIAGNOSIS — M79642 Pain in left hand: Secondary | ICD-10-CM

## 2019-09-23 DIAGNOSIS — R200 Anesthesia of skin: Secondary | ICD-10-CM

## 2019-09-23 DIAGNOSIS — D510 Vitamin B12 deficiency anemia due to intrinsic factor deficiency: Secondary | ICD-10-CM

## 2019-09-23 DIAGNOSIS — E559 Vitamin D deficiency, unspecified: Secondary | ICD-10-CM | POA: Diagnosis not present

## 2019-09-23 MED ORDER — DICLOFENAC SODIUM 1 % TD GEL: 2.0000 g | Freq: Four times a day (QID) | TRANSDERMAL | 0 refills | Status: DC | PRN

## 2019-09-23 NOTE — Progress Notes (Signed)
BP (!) 145/76   Pulse 95   Temp 99.2 F (37.3 C)   SpO2 97%    Subjective:    Patient ID: Kayla Johnson, female    DOB: Nov 14, 1946, 72 y.o.   MRN: [id number]  HPI: Kayla Johnson is a 73 y.o. female  Chief Complaint  Patient presents with  . Numbness    and pain in the hand, states that her hands will get extremely cold as well   About a week of having 3 isolated episodes of hand pain, numbness and tingling worst on the left side. Always struggles with cold hands but feels it may be worse right now. Did have some shoulder pain last week but that seems to have eased off. Denies swelling, injury, wrist pain, hand weakness. Not trying anything OTC for relief.   Relevant past medical, surgical, family and social history reviewed and updated as indicated. Interim medical history since our last visit reviewed. Allergies and medications reviewed and updated.  Review of Systems  Per HPI unless specifically indicated above     Objective:    BP (!) 145/76   Pulse 95   Temp 99.2 F (37.3 C)   SpO2 97%   Wt Readings from Last 3 Encounters:  06/15/19 106 lb 12.8 oz (48.4 kg)  08/25/18 107 lb (48.5 kg)  07/28/18 106 lb (48.1 kg)    Physical Exam Vitals signs and nursing note reviewed.  Constitutional:      Appearance: Normal appearance. She is not ill-appearing.  HENT:     Head: Atraumatic.  Eyes:     Extraocular Movements: Extraocular movements intact.     Conjunctiva/sclera: Conjunctivae normal.  Neck:     Musculoskeletal: Normal range of motion and neck supple.  Cardiovascular:     Rate and Rhythm: Normal rate and regular rhythm.     Pulses: Normal pulses.     Heart sounds: Normal heart sounds.  Pulmonary:     Effort: Pulmonary effort is normal.     Breath sounds: Normal breath sounds.  Musculoskeletal: Normal range of motion.  Skin:    General: Skin is warm and dry.     Coloration: Skin is not pale.     Findings: No erythema.  Neurological:     Mental Status: She  is alert and oriented to person, place, and time.     Sensory: No sensory deficit.     Motor: No weakness.  Psychiatric:        Mood and Affect: Mood normal.        Thought Content: Thought content normal.        Judgment: Judgment normal.     Results for orders placed or performed in visit on 09/23/19  Vitamin B12  Result Value Ref Range   Vitamin B-12 >2000 (H) 232 - 1245 pg/mL  VITAMIN D 25 Hydroxy (Vit-D Deficiency, Fractures)  Result Value Ref Range   Vit D, 25-Hydroxy 16.7 (L) 30.0 - 100.0 ng/mL  TSH  Result Value Ref Range   TSH 1.630 0.450 - 4.500 uIU/mL  CBC with Differential/Platelet  Result Value Ref Range   WBC 4.4 3.4 - 10.8 x10E3/uL   RBC 4.49 3.77 - 5.28 x10E6/uL   Hemoglobin 11.4 11.1 - 15.9 g/dL   Hematocrit 34.9 34.0 - 46.6 %   MCV 78 (L) 79 - 97 fL   MCH 25.4 (L) 26.6 - 33.0 pg   MCHC 32.7 31.5 - 35.7 g/dL   RDW 15.8 (H) 11.7 - 15.4 %  Platelets 354 150 - 450 x10E3/uL   Neutrophils 48 Not Estab. %   Lymphs 40 Not Estab. %   Monocytes 9 Not Estab. %   Eos 2 Not Estab. %   Basos 1 Not Estab. %   Neutrophils Absolute 2.2 1.4 - 7.0 x10E3/uL   Lymphocytes Absolute 1.7 0.7 - 3.1 x10E3/uL   Monocytes Absolute 0.4 0.1 - 0.9 x10E3/uL   EOS (ABSOLUTE) 0.1 0.0 - 0.4 x10E3/uL   Basophils Absolute 0.0 0.0 - 0.2 x10E3/uL   Immature Granulocytes 0 Not Estab. %   Immature Grans (Abs) 0.0 0.0 - 0.1 x10E3/uL      Assessment & Plan:   Problem List Items Addressed This Visit      Other   Pernicious anemia    Recently restarted on B12 supplements. Will check levels to ensure adequate supplementation and to r/o deficiency causing paresthesias.       Relevant Orders   Vitamin B12 (Completed)   CBC with Differential/Platelet (Completed)   Fatigue    Await lab results      Relevant Orders   VITAMIN D 25 Hydroxy (Vit-D Deficiency, Fractures) (Completed)   TSH (Completed)    Other Visit Diagnoses    Pain in both hands    -  Primary   Voltaren gel, epsom  salt soaks, keep hands warm and mobile. Await lab results particularly given numbness. Possible radiculopathy from shoulder    Numbness       Relevant Orders   VITAMIN D 25 Hydroxy (Vit-D Deficiency, Fractures) (Completed)       Follow up plan: Return for as scheduled.

## 2019-09-24 LAB — CBC WITH DIFFERENTIAL/PLATELET
Basophils Absolute: 0 10*3/uL (ref 0.0–0.2)
Basos: 1 %
EOS (ABSOLUTE): 0.1 10*3/uL (ref 0.0–0.4)
Eos: 2 %
Hematocrit: 34.9 % (ref 34.0–46.6)
Hemoglobin: 11.4 g/dL (ref 11.1–15.9)
Immature Grans (Abs): 0 10*3/uL (ref 0.0–0.1)
Immature Granulocytes: 0 %
Lymphocytes Absolute: 1.7 10*3/uL (ref 0.7–3.1)
Lymphs: 40 %
MCH: 25.4 pg — ABNORMAL LOW (ref 26.6–33.0)
MCHC: 32.7 g/dL (ref 31.5–35.7)
MCV: 78 fL — ABNORMAL LOW (ref 79–97)
Monocytes Absolute: 0.4 10*3/uL (ref 0.1–0.9)
Monocytes: 9 %
Neutrophils Absolute: 2.2 10*3/uL (ref 1.4–7.0)
Neutrophils: 48 %
Platelets: 354 10*3/uL (ref 150–450)
RBC: 4.49 x10E6/uL (ref 3.77–5.28)
RDW: 15.8 % — ABNORMAL HIGH (ref 11.7–15.4)
WBC: 4.4 10*3/uL (ref 3.4–10.8)

## 2019-09-24 LAB — VITAMIN D 25 HYDROXY (VIT D DEFICIENCY, FRACTURES): Vit D, 25-Hydroxy: 16.7 ng/mL — ABNORMAL LOW (ref 30.0–100.0)

## 2019-09-24 LAB — VITAMIN B12: Vitamin B-12: >2000 pg/mL — ABNORMAL HIGH (ref 232–1245)

## 2019-09-24 LAB — TSH: TSH: 1.63 u[IU]/mL (ref 0.450–4.500)

## 2019-09-28 ENCOUNTER — Other Ambulatory Visit: Payer: Self-pay | Admitting: Family Medicine

## 2019-09-28 MED ORDER — VITAMIN D (ERGOCALCIFEROL) 1.25 MG (50000 UNIT) PO CAPS: 50000.0000 [IU] | ORAL_CAPSULE | ORAL | 3 refills | Status: DC

## 2019-09-28 NOTE — Assessment & Plan Note (Signed)
Recently restarted on B12 supplements. Will check levels to ensure adequate supplementation and to r/o deficiency causing paresthesias.

## 2019-09-28 NOTE — Assessment & Plan Note (Signed)
Await lab results. 

## 2019-10-12 ENCOUNTER — Other Ambulatory Visit: Payer: Self-pay

## 2019-10-12 ENCOUNTER — Ambulatory Visit (INDEPENDENT_AMBULATORY_CARE_PROVIDER_SITE_OTHER): Payer: Medicare HMO | Admitting: Family Medicine

## 2019-10-12 ENCOUNTER — Encounter: Payer: Self-pay | Admitting: Family Medicine

## 2019-10-12 VITALS — BP 117/72 | HR 76 | Temp 98.5°F | Ht 61.5 in | Wt 108.0 lb

## 2019-10-12 DIAGNOSIS — M79674 Pain in right toe(s): Secondary | ICD-10-CM

## 2019-10-12 NOTE — Progress Notes (Signed)
BP 117/72   Pulse 76   Temp 98.5 F (36.9 C) (Oral)   Ht 5' 1.5" (1.562 m)   Wt 108 lb (49 kg)   SpO2 96%   BMI 20.08 kg/m    Subjective:    Patient ID: Kayla Johnson, female    DOB: [birth year], 73 y.o.   MRN: [id number]  HPI: [name] is a 73 y.o. female  Chief Complaint  Patient presents with  . Toe Pain    small bump on right foot second toe from the last. notices years ago. pt states hurts with walking.   Patient here today with a small painful area on the bottom of her right 4th toe. This area has been present for years but becoming more bothersome. States she's had it removed in office before but it came right back. Hurts to bear weight on it. Has tried keeping it wrapped up, changing shoes without relief.   Relevant past medical, surgical, family and social history reviewed and updated as indicated. Interim medical history since our last visit reviewed. Allergies and medications reviewed and updated.  Review of Systems  Per HPI unless specifically indicated above     Objective:    BP 117/72   Pulse 76   Temp 98.5 F (36.9 C) (Oral)   Ht 5' 1.5" (1.562 m)   Wt 108 lb (49 kg)   SpO2 96%   BMI 20.08 kg/m   Wt Readings from Last 3 Encounters:  10/12/19 108 lb (49 kg)  06/15/19 106 lb 12.8 oz (48.4 kg)  08/25/18 107 lb (48.5 kg)    Physical Exam Vitals signs and nursing note reviewed.  Constitutional:      Appearance: Normal appearance. She is not ill-appearing.  HENT:     Head: Atraumatic.  Eyes:     Extraocular Movements: Extraocular movements intact.     Conjunctiva/sclera: Conjunctivae normal.  Neck:     Musculoskeletal: Normal range of motion and neck supple.  Cardiovascular:     Rate and Rhythm: Normal rate and regular rhythm.     Heart sounds: Normal heart sounds.  Pulmonary:     Effort: Pulmonary effort is normal.     Breath sounds: Normal breath sounds.  Musculoskeletal: Normal range of motion.  Skin:    General: Skin is warm and dry.      Comments: Firm round smooth mass like area on right 4th toe pad, ttp  Neurological:     Mental Status: She is alert and oriented to person, place, and time.  Psychiatric:        Mood and Affect: Mood normal.        Thought Content: Thought content normal.        Judgment: Judgment normal.     Results for orders placed or performed in visit on 09/23/19  Vitamin B12  Result Value Ref Range   Vitamin B-12 >2000 (H) 232 - 1245 pg/mL  VITAMIN D 25 Hydroxy (Vit-D Deficiency, Fractures)  Result Value Ref Range   Vit D, 25-Hydroxy 16.7 (L) 30.0 - 100.0 ng/mL  TSH  Result Value Ref Range   TSH 1.630 0.450 - 4.500 uIU/mL  CBC with Differential/Platelet  Result Value Ref Range   WBC 4.4 3.4 - 10.8 x10E3/uL   RBC 4.49 3.77 - 5.28 x10E6/uL   Hemoglobin 11.4 11.1 - 15.9 g/dL   Hematocrit 34.9 34.0 - 46.6 %   MCV 78 (L) 79 - 97 fL   MCH 25.4 (L) 26.6 -  33.0 pg   MCHC 32.7 31.5 - 35.7 g/dL   RDW 15.8 (H) 11.7 - 15.4 %   Platelets 354 150 - 450 x10E3/uL   Neutrophils 48 Not Estab. %   Lymphs 40 Not Estab. %   Monocytes 9 Not Estab. %   Eos 2 Not Estab. %   Basos 1 Not Estab. %   Neutrophils Absolute 2.2 1.4 - 7.0 x10E3/uL   Lymphocytes Absolute 1.7 0.7 - 3.1 x10E3/uL   Monocytes Absolute 0.4 0.1 - 0.9 x10E3/uL   EOS (ABSOLUTE) 0.1 0.0 - 0.4 x10E3/uL   Basophils Absolute 0.0 0.0 - 0.2 x10E3/uL   Immature Granulocytes 0 Not Estab. %   Immature Grans (Abs) 0.0 0.0 - 0.1 x10E3/uL      Assessment & Plan:   Problem List Items Addressed This Visit    None    Visit Diagnoses    Toe pain, right    -  Primary   Referral placed to Podiatry given chronicity and pain sxs. Keep padded to avoid further friction and pain. OTC pain relievers prn   Relevant Orders   Ambulatory referral to Podiatry       Follow up plan: Return if symptoms worsen or fail to improve.

## 2019-11-02 ENCOUNTER — Other Ambulatory Visit: Payer: Self-pay

## 2019-11-02 ENCOUNTER — Ambulatory Visit: Payer: Medicare HMO | Admitting: Podiatry

## 2019-11-02 ENCOUNTER — Ambulatory Visit (INDEPENDENT_AMBULATORY_CARE_PROVIDER_SITE_OTHER): Payer: Medicare HMO

## 2019-11-02 DIAGNOSIS — Q828 Other specified congenital malformations of skin: Secondary | ICD-10-CM

## 2019-11-02 DIAGNOSIS — M79674 Pain in right toe(s): Secondary | ICD-10-CM

## 2019-11-02 DIAGNOSIS — D510 Vitamin B12 deficiency anemia due to intrinsic factor deficiency: Secondary | ICD-10-CM | POA: Diagnosis not present

## 2019-11-02 NOTE — Progress Notes (Signed)
  Subjective:  Patient ID: Kayla Johnson, female    DOB: [birth year],  MRN: [id number] HPI Chief Complaint  Patient presents with  . New Patient (Initial Visit)    right 4th digit , hard skin that is tender to touch when rubbing up against 5th digit .     73 y.o. female presents with the above complaint.   ROS: Denies fever chills nausea vomiting muscle aches pains calf pain back pain chest pain shortness of breath.  Past Medical History:  Diagnosis Date  . Lichen sclerosus   . Pernicious anemia   . Shoulder bursitis    Past Surgical History:  Procedure Laterality Date  . Molar Pregnancy     S/P D & C  . TONSILLECTOMY    . Tubes & Ovaries removed  2015    Current Outpatient Medications:  .  Cyanocobalamin (VITAMIN B 12 PO), Take by mouth daily., Disp: , Rfl:  .  diclofenac sodium (VOLTAREN) 1 % GEL, Apply 2 g topically 4 (four) times daily as needed., Disp: 100 g, Rfl: 0 .  triamcinolone cream (KENALOG) 0.1 %, 2 (two) times daily as needed, Disp: 90 g, Rfl: 1 .  Vitamin D, Ergocalciferol, (DRISDOL) 1.25 MG (50000 UT) CAPS capsule, Take 1 capsule (50,000 Units total) by mouth every 7 (seven) days., Disp: 12 capsule, Rfl: 3  Current Facility-Administered Medications:  .  cyanocobalamin ((VITAMIN B-12)) injection 1,000 mcg, 1,000 mcg, Intramuscular, Q30 days, Kathrine Haddock, NP, 1,000 mcg at 07/16/17 1135 .  cyanocobalamin ((VITAMIN B-12)) injection 1,000 mcg, 1,000 mcg, Intramuscular, Q30 days, Kathrine Haddock, NP, 1,000 mcg at 11/18/17 1345  No Known Allergies Review of Systems Objective:  There were no vitals filed for this visit.  General: Well developed, nourished, in no acute distress, alert and oriented x3   Dermatological: Skin is warm, dry and supple bilateral. Nails x 10 are well maintained; remaining integument appears unremarkable at this time. There are no open sores, no preulcerative lesions, no rash or signs of infection present.  Due to the hammertoe deformities  #4 #5 in the right foot.  There is a reactive hyperkeratotic lesion to the lateral aspect of the fourth toe right foot.  No open lesions or wounds are noted.  Vascular: Dorsalis Pedis artery and Posterior Tibial artery pedal pulses are 2/4 bilateral with immedate capillary fill time. Pedal hair growth present. No varicosities and no lower extremity edema present bilateral.   Neruologic: Grossly intact via light touch bilateral. Vibratory intact via tuning fork bilateral. Protective threshold with Semmes Wienstein monofilament intact to all pedal sites bilateral. Patellar and Achilles deep tendon reflexes 2+ bilateral. No Babinski or clonus noted bilateral.   Musculoskeletal: No gross boney pedal deformities bilateral. No pain, crepitus, or limitation noted with foot and ankle range of motion bilateral. Muscular strength 5/5 in all groups tested bilateral.  Adductovarus rotated hammertoe deformities #4 #5 of the right foot.  Gait: Unassisted, Nonantalgic.    Radiographs:  Radiographs taken today demonstrate an osseously mature individual with an adductovarus rotated fourth and fifth toe of the right foot  Assessment & Plan:   Assessment: Hammertoe deformity with porokeratosis fourth right.  Plan: Debridement of reactive hyperkeratotic lesion.  Discussed surgical repair.  Placed silicone pads.  Follow-up as needed.     Kayla Johnson T. [place], [place]

## 2019-11-09 ENCOUNTER — Ambulatory Visit
Admission: RE | Admit: 2019-11-09 | Discharge: 2019-11-09 | Disposition: A | Discharge status: Home / Self Care | Payer: Medicare HMO | Source: Ambulatory Visit | Attending: Family Medicine | Admitting: Family Medicine

## 2019-11-09 ENCOUNTER — Encounter: Payer: Self-pay | Admitting: Family Medicine

## 2019-11-09 ENCOUNTER — Ambulatory Visit (INDEPENDENT_AMBULATORY_CARE_PROVIDER_SITE_OTHER): Payer: Medicare HMO | Admitting: Family Medicine

## 2019-11-09 ENCOUNTER — Other Ambulatory Visit: Payer: Self-pay

## 2019-11-09 VITALS — BP 126/79 | HR 91 | Temp 98.2°F | Ht 61.5 in | Wt 106.0 lb

## 2019-11-09 DIAGNOSIS — M545 Low back pain, unspecified: Secondary | ICD-10-CM

## 2019-11-09 DIAGNOSIS — R200 Anesthesia of skin: Secondary | ICD-10-CM

## 2019-11-09 DIAGNOSIS — G8929 Other chronic pain: Secondary | ICD-10-CM

## 2019-11-09 MED ORDER — GABAPENTIN 100 MG PO CAPS: 100.0000 mg | ORAL_CAPSULE | Freq: Three times a day (TID) | ORAL | 1 refills | Status: DC

## 2019-11-09 NOTE — Progress Notes (Signed)
BP 126/79   Pulse 91   Temp 98.2 F (36.8 C) (Oral)   Ht 5' 1.5" (1.562 m)   Wt 106 lb (48.1 kg)   SpO2 97%   BMI 19.70 kg/m    Subjective:    Patient ID: Kayla Johnson, female    DOB: 06-02-1946, 73 y.o.   MRN: [id number]  HPI: Kayla Johnson is a 73 y.o. female  Chief Complaint  Patient presents with  . Hand Pain    left hand fingers numbness and tingling  . Back Pain    lower x several years, gotten worse lately   First 3 fingers on left hand having numbness and burning pains. Has been present to a lesser extent previoulsy but the numbness is newer. Will come and go throughout the day intermittently without a noticeable pattern. Sxs will last typically about 10 minutes and then resolve spontaneously. The hand seems stiff often times too. Does have known arthritis in her neck and occasionally has these sxs in other hand as well. Denies weakness, color change, cool extremities, decreased ROM.   Left lower back pain that has been present for several years. States she thinks it's arthritis but is nervous about it and wanting imaging done. Denies radiation down legs, injury, hx of back issues, bowel or bladder incontinence, fevers. Using tylenol prn with some mild relief.   Relevant past medical, surgical, family and social history reviewed and updated as indicated. Interim medical history since our last visit reviewed. Allergies and medications reviewed and updated.  Review of Systems  Per HPI unless specifically indicated above     Objective:    BP 126/79   Pulse 91   Temp 98.2 F (36.8 C) (Oral)   Ht 5' 1.5" (1.562 m)   Wt 106 lb (48.1 kg)   SpO2 97%   BMI 19.70 kg/m   Wt Readings from Last 3 Encounters:  11/09/19 106 lb (48.1 kg)  10/12/19 108 lb (49 kg)  06/15/19 106 lb 12.8 oz (48.4 kg)    Physical Exam Vitals and nursing note reviewed.  Constitutional:      Appearance: Normal appearance. She is not ill-appearing.  HENT:     Head: Atraumatic.  Eyes:   Extraocular Movements: Extraocular movements intact.     Conjunctiva/sclera: Conjunctivae normal.  Cardiovascular:     Rate and Rhythm: Normal rate and regular rhythm.     Pulses: Normal pulses.     Heart sounds: Normal heart sounds.  Pulmonary:     Effort: Pulmonary effort is normal.     Breath sounds: Normal breath sounds.  Abdominal:     General: Bowel sounds are normal. There is no distension.     Palpations: Abdomen is soft.     Tenderness: There is no abdominal tenderness. There is no guarding.  Musculoskeletal:        General: No swelling, tenderness or deformity. Normal range of motion.     Cervical back: Normal range of motion and neck supple.  Skin:    General: Skin is warm and dry.  Neurological:     General: No focal deficit present.     Mental Status: She is alert and oriented to person, place, and time.     Sensory: No sensory deficit.     Motor: No weakness.  Psychiatric:        Mood and Affect: Mood normal.        Thought Content: Thought content normal.  Judgment: Judgment normal.     Results for orders placed or performed in visit on 09/23/19  Vitamin B12  Result Value Ref Range   Vitamin B-12 >2000 (H) 232 - 1245 pg/mL  VITAMIN D 25 Hydroxy (Vit-D Deficiency, Fractures)  Result Value Ref Range   Vit D, 25-Hydroxy 16.7 (L) 30.0 - 100.0 ng/mL  TSH  Result Value Ref Range   TSH 1.630 0.450 - 4.500 uIU/mL  CBC with Differential/Platelet  Result Value Ref Range   WBC 4.4 3.4 - 10.8 x10E3/uL   RBC 4.49 3.77 - 5.28 x10E6/uL   Hemoglobin 11.4 11.1 - 15.9 g/dL   Hematocrit 34.9 34.0 - 46.6 %   MCV 78 (L) 79 - 97 fL   MCH 25.4 (L) 26.6 - 33.0 pg   MCHC 32.7 31.5 - 35.7 g/dL   RDW 15.8 (H) 11.7 - 15.4 %   Platelets 354 150 - 450 x10E3/uL   Neutrophils 48 Not Estab. %   Lymphs 40 Not Estab. %   Monocytes 9 Not Estab. %   Eos 2 Not Estab. %   Basos 1 Not Estab. %   Neutrophils Absolute 2.2 1.4 - 7.0 x10E3/uL   Lymphocytes Absolute 1.7 0.7 - 3.1  x10E3/uL   Monocytes Absolute 0.4 0.1 - 0.9 x10E3/uL   EOS (ABSOLUTE) 0.1 0.0 - 0.4 x10E3/uL   Basophils Absolute 0.0 0.0 - 0.2 x10E3/uL   Immature Granulocytes 0 Not Estab. %   Immature Grans (Abs) 0.0 0.0 - 0.1 x10E3/uL      Assessment & Plan:   Problem List Items Addressed This Visit    None    Visit Diagnoses    Hand numbness    -  Primary   Suspect some radiculopathy from the neck. Will trial gabapentin, postural exercises. Declines imaging at this time or referral to specialist   Chronic left-sided low back pain without sciatica       Obtain x-ray, discussed lidocaine patches, possibly some PT, tylenol prn.    Relevant Orders   DG Lumbar Spine Complete (Completed)       Follow up plan: Return if symptoms worsen or fail to improve.

## 2019-11-09 NOTE — Patient Instructions (Signed)
Can get lidocaine patches for your back pain as needed  Can take the gabapentin up to three times a day as needed - it may make you sleepy so be careful with it  You can go to [facility] and get your x-ray done

## 2020-01-02 ENCOUNTER — Ambulatory Visit (INDEPENDENT_AMBULATORY_CARE_PROVIDER_SITE_OTHER): Payer: Medicare HMO | Admitting: Family Medicine

## 2020-01-02 ENCOUNTER — Other Ambulatory Visit: Payer: Self-pay

## 2020-01-02 ENCOUNTER — Encounter: Payer: Self-pay | Admitting: Family Medicine

## 2020-01-02 VITALS — BP 121/79 | HR 78 | Temp 98.5°F | Ht 61.5 in | Wt 109.0 lb

## 2020-01-02 DIAGNOSIS — M542 Cervicalgia: Secondary | ICD-10-CM

## 2020-01-02 DIAGNOSIS — Z1239 Encounter for other screening for malignant neoplasm of breast: Secondary | ICD-10-CM

## 2020-01-02 DIAGNOSIS — D510 Vitamin B12 deficiency anemia due to intrinsic factor deficiency: Secondary | ICD-10-CM | POA: Diagnosis not present

## 2020-01-02 DIAGNOSIS — E559 Vitamin D deficiency, unspecified: Secondary | ICD-10-CM

## 2020-01-02 DIAGNOSIS — R200 Anesthesia of skin: Secondary | ICD-10-CM

## 2020-01-02 MED ORDER — GABAPENTIN 300 MG PO CAPS: 300.0000 mg | ORAL_CAPSULE | Freq: Three times a day (TID) | ORAL | 0 refills | Status: DC | PRN

## 2020-01-02 NOTE — Assessment & Plan Note (Signed)
Recheck B12 given past deficiency and neuropathic pain

## 2020-01-02 NOTE — Patient Instructions (Signed)
Please call this number to schedule your mammogram. [phone] 

## 2020-01-02 NOTE — Progress Notes (Signed)
BP 121/79   Pulse 78   Temp 98.5 F (36.9 C) (Oral)   Ht 5' 1.5" (1.562 m)   Wt 109 lb (49.4 kg)   SpO2 97%   BMI 20.26 kg/m    Subjective:    Patient ID: Kayla Johnson, female    DOB: [birth year], 74 y.o.   MRN: [id number]  HPI: Kayla Johnson is a 74 y.o. female  Chief Complaint  Patient presents with  . Numbness    left hand   Patient presenting today with ongoing and progressive burning pain and numbness going down left arm into hand, now affecting nearly all of her fingers. Feeling like she's losing strength in hand/arm at this point. This seems to be associated with intermittent neck pain. Has tried PT and low dose gabapentin which the PT seemed to exacerbate it and the gabapentin didn't help. Denies color change, extremity edema, new injury, fevers, chills.   Relevant past medical, surgical, family and social history reviewed and updated as indicated. Interim medical history since our last visit reviewed. Allergies and medications reviewed and updated.  Review of Systems  Per HPI unless specifically indicated above     Objective:    BP 121/79   Pulse 78   Temp 98.5 F (36.9 C) (Oral)   Ht 5' 1.5" (1.562 m)   Wt 109 lb (49.4 kg)   SpO2 97%   BMI 20.26 kg/m   Wt Readings from Last 3 Encounters:  01/02/20 109 lb (49.4 kg)  11/09/19 106 lb (48.1 kg)  10/12/19 108 lb (49 kg)    Physical Exam Vitals and nursing note reviewed.  Constitutional:      Appearance: Normal appearance.  HENT:     Mouth/Throat:     Mouth: Mucous membranes are moist.  Eyes:     Extraocular Movements: Extraocular movements intact.     Conjunctiva/sclera: Conjunctivae normal.  Cardiovascular:     Rate and Rhythm: Normal rate.     Pulses: Normal pulses.     Heart sounds: Normal heart sounds.  Pulmonary:     Effort: Pulmonary effort is normal. No respiratory distress.     Breath sounds: Normal breath sounds.  Musculoskeletal:        General: No swelling or deformity.     Cervical  back: Normal range of motion and neck supple. No tenderness.     Comments: Mildly decreased grip strength left hand Negative tinel's b/l wrists  Neurological:     Motor: Weakness present.     Comments: Sensation difference in left hand vs right hand to light touch  Psychiatric:        Mood and Affect: Mood normal.        Behavior: Behavior normal.        Thought Content: Thought content normal.        Judgment: Judgment normal.     Results for orders placed or performed in visit on 09/23/19  Vitamin B12  Result Value Ref Range   Vitamin B-12 >2000 (H) 232 - 1245 pg/mL  VITAMIN D 25 Hydroxy (Vit-D Deficiency, Fractures)  Result Value Ref Range   Vit D, 25-Hydroxy 16.7 (L) 30.0 - 100.0 ng/mL  TSH  Result Value Ref Range   TSH 1.630 0.450 - 4.500 uIU/mL  CBC with Differential/Platelet  Result Value Ref Range   WBC 4.4 3.4 - 10.8 x10E3/uL   RBC 4.49 3.77 - 5.28 x10E6/uL   Hemoglobin 11.4 11.1 - 15.9 g/dL   Hematocrit 34.9 34.0 -  46.6 %   MCV 78 (L) 79 - 97 fL   MCH 25.4 (L) 26.6 - 33.0 pg   MCHC 32.7 31.5 - 35.7 g/dL   RDW 15.8 (H) 11.7 - 15.4 %   Platelets 354 150 - 450 x10E3/uL   Neutrophils 48 Not Estab. %   Lymphs 40 Not Estab. %   Monocytes 9 Not Estab. %   Eos 2 Not Estab. %   Basos 1 Not Estab. %   Neutrophils Absolute 2.2 1.4 - 7.0 x10E3/uL   Lymphocytes Absolute 1.7 0.7 - 3.1 x10E3/uL   Monocytes Absolute 0.4 0.1 - 0.9 x10E3/uL   EOS (ABSOLUTE) 0.1 0.0 - 0.4 x10E3/uL   Basophils Absolute 0.0 0.0 - 0.2 x10E3/uL   Immature Granulocytes 0 Not Estab. %   Immature Grans (Abs) 0.0 0.0 - 0.1 x10E3/uL      Assessment & Plan:   Problem List Items Addressed This Visit      Other   Pernicious anemia    Recheck B12 given past deficiency and neuropathic pain      Relevant Orders   Vitamin B12    Other Visit Diagnoses    Hand numbness    -  Primary   Worsening and now with weakness. Referral to Neurosurgery placed, increase gabapentin. 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 Neurosurgery referral placed for further eval   Relevant Orders   Ambulatory referral to Neurosurgery   Encounter for screening for malignant neoplasm of breast, unspecified screening modality       Relevant Orders   MM DIGITAL SCREENING BILATERAL       Follow up plan: Return if symptoms worsen or fail to improve.

## 2020-01-03 LAB — VITAMIN B12: Vitamin B-12: >2000 pg/mL — ABNORMAL HIGH (ref 232–1245)

## 2020-01-03 LAB — VITAMIN D 25 HYDROXY (VIT D DEFICIENCY, FRACTURES): Vit D, 25-Hydroxy: 50.1 ng/mL (ref 30.0–100.0)

## 2020-01-17 DIAGNOSIS — R200 Anesthesia of skin: Secondary | ICD-10-CM | POA: Diagnosis not present

## 2020-01-17 DIAGNOSIS — R202 Paresthesia of skin: Secondary | ICD-10-CM | POA: Diagnosis not present

## 2020-01-24 DIAGNOSIS — M542 Cervicalgia: Secondary | ICD-10-CM | POA: Diagnosis not present

## 2020-01-24 DIAGNOSIS — M79642 Pain in left hand: Secondary | ICD-10-CM | POA: Diagnosis not present

## 2020-01-27 ENCOUNTER — Other Ambulatory Visit: Payer: Self-pay

## 2020-01-27 ENCOUNTER — Emergency Department
Admission: EM | Admit: 2020-01-27 | Discharge: 2020-01-28 | Disposition: A | Discharge status: Home / Self Care | Payer: Medicare HMO | Attending: Emergency Medicine | Admitting: Emergency Medicine

## 2020-01-27 DIAGNOSIS — Z79899 Other long term (current) drug therapy: Secondary | ICD-10-CM | POA: Diagnosis not present

## 2020-01-27 DIAGNOSIS — K8020 Calculus of gallbladder without cholecystitis without obstruction: Secondary | ICD-10-CM

## 2020-01-27 DIAGNOSIS — R1084 Generalized abdominal pain: Secondary | ICD-10-CM | POA: Diagnosis not present

## 2020-01-27 DIAGNOSIS — R112 Nausea with vomiting, unspecified: Secondary | ICD-10-CM | POA: Insufficient documentation

## 2020-01-27 DIAGNOSIS — R1110 Vomiting, unspecified: Secondary | ICD-10-CM | POA: Diagnosis not present

## 2020-01-27 DIAGNOSIS — R9431 Abnormal electrocardiogram [ECG] [EKG]: Secondary | ICD-10-CM | POA: Diagnosis not present

## 2020-01-27 LAB — URINALYSIS, COMPLETE (UACMP) WITH MICROSCOPIC
Bacteria, UA: NONE SEEN
Bilirubin Urine: NEGATIVE
Glucose, UA: NEGATIVE mg/dL
Hgb urine dipstick: NEGATIVE
Ketones, ur: 5 mg/dL — AB
Leukocytes,Ua: NEGATIVE
Nitrite: NEGATIVE
Protein, ur: NEGATIVE mg/dL
Specific Gravity, Urine: 1.016 (ref 1.005–1.030)
pH: 8 (ref 5.0–8.0)

## 2020-01-27 LAB — CBC
HCT: 35.1 % — ABNORMAL LOW (ref 36.0–46.0)
Hemoglobin: 10.9 g/dL — ABNORMAL LOW (ref 12.0–15.0)
MCH: 25.5 pg — ABNORMAL LOW (ref 26.0–34.0)
MCHC: 31.1 g/dL (ref 30.0–36.0)
MCV: 82 fL (ref 80.0–100.0)
Platelets: 216 10*3/uL (ref 150–400)
RBC: 4.28 MIL/uL (ref 3.87–5.11)
RDW: 17 % — ABNORMAL HIGH (ref 11.5–15.5)
WBC: 8 10*3/uL (ref 4.0–10.5)
nRBC: 0 % (ref 0.0–0.2)

## 2020-01-27 LAB — COMPREHENSIVE METABOLIC PANEL
ALT: 13 U/L (ref 0–44)
AST: 17 U/L (ref 15–41)
Albumin: 3.8 g/dL (ref 3.5–5.0)
Alkaline Phosphatase: 81 U/L (ref 38–126)
Anion gap: 10 (ref 5–15)
BUN: 16 mg/dL (ref 8–23)
CO2: 24 mmol/L (ref 22–32)
Calcium: 8.4 mg/dL — ABNORMAL LOW (ref 8.9–10.3)
Chloride: 104 mmol/L (ref 98–111)
Creatinine, Ser: 0.57 mg/dL (ref 0.44–1.00)
GFR calc Af Amer: >60 mL/min (ref >60)
GFR calc non Af Amer: >60 mL/min (ref >60)
Glucose, Bld: 108 mg/dL — ABNORMAL HIGH (ref 70–99)
Potassium: 3.7 mmol/L (ref 3.5–5.1)
Sodium: 138 mmol/L (ref 135–145)
Total Bilirubin: 0.8 mg/dL (ref 0.3–1.2)
Total Protein: 6.4 g/dL — ABNORMAL LOW (ref 6.5–8.1)

## 2020-01-27 LAB — LIPASE, BLOOD: Lipase: 40 U/L (ref 11–51)

## 2020-01-27 MED ORDER — FENTANYL CITRATE (PF) 100 MCG/2ML IJ SOLN
50.0000 ug | Freq: Once | INTRAMUSCULAR | Status: AC
  Administered 2020-01-27: 50 ug via INTRAVENOUS
  Filled 2020-01-27: qty 2

## 2020-01-27 MED ORDER — ONDANSETRON 4 MG PO TBDP
4.0000 mg | ORAL_TABLET | Freq: Once | ORAL | Status: AC | PRN
  Administered 2020-01-27: 4 mg via ORAL
  Filled 2020-01-27 (×2): qty 1

## 2020-01-27 MED ORDER — ONDANSETRON HCL 4 MG/2ML IJ SOLN
4.0000 mg | Freq: Once | INTRAMUSCULAR | Status: AC
  Administered 2020-01-27: 4 mg via INTRAVENOUS
  Filled 2020-01-27: qty 2

## 2020-01-27 MED ORDER — IOHEXOL 9 MG/ML PO SOLN
500.0000 mL | ORAL | Status: AC
  Administered 2020-01-27: 500 mL via ORAL

## 2020-01-27 MED ORDER — SODIUM CHLORIDE 0.9 % IV BOLUS
1000.0000 mL | Freq: Once | INTRAVENOUS | Status: AC
  Administered 2020-01-27: 1000 mL via INTRAVENOUS

## 2020-01-27 NOTE — ED Provider Notes (Signed)
[facility] Emergency Department Provider Note   ____________________________________________   First MD Initiated Contact with Patient 01/27/20 2313     (approximate)  I have reviewed the triage vital signs and the nursing notes.   HISTORY  Chief Complaint Abdominal Pain    HPI Kayla Johnson is a 74 y.o. female who presents to the ED from home with a chief complaint of abdominal pain.  Reports onset of generalized abdominal pain associated with nausea and vomiting this evening.  Also had a normal bowel movement which patient states is unusual because she does not stool in the evening time.  Denies associated fever, chills, cough, chest pain, shortness of breath, dysuria.  Denies recent travel or trauma.       Past Medical History:  Diagnosis Date  . Lichen sclerosus   . Pernicious anemia   . Shoulder bursitis     Patient Active Problem List   Diagnosis Date Noted  . Advanced care planning/counseling discussion 04/14/2018  . Shoulder impingement syndrome, right 03/03/2018  . Urticaria 03/03/2018  . Skin lesion of right leg 03/03/2018  . Anemia 01/01/2017  . Fatigue 12/31/2016  . Lichen sclerosus of female genitalia 12/31/2016  . Bilateral tinnitus 12/12/2015  . Allergic rhinitis 12/12/2015  . Pernicious anemia 10/22/2015    Past Surgical History:  Procedure Laterality Date  . Molar Pregnancy     S/P D & C  . TONSILLECTOMY    . Tubes & Ovaries removed  2015    Prior to Admission medications   Medication Sig Start Date End Date Taking? Authorizing Provider  Cyanocobalamin (VITAMIN B 12 PO) Take by mouth daily.    [provider]  gabapentin (NEURONTIN) 300 MG capsule Take 1 capsule (300 mg total) by mouth 3 (three) times daily as needed. 01/02/20   Volney American, PA-C  triamcinolone cream (KENALOG) 0.1 % 2 (two) times daily as needed 06/15/19   Volney American, PA-C  Vitamin D, Ergocalciferol, (DRISDOL) 1.25 MG  (50000 UT) CAPS capsule Take 1 capsule (50,000 Units total) by mouth every 7 (seven) days. 09/28/19   Volney American, PA-C    Allergies Patient has no known allergies.  Family History  Problem Relation Age of Onset  . Arthritis Mother   . Diabetes Mother   . Arthritis Father   . Diabetes Sister   . Stroke Sister   . Diabetes Sister   . Ovarian cancer Sister     Social History Social History   Tobacco Use  . Smoking status: Never Smoker  . Smokeless tobacco: Never Used  Substance Use Topics  . Alcohol use: No    Alcohol/week: 0.0 standard drinks  . Drug use: No    Review of Systems  Constitutional: No fever/chills Eyes: No visual changes. ENT: No sore throat. Cardiovascular: Denies chest pain. Respiratory: Denies shortness of breath. Gastrointestinal: Positive for abdominal pain, nausea and vomiting.  No diarrhea.  No constipation. Genitourinary: Negative for dysuria. Musculoskeletal: Negative for back pain. Skin: Negative for rash. Neurological: Negative for headaches, focal weakness or numbness.   ____________________________________________   PHYSICAL EXAM:  VITAL SIGNS: ED Triage Vitals [01/27/20 2130]  Enc Vitals Group     BP 137/74     Pulse Rate 92     Resp 18     Temp 98.3 F (36.8 C)     Temp Source Oral     SpO2 98 %     Weight 108 lb (49 kg)  Height 5\' 5"  (1.651 m)     Head Circumference      Peak Flow      Pain Score 4     Pain Loc      Pain Edu?      Excl. in [place]?     Constitutional: Alert and oriented. Well appearing and in no acute distress. Eyes: Conjunctivae are normal. PERRL. EOMI. Head: Atraumatic. Nose: No congestion/rhinnorhea. Mouth/Throat: Mucous membranes are moist.  Oropharynx non-erythematous. Neck: No stridor.   Cardiovascular: Normal rate, regular rhythm. Grossly normal heart sounds.  Good peripheral circulation. Respiratory: Normal respiratory effort.  No retractions. Lungs CTAB. Gastrointestinal: Soft  with mild diffuse tenderness to palpation without rebound or guarding. No distention. No abdominal bruits. No CVA tenderness. Musculoskeletal: No lower extremity tenderness nor edema.  No joint effusions. Neurologic:  Normal speech and language. No gross focal neurologic deficits are appreciated. No gait instability. Skin:  Skin is warm, dry and intact. No rash noted. Psychiatric: Mood and affect are normal. Speech and behavior are normal.  ____________________________________________   LABS (all labs ordered are listed, but only abnormal results are displayed)  Labs Reviewed  COMPREHENSIVE METABOLIC PANEL - Abnormal; Notable for the following components:      Result Value   Glucose, Bld 108 (*)    Calcium 8.4 (*)    Total Protein 6.4 (*)    All other components within normal limits  CBC - Abnormal; Notable for the following components:   Hemoglobin 10.9 (*)    HCT 35.1 (*)    MCH 25.5 (*)    RDW 17.0 (*)    All other components within normal limits  URINALYSIS, COMPLETE (UACMP) WITH MICROSCOPIC - Abnormal; Notable for the following components:   Color, Urine YELLOW (*)    APPearance CLEAR (*)    Ketones, ur 5 (*)    All other components within normal limits  LIPASE, BLOOD   ____________________________________________  EKG  ED ECG REPORT I, Bryen Hinderman J, the attending physician, personally viewed and interpreted this ECG.   Date: 01/27/2020  EKG Time: 2135  Rate: 88  Rhythm: normal EKG, normal sinus rhythm  Axis: Normal  Intervals:none  ST&T Change: Nonspecific  ____________________________________________  RADIOLOGY  ED MD interpretation: Cholelithiasis, unchanged periportal edema, no other acute intra-abdominal pathology  Official radiology report(s): CT Abdomen Pelvis W Contrast  Result Date: 01/28/2020 CLINICAL DATA:  Nausea vomiting EXAM: CT ABDOMEN AND PELVIS WITH CONTRAST TECHNIQUE: Multidetector CT imaging of the abdomen and pelvis was performed using  the standard protocol following bolus administration of intravenous contrast. CONTRAST:  163mL OMNIPAQUE IOHEXOL 300 MG/ML  SOLN COMPARISON:  February 13, 2017 FINDINGS: Lower chest: The visualized heart size within normal limits. No pericardial fluid/thickening. No hiatal hernia. The visualized portions of the lungs are clear. Hepatobiliary: There is mild periportal edema. Within the anterior left liver lobe there is a 1 cm low-density lesion, unchanged from prior exam.The main portal vein is patent. Multiple calcified gallstones are present. No extrahepatic biliary ductal dilatation is noted. Pancreas: Unremarkable. No pancreatic ductal dilatation or surrounding inflammatory changes. Spleen: Normal in size without focal abnormality. Adrenals/Urinary Tract: Both adrenal glands appear normal. The kidneys and collecting system appear normal without evidence of urinary tract calculus or hydronephrosis. Bladder is unremarkable. Stomach/Bowel: The stomach, small bowel, and colon are normal in appearance. No inflammatory changes, Demilio thickening, or obstructive findings.The appendix is normal. Vascular/Lymphatic: There are no enlarged mesenteric, retroperitoneal, or pelvic lymph nodes. Scattered aortic atherosclerotic calcifications are seen without  aneurysmal dilatation. Reproductive: The uterus and adnexa are unremarkable. Other: No evidence of abdominal [place] mass or hernia. Musculoskeletal: No acute or significant osseous findings. IMPRESSION: Unchanged mild periportal edema which is nonspecific, could be due to inflammatory process or fluid resuscitation. Cholelithiasis No other acute intra-abdominal or pelvic pathology to explain the patient's symptoms. Diverticulosis without diverticulitis. Aortic Atherosclerosis (ICD10-I70.0). Electronically Signed   By: Prudencio Pair M.D.   On: 01/28/2020 02:25    ____________________________________________   PROCEDURES  Procedure(s) performed (including Critical  Care):  Procedures   ____________________________________________   INITIAL IMPRESSION / ASSESSMENT AND PLAN / ED COURSE  As part of my medical decision making, I reviewed the following data within the [facility] notes reviewed and incorporated, Labs reviewed, EKG interpreted, Old chart reviewed, Radiograph reviewed and Notes from prior ED visits     Kayla Johnson was evaluated in Emergency Department on 01/28/2020 for the symptoms described in the history of present illness. She was evaluated in the context of the global COVID-19 pandemic, which necessitated consideration that the patient might be at risk for infection with the SARS-CoV-2 virus that causes COVID-19. Institutional protocols and algorithms that pertain to the evaluation of patients at risk for COVID-19 are in a state of rapid change based on information released by regulatory bodies including the CDC and federal and state organizations. These policies and algorithms were followed during the patient's care in the ED.    74 year old female who presents with abdominal pain, nausea and vomiting. Differential diagnosis includes, but is not limited to, ovarian cyst, ovarian torsion, acute appendicitis, diverticulitis, urinary tract infection/pyelonephritis, endometriosis, bowel obstruction, colitis, renal colic, gastroenteritis, hernia, fibroids, endometriosis, etc.  Laboratory and urinalysis results unremarkable.  Will initiate IV fluid resuscitation, analgesia/antiemetic and proceed with CT abdomen/pelvis to evaluate for intra-abdominal etiology of patient's symptoms.   Clinical Course as of Jan 28 244  Sat Jan 28, 2020  0242 Updated patient CT result.  Tolerated oral contrast without emesis.  Will discharge home with prescriptions for Zofran to use as needed.  Strict return precautions given.  Patient verbalizes understanding and agrees with plan of care.   [JS]    Clinical Course User Index [JS] Paulette Blanch, MD     ____________________________________________   FINAL CLINICAL IMPRESSION(S) / ED DIAGNOSES  Final diagnoses:  Generalized abdominal pain  Non-intractable vomiting with nausea, unspecified vomiting type  Calculus of gallbladder without cholecystitis without obstruction     ED Discharge Orders    None       Note:  This document was prepared using Dragon voice recognition software and may include unintentional dictation errors.   Paulette Blanch, MD 01/28/20 [phone]

## 2020-01-27 NOTE — ED Triage Notes (Signed)
Patient c/o mid abdominal pain and N/V beginning this evening.

## 2020-01-28 ENCOUNTER — Emergency Department: Payer: Medicare HMO

## 2020-01-28 DIAGNOSIS — R1110 Vomiting, unspecified: Secondary | ICD-10-CM | POA: Diagnosis not present

## 2020-01-28 MED ORDER — ONDANSETRON 4 MG PO TBDP: 4.0000 mg | ORAL_TABLET | Freq: Three times a day (TID) | ORAL | 0 refills | Status: DC | PRN

## 2020-01-28 MED ORDER — IOHEXOL 300 MG/ML  SOLN
100.0000 mL | Freq: Once | INTRAMUSCULAR | Status: AC | PRN
  Administered 2020-01-28: 100 mL via INTRAVENOUS

## 2020-01-28 NOTE — ED Notes (Signed)
Called CT to let them know pt finished contrast

## 2020-01-28 NOTE — Discharge Instructions (Addendum)
1.  You may take Zofran as needed for nausea/vomiting. 2.  Clear liquids x12 hours, then bland diet x2 days, then advance diet as tolerated. 3.  Return to the ER for worsening symptoms, persistent vomiting, difficulty breathing or other concerns.

## 2020-02-08 ENCOUNTER — Ambulatory Visit: Payer: Medicare HMO | Admitting: Surgery

## 2020-02-15 ENCOUNTER — Ambulatory Visit: Payer: Medicare HMO | Admitting: Podiatry

## 2020-02-15 ENCOUNTER — Other Ambulatory Visit: Payer: Self-pay

## 2020-02-15 ENCOUNTER — Encounter: Payer: Self-pay | Admitting: Podiatry

## 2020-02-15 VITALS — Temp 97.6°F

## 2020-02-15 DIAGNOSIS — Q828 Other specified congenital malformations of skin: Secondary | ICD-10-CM

## 2020-02-15 NOTE — Progress Notes (Signed)
She presents today chief complaint of painful area to the plantar aspect of her left foot.  She says a small callused areas are very tender and painful.  She would like to have them removed.  Objective: Vital signs are stable she is alert oriented x3 3 solitary porokeratotic lesions to the plantar aspect of the left foot beneath the second and third metatarsal neck areas bilaterally.  There is no open lesions or wounds.  Assessment: Poor keratomas x3 plantar aspect left foot.  Plan: Enucleation of these lesions today.  Follow-up with me on an as-needed basis.

## 2020-03-14 DIAGNOSIS — M25532 Pain in left wrist: Secondary | ICD-10-CM | POA: Diagnosis not present

## 2020-03-14 DIAGNOSIS — R200 Anesthesia of skin: Secondary | ICD-10-CM | POA: Diagnosis not present

## 2020-03-14 DIAGNOSIS — R202 Paresthesia of skin: Secondary | ICD-10-CM | POA: Diagnosis not present

## 2020-05-02 ENCOUNTER — Ambulatory Visit: Payer: Medicare HMO | Admitting: Family Medicine

## 2020-05-02 ENCOUNTER — Other Ambulatory Visit: Payer: Self-pay

## 2020-05-02 ENCOUNTER — Encounter: Payer: Self-pay | Admitting: Family Medicine

## 2020-05-02 ENCOUNTER — Ambulatory Visit (INDEPENDENT_AMBULATORY_CARE_PROVIDER_SITE_OTHER): Payer: Medicare HMO | Admitting: Family Medicine

## 2020-05-02 VITALS — BP 114/59 | HR 74 | Temp 98.7°F | Wt 104.0 lb

## 2020-05-02 DIAGNOSIS — M7541 Impingement syndrome of right shoulder: Secondary | ICD-10-CM | POA: Diagnosis not present

## 2020-05-02 MED ORDER — VITAMIN D (ERGOCALCIFEROL) 1.25 MG (50000 UNIT) PO CAPS: 50000.0000 [IU] | ORAL_CAPSULE | ORAL | 3 refills | Status: DC

## 2020-05-02 NOTE — Progress Notes (Signed)
BP (!) 114/59   Pulse 74   Temp 98.7 F (37.1 C) (Oral)   Wt 104 lb (47.2 kg)   SpO2 97%   BMI 17.31 kg/m    Subjective:    Patient ID: Kayla Johnson, female    DOB: [birth year], 74 y.o.   MRN: [id number]  HPI: ALACIA Johnson is a 74 y.o. female  Chief Complaint  Patient presents with  . Shoulder Pain    rigth front x a month   Acute intermittent episodes of right anterior shoulder pain that last a few seconds at a time when moving certain ways. This has been ongoing to a lesser extent for years but lately becoming more frequent. One episode of radiation down arm with some numbness and tingling in left hand but otherwise seems pretty localized though will occasionally travel toward underarm. Has not tried anything for sxs. No known injury, redness, swelling.   Relevant past medical, surgical, family and social history reviewed and updated as indicated. Interim medical history since our last visit reviewed. Allergies and medications reviewed and updated.  Review of Systems  Per HPI unless specifically indicated above     Objective:    BP (!) 114/59   Pulse 74   Temp 98.7 F (37.1 C) (Oral)   Wt 104 lb (47.2 kg)   SpO2 97%   BMI 17.31 kg/m   Wt Readings from Last 3 Encounters:  05/02/20 104 lb (47.2 kg)  01/27/20 108 lb (49 kg)  01/02/20 109 lb (49.4 kg)    Physical Exam Vitals and nursing note reviewed.  Constitutional:      Appearance: Normal appearance. She is not ill-appearing.  HENT:     Head: Atraumatic.  Eyes:     Extraocular Movements: Extraocular movements intact.     Conjunctiva/sclera: Conjunctivae normal.  Cardiovascular:     Rate and Rhythm: Normal rate and regular rhythm.     Heart sounds: Normal heart sounds.  Pulmonary:     Effort: Pulmonary effort is normal.     Breath sounds: Normal breath sounds.  Musculoskeletal:        General: No swelling, tenderness or deformity. Normal range of motion.     Cervical back: Normal range of motion and  neck supple.  Skin:    General: Skin is warm and dry.  Neurological:     Mental Status: She is alert and oriented to person, place, and time.     Motor: No weakness.  Psychiatric:        Mood and Affect: Mood normal.        Thought Content: Thought content normal.        Judgment: Judgment normal.     Results for orders placed or performed during the hospital encounter of 01/27/20  Lipase, blood  Result Value Ref Range   Lipase 40 11 - 51 U/L  Comprehensive metabolic panel  Result Value Ref Range   Sodium 138 135 - 145 mmol/L   Potassium 3.7 3.5 - 5.1 mmol/L   Chloride 104 98 - 111 mmol/L   CO2 24 22 - 32 mmol/L   Glucose, Bld 108 (H) 70 - 99 mg/dL   BUN 16 8 - 23 mg/dL   Creatinine, Ser 0.57 0.44 - 1.00 mg/dL   Calcium 8.4 (L) 8.9 - 10.3 mg/dL   Total Protein 6.4 (L) 6.5 - 8.1 g/dL   Albumin 3.8 3.5 - 5.0 g/dL   AST 17 15 - 41 U/L   ALT 13  0 - 44 U/L   Alkaline Phosphatase 81 38 - 126 U/L   Total Bilirubin 0.8 0.3 - 1.2 mg/dL   GFR calc non Af Amer >60 >60 mL/min   GFR calc Af Amer >60 >60 mL/min   Anion gap 10 5 - 15  CBC  Result Value Ref Range   WBC 8.0 4.0 - 10.5 K/uL   RBC 4.28 3.87 - 5.11 MIL/uL   Hemoglobin 10.9 (L) 12.0 - 15.0 g/dL   HCT 35.1 (L) 36.0 - 46.0 %   MCV 82.0 80.0 - 100.0 fL   MCH 25.5 (L) 26.0 - 34.0 pg   MCHC 31.1 30.0 - 36.0 g/dL   RDW 17.0 (H) 11.5 - 15.5 %   Platelets 216 150 - 400 K/uL   nRBC 0.0 0.0 - 0.2 %  Urinalysis, Complete w Microscopic  Result Value Ref Range   Color, Urine YELLOW (A) YELLOW   APPearance CLEAR (A) CLEAR   Specific Gravity, Urine 1.016 1.005 - 1.030   pH 8.0 5.0 - 8.0   Glucose, UA NEGATIVE NEGATIVE mg/dL   Hgb urine dipstick NEGATIVE NEGATIVE   Bilirubin Urine NEGATIVE NEGATIVE   Ketones, ur 5 (A) NEGATIVE mg/dL   Protein, ur NEGATIVE NEGATIVE mg/dL   Nitrite NEGATIVE NEGATIVE   Leukocytes,Ua NEGATIVE NEGATIVE   RBC / HPF 0-5 0 - 5 RBC/hpf   WBC, UA 0-5 0 - 5 WBC/hpf   Bacteria, UA NONE SEEN NONE SEEN     Squamous Epithelial / LPF 0-5 0 - 5      Assessment & Plan:   Problem List Items Addressed This Visit      Other   Shoulder impingement syndrome, right - Primary    Becoming more frequently painful, but still only a few seconds maybe once a week. Discussed supportive care with postural exercises, stretches. Will refer to orthopedics if worsening further          Follow up plan: Return if symptoms worsen or fail to improve.

## 2020-05-02 NOTE — Assessment & Plan Note (Signed)
Becoming more frequently painful, but still only a few seconds maybe once a week. Discussed supportive care with postural exercises, stretches. Will refer to orthopedics if worsening further

## 2020-05-09 ENCOUNTER — Ambulatory Visit: Payer: Medicare HMO | Admitting: Podiatry

## 2020-05-09 ENCOUNTER — Encounter: Payer: Self-pay | Admitting: Podiatry

## 2020-05-09 ENCOUNTER — Other Ambulatory Visit: Payer: Self-pay

## 2020-05-09 DIAGNOSIS — Q828 Other specified congenital malformations of skin: Secondary | ICD-10-CM | POA: Diagnosis not present

## 2020-05-09 NOTE — Progress Notes (Signed)
She presents today for follow-up of 5 painful lesion porokeratosis plantar aspect forefoot left.  States I can feel a hard spot on the bottom.  Objective: Vital signs are stable she is alert oriented x3.  Pulses are palpable.  Solitary porokeratotic lesion beneath the third metatarsal head of the left foot.  Enucleated today.  Assessment: Porokeratosis subthird met head left.  Plan: Enucleated the lesion today placed salicylic acid under occlusion for the next 3 days to be washed off thoroughly and I will follow-up with her in about 6 weeks

## 2020-05-22 ENCOUNTER — Telehealth: Payer: Self-pay | Admitting: Family Medicine

## 2020-05-22 NOTE — Telephone Encounter (Signed)
Copied from [place] [phone]. Topic: Medicare AWV >> May 22, 2020  2:51 PM Cher Nakai R wrote: Reason for CRM: Called patient to schedule Medicare Annual Wellness Visit with Nurse Health Advisor with [employer]  If patient returns call, please schedule for any date.  Last AWV completed: 04/14/2018  Appointment length should be 45 minutes  Thank you,  Colletta [place] [phone]

## 2020-05-23 ENCOUNTER — Ambulatory Visit: Payer: Medicare HMO | Admitting: Podiatry

## 2020-05-24 ENCOUNTER — Other Ambulatory Visit: Payer: Self-pay

## 2020-05-24 ENCOUNTER — Telehealth: Payer: Self-pay | Admitting: Family Medicine

## 2020-05-24 ENCOUNTER — Encounter: Payer: Self-pay | Admitting: Emergency Medicine

## 2020-05-24 ENCOUNTER — Emergency Department
Admission: EM | Admit: 2020-05-24 | Discharge: 2020-05-24 | Disposition: A | Discharge status: Home / Self Care | Payer: Medicare HMO | Attending: Emergency Medicine | Admitting: Emergency Medicine

## 2020-05-24 ENCOUNTER — Other Ambulatory Visit: Payer: Self-pay | Admitting: Family Medicine

## 2020-05-24 ENCOUNTER — Ambulatory Visit: Payer: Self-pay | Admitting: *Deleted

## 2020-05-24 DIAGNOSIS — R110 Nausea: Secondary | ICD-10-CM | POA: Diagnosis not present

## 2020-05-24 DIAGNOSIS — Z5321 Procedure and treatment not carried out due to patient leaving prior to being seen by health care provider: Secondary | ICD-10-CM | POA: Diagnosis not present

## 2020-05-24 DIAGNOSIS — R197 Diarrhea, unspecified: Secondary | ICD-10-CM | POA: Diagnosis present

## 2020-05-24 LAB — URINALYSIS, COMPLETE (UACMP) WITH MICROSCOPIC
Bacteria, UA: NONE SEEN
Bilirubin Urine: NEGATIVE
Glucose, UA: NEGATIVE mg/dL
Hgb urine dipstick: NEGATIVE
Ketones, ur: NEGATIVE mg/dL
Leukocytes,Ua: NEGATIVE
Nitrite: NEGATIVE
Protein, ur: NEGATIVE mg/dL
Specific Gravity, Urine: 1.017 (ref 1.005–1.030)
pH: 5 (ref 5.0–8.0)

## 2020-05-24 LAB — COMPREHENSIVE METABOLIC PANEL
ALT: 12 U/L (ref 0–44)
AST: 17 U/L (ref 15–41)
Albumin: 4 g/dL (ref 3.5–5.0)
Alkaline Phosphatase: 70 U/L (ref 38–126)
Anion gap: 8 (ref 5–15)
BUN: 15 mg/dL (ref 8–23)
CO2: 29 mmol/L (ref 22–32)
Calcium: 8.9 mg/dL (ref 8.9–10.3)
Chloride: 104 mmol/L (ref 98–111)
Creatinine, Ser: 0.7 mg/dL (ref 0.44–1.00)
GFR calc Af Amer: >60 mL/min (ref >60)
GFR calc non Af Amer: >60 mL/min (ref >60)
Glucose, Bld: 119 mg/dL — ABNORMAL HIGH (ref 70–99)
Potassium: 4.3 mmol/L (ref 3.5–5.1)
Sodium: 141 mmol/L (ref 135–145)
Total Bilirubin: 0.6 mg/dL (ref 0.3–1.2)
Total Protein: 6.6 g/dL (ref 6.5–8.1)

## 2020-05-24 LAB — CBC
HCT: 37.6 % (ref 36.0–46.0)
Hemoglobin: 12.4 g/dL (ref 12.0–15.0)
MCH: 26.2 pg (ref 26.0–34.0)
MCHC: 33 g/dL (ref 30.0–36.0)
MCV: 79.3 fL — ABNORMAL LOW (ref 80.0–100.0)
Platelets: 345 10*3/uL (ref 150–400)
RBC: 4.74 MIL/uL (ref 3.87–5.11)
RDW: 16.4 % — ABNORMAL HIGH (ref 11.5–15.5)
WBC: 11.8 10*3/uL — ABNORMAL HIGH (ref 4.0–10.5)
nRBC: 0 % (ref 0.0–0.2)

## 2020-05-24 LAB — LIPASE, BLOOD: Lipase: 34 U/L (ref 11–51)

## 2020-05-24 MED ORDER — SODIUM CHLORIDE 0.9% FLUSH: 3.0000 mL | Freq: Once | INTRAVENOUS | Status: DC

## 2020-05-24 MED ORDER — ONDANSETRON 4 MG PO TBDP: 4.0000 mg | ORAL_TABLET | Freq: Three times a day (TID) | ORAL | 0 refills | Status: DC | PRN

## 2020-05-24 NOTE — Telephone Encounter (Signed)
PT need a refill ondansetron (ZOFRAN-ODT) disintegrating tablet 4 mg  [[id number]]  [facility] [address]  [place]) [place] [address]  Phone: [phone] Fax: [phone]

## 2020-05-24 NOTE — Telephone Encounter (Signed)
Rx sent 

## 2020-05-24 NOTE — Telephone Encounter (Signed)
Pt called in prescription which was approved by Merrie Roof but transmission failed to pharmacy . Please advise

## 2020-05-24 NOTE — ED Triage Notes (Signed)
Pt to ED via POV c/o diarrhea and nausea. Pt states that she has felt nauseated for the past few days but started having diarrhea today. Pt reports that her called her MD who told her to come to the ED for possible dehydration. Pt is in NAD.

## 2020-05-24 NOTE — Addendum Note (Signed)
Addended by: Merrie Roof E on: 05/24/2020 01:35 PM   Modules accepted: Orders

## 2020-05-24 NOTE — Addendum Note (Signed)
Addended by: Matilde Sprang on: 05/24/2020 01:48 PM   Modules accepted: Orders

## 2020-05-24 NOTE — Telephone Encounter (Signed)
Medication called into pharmacy.  Patient notified.

## 2020-05-24 NOTE — Telephone Encounter (Signed)
Refill request for Ondansetron;  list: no  Requested medication (s) are due for refill today: no  Requested medication (s) are on the active medication list: no  Last refill: ?  Future visit scheduled: yes  Notes to clinic: not delegated; script per Dr Lurline Hare

## 2020-05-24 NOTE — Telephone Encounter (Signed)
Agree with ER for eval, sent in some zofran to have around for symptomatic relief but does need to be evaluated immediately for cause

## 2020-05-24 NOTE — Telephone Encounter (Signed)
Patients daughter called stating that she has her mother at the ER for diarrhea nausea and pain to her abdomin.  She states that her mother took an expired zofran 8 mg this am and is now feeling better.She states that her mother has diverticulosis and has symptoms like this many times.  She wanted to just schedule at office and leave the ER.. Per previous triage daughter will follow advice and keep her mother at the ER for evaluation. Daughter would like to have zofran 8 mg ordered for her mother and sent to [address]. Daughter was informed that a prior order was sent to office for 4 mg. Rx has expired. She verbalized understanding.  Will sent request via note to office. daughter is requesting call back when ordered.

## 2020-05-24 NOTE — ED Notes (Signed)
Pt given cup for urine sample 

## 2020-05-24 NOTE — ED Triage Notes (Signed)
First Nurse Note:  C/O diarrhea.  Patient has history of diverticulitis.  Is concerned she will become dehydrated.

## 2020-05-24 NOTE — Telephone Encounter (Signed)
Looks like patient is at ED

## 2020-05-24 NOTE — Telephone Encounter (Addendum)
Rx sent back to the provider after failed transmission. Previously ordered by PCP.

## 2020-05-24 NOTE — Telephone Encounter (Signed)
Pt with daughter there with her on speaker phone called in c/o nausea and very watery diarrhea and abd pain.   The watery diarrhea started this morning.  She has not felt well for a week.  She is unable to take in any fluids or food due to the nausea. They are sitting in the car in the parking lot of the ED now during this call.  She has diverticulitis and informed me she has been in the hospital with this several times.   When she feels nausea like now it's so bad she can't eat or drink anything.   The last fluids she had were last night.  She is c/o her whole abd cramping.   "I can't drink or eat anything because it makes me want to throw up".  She did take an expired Zofran (expired last April a year ago) that was given to her from a prior ED visit for these same symptoms this morning.   It has helped some but still having nausea and watery diarrhea and abd pain.  "We are sitting outside the emergency room now trying to decide whether to go in or not".   Per daughter "She ends up in the hospital with these episodes a lot of the time".   "Last time was last year".  I advised her to go on into the ED and be evaluated.   Since she can't take in food or fluids and having the watery diarrhea she is going to become dehydrated quickly.  Maybe this will prevent an admission by taking care of this early on.  Since you are there in the ED parking lot  go on in before you start getting worse.  Pt and daughter agreed with this plan and will go on into the ED.  I sent my notes to Merrie Roof at [facility].      Reason for Disposition . [1] Constant abdominal pain AND [2] present > 2 hours    Nausea and watery diarrhea.   Has diverticulitis that flares up and she ends up in the hospital.  Answer Assessment - Initial Assessment Questions 1. DIARRHEA SEVERITY: "How bad is the diarrhea?" "How many extra stools have you had in the past 24 hours than normal?"    - NO DIARRHEA (SCALE 0)   -  MILD (SCALE 1-3): Few loose or mushy BMs; increase of 1-3 stools over normal daily number of stools; mild increase in ostomy output.   -  MODERATE (SCALE 4-7): Increase of 4-6 stools daily over normal; moderate increase in ostomy output. * SEVERE (SCALE 8-10; OR 'WORST POSSIBLE'): Increase of 7 or more stools daily over normal; moderate increase in ostomy output; incontinence.     I'm having watery diarrhea this morning.   I've lost my appetite.  I feel nausea.  I do have diverticulitis.   I've gone to the hospital for the nausea because I get dehydrated.   I'm not feeling well for the past week.   2. ONSET: "When did the diarrhea begin?"      This morning 3. BM CONSISTENCY: "How loose or watery is the diarrhea?"      Very watery 4. VOMITING: "Are you also vomiting?" If Yes, ask: "How many times in the past 24 hours?"      Not yet but I've been in the hospital when I was nausea.   In last 5 years been 4 times in hospital for nausea.   Daughter got on the  phone. She doesn't usually doesn't have diarrhea.  She is also having abd pain. 5. ABDOMINAL PAIN: "Are you having any abdominal pain?" If Yes, ask: "What does it feel like?" (e.g., crampy, dull, intermittent, constant)      Yes all stomach and lower abd. 6. ABDOMINAL PAIN SEVERITY: If present, ask: "How bad is the pain?"  (e.g., Scale 1-10; mild, moderate, or severe)   - MILD (1-3): doesn't interfere with normal activities, abdomen soft and not tender to touch    - MODERATE (4-7): interferes with normal activities or awakens from sleep, tender to touch    - SEVERE (8-10): excruciating pain, doubled over, unable to do any normal activities       *No Answer* 7. ORAL INTAKE: If vomiting, "Have you been able to drink liquids?" "How much fluids have you had in the past 24 hours?"     Last fluids was last night.  Daughter came to her mother's house this morning.  Took Zofran this morning and it has helped some but it expired last year.  Still having  diarrhea. 8. HYDRATION: "Any signs of dehydration?" (e.g., dry mouth [not just dry lips], too weak to stand, dizziness, new weight loss) "When did you last urinate?"     No dizziness.  Diarrhea started this morning.   9. EXPOSURE: "Have you traveled to a foreign country recently?" "Have you been exposed to anyone with diarrhea?" "Could you have eaten any food that was spoiled?"     No  To all above 10. ANTIBIOTIC USE: "Are you taking antibiotics now or have you taken antibiotics in the past 2 months?"       No 11. OTHER SYMPTOMS: "Do you have any other symptoms?" (e.g., fever, blood in stool)       No denies dizziness or weakness 12. PREGNANCY: "Is there any chance you are pregnant?" "When was your last menstrual period?"       N/A due to age  Protocols used: DIARRHEA-A-AH

## 2020-05-25 MED ORDER — ONDANSETRON 4 MG PO TBDP: 4.0000 mg | ORAL_TABLET | Freq: Three times a day (TID) | ORAL | 0 refills | Status: DC | PRN

## 2020-06-15 ENCOUNTER — Other Ambulatory Visit: Payer: Medicare HMO

## 2020-06-15 ENCOUNTER — Other Ambulatory Visit: Payer: Self-pay

## 2020-06-19 ENCOUNTER — Telehealth: Payer: Self-pay | Admitting: Family Medicine

## 2020-06-19 DIAGNOSIS — M419 Scoliosis, unspecified: Secondary | ICD-10-CM | POA: Diagnosis not present

## 2020-06-19 DIAGNOSIS — R1084 Generalized abdominal pain: Secondary | ICD-10-CM | POA: Diagnosis not present

## 2020-06-19 DIAGNOSIS — I878 Other specified disorders of veins: Secondary | ICD-10-CM | POA: Diagnosis not present

## 2020-06-19 DIAGNOSIS — M160 Bilateral primary osteoarthritis of hip: Secondary | ICD-10-CM | POA: Diagnosis not present

## 2020-06-19 DIAGNOSIS — R110 Nausea: Secondary | ICD-10-CM | POA: Diagnosis not present

## 2020-06-19 DIAGNOSIS — K6389 Other specified diseases of intestine: Secondary | ICD-10-CM | POA: Diagnosis not present

## 2020-06-19 NOTE — Telephone Encounter (Signed)
Copied from [place] [id number]. Topic: Medicare AWV >> May 22, 2020  2:51 PM Cher Nakai R wrote: Reason for CRM: Called patient to schedule Medicare Annual Wellness Visit with Nurse Health Advisor at [facility]  If patient returns call, please schedule for any date.  Last AWV completed: 04/14/2018  Appointment length should be 45 minutes  Thank you,  Colletta [place] [phone]

## 2020-06-20 ENCOUNTER — Encounter: Payer: Medicare HMO | Admitting: Family Medicine

## 2020-07-05 ENCOUNTER — Ambulatory Visit (INDEPENDENT_AMBULATORY_CARE_PROVIDER_SITE_OTHER): Payer: Medicare HMO | Admitting: Nurse Practitioner

## 2020-07-05 ENCOUNTER — Encounter: Payer: Self-pay | Admitting: Nurse Practitioner

## 2020-07-05 ENCOUNTER — Other Ambulatory Visit: Payer: Self-pay

## 2020-07-05 VITALS — BP 132/83 | HR 89 | Temp 98.4°F | Wt 102.0 lb

## 2020-07-05 DIAGNOSIS — R5383 Other fatigue: Secondary | ICD-10-CM

## 2020-07-05 DIAGNOSIS — J309 Allergic rhinitis, unspecified: Secondary | ICD-10-CM

## 2020-07-05 DIAGNOSIS — R0683 Snoring: Secondary | ICD-10-CM

## 2020-07-05 MED ORDER — FLUTICASONE PROPIONATE 50 MCG/ACT NA SUSP: 2.0000 | Freq: Every day | NASAL | 2 refills | Status: DC

## 2020-07-05 MED ORDER — CETIRIZINE HCL 10 MG PO TABS: 10.0000 mg | ORAL_TABLET | Freq: Every day | ORAL | 1 refills | Status: DC

## 2020-07-05 NOTE — Progress Notes (Signed)
BP 132/83    Pulse 89    Temp 98.4 F (36.9 C) (Oral)    Wt 102 lb (46.3 kg)    SpO2 98%    BMI 19.27 kg/m    Subjective:    Patient ID: Kayla Johnson, female    DOB: [birth year], 74 y.o.   MRN: [id number]  HPI: Kayla Johnson is a 74 y.o. female presenting with fatigue.   Chief Complaint  Patient presents with   Fatigue    pt states she has been very fatigued and tired lately. States she just hasnt been feeling good or right for the last 2 months. Has tried taking Gas-X with no relief    FATIGUE Reports having this fatigue for many months.  Reports "brain fog" and has a hard time thinking of words. Has also been referred to GI for ongoing gas in her upper abdomen - appointment in middle of September but "cannot wait that long."  Has been taking Gas-Ex pretty consistently for gas.   Duration:  months Severity: feels like laying down a lot; moderate  Onset: gradual Context when symptoms started:  unknown Symptoms improve with rest: yes  Depressive symptoms: no Stress/anxiety: no Insomnia: no  Snoring: yes  - never had evaluation with sleep apnea Observed apnea by bed partner: no Daytime hypersomnolence:yes Wakes feeling refreshed: yes History of sleep study: no Dysnea on exertion:  no Orthopnea/PND: no Chest pain: no Chronic cough: no Lower extremity edema: yes; when working (stands for 8 hours at a time)  Arthralgias:no Myalgias: no Weakness: yes Rash: no  TINNITUS Duration: days Description of tinnitus: high-pitch ringing Pulsatile: yes Tinnitus duration: days Episode frequency: continous Severity: mild Aggravating factors: does not know Alleviating factors: does not know Head injury: no Chronic exposure to loud noises: no Exposure to ototoxic medications: no Vertigo:no Hearing loss: no Aural fullness: no Headache:no  TMJ syndrome symptoms: no Unsteady gait: no Postural instability: no Diplopia, dysarthria, dysphagia: no Anxietydepression: no   No Known  Allergies  Outpatient Encounter Medications as of 07/05/2020  Medication Sig   Cyanocobalamin (VITAMIN B 12 PO) Take by mouth daily.   ondansetron (ZOFRAN ODT) 4 MG disintegrating tablet Take 1 tablet (4 mg total) by mouth every 8 (eight) hours as needed.   triamcinolone cream (KENALOG) 0.1 % 2 (two) times daily as needed   Vitamin D, Ergocalciferol, (DRISDOL) 1.25 MG (50000 UNIT) CAPS capsule Take 1 capsule (50,000 Units total) by mouth every 7 (seven) days.   cetirizine (ZYRTEC) 10 MG tablet Take 1 tablet (10 mg total) by mouth daily.   fluticasone (FLONASE) 50 MCG/ACT nasal spray Place 2 sprays into both nostrils daily.   Facility-Administered Encounter Medications as of 07/05/2020  Medication   cyanocobalamin ((VITAMIN B-12)) injection 1,000 mcg   cyanocobalamin ((VITAMIN B-12)) injection 1,000 mcg   Patient Active Problem List   Diagnosis Date Noted   Advanced care planning/counseling discussion 04/14/2018   Shoulder impingement syndrome, right 03/03/2018   Urticaria 03/03/2018   Skin lesion of right leg 03/03/2018   Anemia 01/01/2017   Fatigue 69/48/5462   Lichen sclerosus of female genitalia 12/31/2016   Bilateral tinnitus 12/12/2015   Allergic rhinitis 12/12/2015   Pernicious anemia 10/22/2015   Past Medical History:  Diagnosis Date   Carpal tunnel syndrome    Lichen sclerosus    Pernicious anemia    Shoulder bursitis    Relevant past medical, surgical, family and social history reviewed and updated as indicated. Interim medical history since our  last visit reviewed.  Review of Systems  Constitutional: Positive for fatigue. Negative for activity change, appetite change, chills, diaphoresis, fever and unexpected weight change.  HENT: Positive for tinnitus. Negative for congestion, hearing loss, postnasal drip, rhinorrhea and sinus pain.   Eyes: Negative.  Negative for visual disturbance.  Respiratory: Negative for chest tightness, shortness of breath  and wheezing.        +snoring at night  Cardiovascular: Positive for leg swelling. Negative for chest pain and palpitations.  Gastrointestinal: Positive for abdominal distention. Negative for diarrhea, nausea and vomiting.  Musculoskeletal: Negative.  Negative for arthralgias and myalgias.  Skin: Negative.  Negative for rash.  Neurological: Positive for weakness. Negative for dizziness, light-headedness and headaches.  Hematological: Negative.  Negative for adenopathy.  Psychiatric/Behavioral: Positive for decreased concentration. Negative for behavioral problems, confusion and sleep disturbance. The patient is not nervous/anxious and is not hyperactive.     Per HPI unless specifically indicated above     Objective:    BP 132/83    Pulse 89    Temp 98.4 F (36.9 C) (Oral)    Wt 102 lb (46.3 kg)    SpO2 98%    BMI 19.27 kg/m   Wt Readings from Last 3 Encounters:  07/05/20 102 lb (46.3 kg)  05/24/20 104 lb 8 oz (47.4 kg)  05/02/20 104 lb (47.2 kg)    Physical Exam Vitals and nursing note reviewed.  Constitutional:      General: She is not in acute distress.    Appearance: Normal appearance. She is not toxic-appearing.  HENT:     Right Ear: Tympanic membrane, ear canal and external ear normal.     Left Ear: Tympanic membrane, ear canal and external ear normal.     Nose: Nose normal. No congestion.     Mouth/Throat:     Mouth: Mucous membranes are moist.     Pharynx: Oropharynx is clear. No oropharyngeal exudate or posterior oropharyngeal erythema.  Eyes:     General: No scleral icterus.    Extraocular Movements: Extraocular movements intact.     Comments: Conjunctivae pale  Cardiovascular:     Rate and Rhythm: Regular rhythm.     Heart sounds: Normal heart sounds. No murmur heard.   Pulmonary:     Effort: Pulmonary effort is normal. No respiratory distress.     Breath sounds: Normal breath sounds. No wheezing, rhonchi or rales.  Abdominal:     General: Abdomen is flat.  Bowel sounds are normal.  Musculoskeletal:        General: Normal range of motion.     Cervical back: Normal range of motion.     Right lower leg: No edema.     Left lower leg: No edema.  Lymphadenopathy:     Cervical: No cervical adenopathy.  Skin:    General: Skin is warm and dry.     Coloration: Skin is not jaundiced or pale.  Neurological:     General: No focal deficit present.     Mental Status: She is alert and oriented to person, place, and time.     Motor: No weakness.     Gait: Gait normal.  Psychiatric:        Mood and Affect: Mood normal.        Behavior: Behavior normal.        Thought Content: Thought content normal.        Judgment: Judgment normal.        Assessment & Plan:  Problem List Items Addressed This Visit      Respiratory   Allergic rhinitis - Primary    Chronic, ongoing.  Possible that this is cause of tinnitus.  Restart allergy medication with daily cetirizine and flonase.  Monitor for symptom relief.  If does not help after 1 month, return to clinic.      Relevant Medications   cetirizine (ZYRTEC) 10 MG tablet   fluticasone (FLONASE) 50 MCG/ACT nasal spray     Other   Fatigue    Chronic, ongoing.  TSH recently checked at urgent care and normal.  With history of pernicious anemia, will check CBC with anemia panel.  Also check folate and Vitamin B12.  Will order sleep study also to rule out obstructive sleep apnea.  Follow up pending results.       Relevant Orders   CBC with Differential/Platelet (Completed)   Vitamin B12   Folate (Completed)   Anemia panel   Ambulatory referral to Sleep Studies    Other Visit Diagnoses    Snoring       Relevant Orders   Ambulatory referral to Sleep Studies       Follow up plan: Return if symptoms worsen or fail to improve.   40+ minutes of time spent with the patient today.

## 2020-07-05 NOTE — Patient Instructions (Addendum)
Good meeting you Kayla Johnson.  We will let you know about your blood work tomorrow.  You will hear from our referral team to set up the sleep study.  Be sure to re-start on your allergy medication - I have sent it over to your pharmacy.   Sleep Studies A sleep study (polysomnogram) is a series of tests done while you are sleeping. A sleep study records your brain waves, heart rate, breathing rate, oxygen level, and eye and leg movements. A sleep study helps your health care provider:  See how well you sleep.  Diagnose a sleep disorder.  Determine how severe your sleep disorder is.  Create a plan to treat your sleep disorder. Your health care provider may recommend a sleep study if you:  Feel sleepy on most days.  Snore loudly while sleeping.  Have unusual behaviors while you sleep, such as walking.  Have brief periods in which you stop breathing during sleep (sleepapnea).  Fall asleep suddenly during the day (narcolepsy).  Have trouble falling asleep or staying asleep (insomnia).  Feel like you need to move your legs when trying to fall asleep (restless legs syndrome).  Move your legs by flexing and extending them regularly while asleep (periodic limb movement disorder).  Act out your dreams while you sleep (sleep behavior disorder).  Feel like you cannot move when you first wake up (sleep paralysis). What tests are part of a sleep study? Most sleep studies record the following during sleep:  Brain activity.  Eye movements.  Heart rate and rhythm.  Breathing rate and rhythm.  Blood-oxygen level.  Blood pressure.  Chest and belly movement as you breathe.  Arm and leg movements.  Snoring or other noises.  Body position. Where are sleep studies done? Sleep studies are done at sleep centers. A sleep center may be inside a hospital, office, or clinic. The room where you have the study may look like a hospital room or a hotel room. The health care providers doing  the study may come in and out of the room during the study. Most of the time, they will be in another room monitoring your test as you sleep. How are sleep studies done? Most sleep studies are done during a normal period of time for a full night of sleep. You will arrive at the study center in the evening and go home in the morning. Before the test  Bring your pajamas and toothbrush with you to the sleep study.  Do not have caffeine on the day of your sleep study.  Do not drink alcohol on the day of your sleep study.  Your health care provider will let you know if you should stop taking any of your regular medicines before the test. During the test      Round, sticky patches with sensors attached to recording wires (electrodes) are placed on your scalp, face, chest, and limbs.  Wires from all the electrodes and sensors run from your bed to a computer. The wires can be taken off and put back on if you need to get out of bed to go to the bathroom.  A sensor is placed over your nose to measure airflow.  A finger clip is put on your finger or ear to measure your blood oxygen level (pulse oximetry).  A belt is placed around your belly and a belt is placed around your chest to measure breathing movements.  If you have signs of the sleep disorder called sleep apnea during your test,  you may get a treatment mask to wear for the second half of the night. ? The mask provides positive airway pressure (PAP) to help you breathe better during sleep. This may greatly improve your sleep apnea. ? You will then have all tests done again with the mask in place to see if your measurements and recordings change. After the test  A medical doctor who specializes in sleep will evaluate the results of your sleep study and share them with you and your primary health care provider.  Based on your results, your medical history, and a physical exam, you may be diagnosed with a sleep disorder, such as: ? Sleep  apnea. ? Restless legs syndrome. ? Sleep-related behavior disorder. ? Sleep-related movement disorders. ? Sleep-related seizure disorders.  Your health care team will help determine your treatment options based on your diagnosis. This may include: ? Improving your sleep habits (sleep hygiene). ? Wearing a continuous positive airway pressure (CPAP) or bi-level positive airway pressure (BPAP) mask. ? Wearing an oral device at night to improve breathing and reduce snoring. ? Taking medicines. Follow these instructions at home:  Take over-the-counter and prescription medicines only as told by your health care provider.  If you are instructed to use a CPAP or BPAP mask, make sure you use it nightly as directed.  Make any lifestyle changes that your health care provider recommends.  If you were given a device to open your airway while you sleep, use it only as told by your health care provider.  Do not use any tobacco products, such as cigarettes, chewing tobacco, and e-cigarettes. If you need help quitting, ask your health care provider.  Keep all follow-up visits as told by your health care provider. This is important. Summary  A sleep study (polysomnogram) is a series of tests done while you are sleeping. It shows how well you sleep.  Most sleep studies are done over one full night of sleep. You will arrive at the study center in the evening and go home in the morning.  If you have signs of the sleep disorder called sleep apnea during your test, you may get a treatment mask to wear for the second half of the night.  A medical doctor who specializes in sleep will evaluate the results of your sleep study and share them with your primary health care provider. This information is not intended to replace advice given to you by your health care provider. Make sure you discuss any questions you have with your health care provider. Document Revised: 05/04/2019 Document Reviewed:  12/15/2017 Elsevier Patient Education  [place].    Vitamin B12 Deficiency Vitamin B12 deficiency means that your body does not have enough vitamin B12. The body needs this vitamin:  To make red blood cells.  To make genes (DNA).  To help the nerves work. If you do not have enough vitamin B12 in your body, you can have health problems. What are the causes?  Not eating enough foods that contain vitamin B12.  Not being able to absorb vitamin B12 from the food that you eat.  Certain digestive system diseases.  A condition in which the body does not make enough of a certain protein, which results in too few red blood cells (pernicious anemia).  Having a surgery in which part of the stomach or small intestine is removed.  Taking medicines that make it hard for the body to absorb vitamin B12. These medicines include: ? Heartburn medicines. ? Some antibiotic  medicines. ? Other medicines that are used to treat certain conditions. What increases the risk?  Being older than age 63.  Eating a vegetarian or vegan diet, especially while you are pregnant.  Eating a poor diet while you are pregnant.  Taking certain medicines.  Having alcoholism. What are the signs or symptoms? In some cases, there are no symptoms. If the condition leads to too few blood cells or nerve damage, symptoms can occur, such as:  Feeling weak.  Feeling tired (fatigued).  Not being hungry.  Weight loss.  A loss of feeling (numbness) or tingling in your hands and feet.  Redness and burning of the tongue.  Being mixed up (confused) or having memory problems.  Sadness (depression).  Problems with your senses. This can include color blindness, ringing in the ears, or loss of taste.  Watery poop (diarrhea) or trouble pooping (constipation).  Trouble walking. If anemia is very bad, symptoms can include:  Being short of breath.  Being dizzy.  Having a very fast heartbeat. How is  this treated?  Changing the way you eat and drink, such as: ? Eating more foods that contain vitamin B12. ? Drinking little or no alcohol.  Getting vitamin B12 shots.  Taking vitamin B12 supplements. Your doctor will tell you the dose that is best for you. Follow these instructions at home: Eating and drinking   Eat lots of healthy foods that contain vitamin B12. These include: ? Meats and poultry, such as beef, pork, chicken, [place], and organ meats, such as liver. ? Seafood, such as clams, rainbow trout, salmon, tuna, and haddock. ? Eggs. ? Cereal and dairy products that have vitamin B12 added to them. Check the label. The items listed above may not be a complete list of what you can eat and drink. Contact a dietitian for more options. General instructions  Get any shots as told by your doctor.  Take supplements only as told by your doctor.  Do not drink alcohol if your doctor tells you not to. In some cases, you may only be asked to limit alcohol use.  Keep all follow-up visits as told by your doctor. This is important. Contact a doctor if:  Your symptoms come back. Get help right away if:  You have trouble breathing.  You have a very fast heartbeat.  You have chest pain.  You get dizzy.  You pass out. Summary  Vitamin B12 deficiency means that your body is not getting enough vitamin B12.  In some cases, there are no symptoms of this condition.  Treatment may include making a change in the way you eat and drink, getting vitamin B12 shots, or taking supplements.  Eat lots of healthy foods that contain vitamin B12. This information is not intended to replace advice given to you by your health care provider. Make sure you discuss any questions you have with your health care provider. Document Revised: 07/27/2018 Document Reviewed: 07/27/2018 Elsevier Patient Education  2020 [employer].

## 2020-07-06 LAB — ANEMIA PANEL
Ferritin: 19 ng/mL (ref 15–150)
Folate, Hemolysate: 588 ng/mL
Folate, RBC: 1695 ng/mL (ref >498)
Hematocrit: 34.7 % (ref 34.0–46.6)
Iron Saturation: 12 % — ABNORMAL LOW (ref 15–55)
Iron: 41 ug/dL (ref 27–139)
Retic Ct Pct: 0.6 % (ref 0.6–2.6)
Total Iron Binding Capacity: 349 ug/dL (ref 250–450)
UIBC: 308 ug/dL (ref 118–369)
Vitamin B-12: >2000 pg/mL — ABNORMAL HIGH (ref 232–1245)

## 2020-07-06 LAB — FOLATE: Folate: 14.6 ng/mL (ref >3.0)

## 2020-07-06 LAB — CBC WITH DIFFERENTIAL/PLATELET
Basophils Absolute: 0 10*3/uL (ref 0.0–0.2)
Basos: 1 %
EOS (ABSOLUTE): 0 10*3/uL (ref 0.0–0.4)
Eos: 1 %
Hemoglobin: 11.7 g/dL (ref 11.1–15.9)
Immature Grans (Abs): 0 10*3/uL (ref 0.0–0.1)
Immature Granulocytes: 0 %
Lymphocytes Absolute: 1.6 10*3/uL (ref 0.7–3.1)
Lymphs: 30 %
MCH: 27.4 pg (ref 26.6–33.0)
MCHC: 33.7 g/dL (ref 31.5–35.7)
MCV: 81 fL (ref 79–97)
Monocytes Absolute: 0.4 10*3/uL (ref 0.1–0.9)
Monocytes: 7 %
Neutrophils Absolute: 3.2 10*3/uL (ref 1.4–7.0)
Neutrophils: 61 %
Platelets: 359 10*3/uL (ref 150–450)
RBC: 4.27 x10E6/uL (ref 3.77–5.28)
RDW: 16.6 % — ABNORMAL HIGH (ref 11.7–15.4)
WBC: 5.2 10*3/uL (ref 3.4–10.8)

## 2020-07-06 NOTE — Assessment & Plan Note (Addendum)
Chronic, ongoing.  TSH recently checked at urgent care and normal.  With history of pernicious anemia, will check CBC with anemia panel.  Also check folate and Vitamin B12.  Will order sleep study also to rule out obstructive sleep apnea.  Follow up pending results.

## 2020-07-06 NOTE — Assessment & Plan Note (Signed)
Chronic, ongoing.  Possible that this is cause of tinnitus.  Restart allergy medication with daily cetirizine and flonase.  Monitor for symptom relief.  If does not help after 1 month, return to clinic.

## 2020-07-09 ENCOUNTER — Telehealth: Payer: Self-pay | Admitting: Family Medicine

## 2020-07-09 NOTE — Telephone Encounter (Signed)
Have you review patient's labs yet?

## 2020-07-09 NOTE — Telephone Encounter (Signed)
Copied from [place] [id number]. Topic: Quick Communication - Lab Results (Clinic Use ONLY) >> Jul 09, 2020  9:47 AM Scherrie Gerlach wrote: Pt would like a call back to go over the last lab

## 2020-07-09 NOTE — Telephone Encounter (Signed)
Just sent result note via mychart:    Good afternoon Leanor, Your lab work has come back and some of your labs point to a possible iron-deficiency.  I would recommend starting on a once daily ferrous sulfate 325 mg supplement.  This can be found over-the-counter.  Try to take it on an empty stomach with a small glass of orange juice - the acid in the orange juice will help it to absorb better.   Otherwise, there is nothing in your labs that would explain your fatigue.  I highly recommend the sleep study to evaluate for sleep apnea.  That referral has been put in and hopefully you will be hearing back soon about setting that up.  Please let us know if you have any further questions or concerns. Janett Billow

## 2020-07-09 NOTE — Telephone Encounter (Signed)
Looks like Janett Billow saw her last, will route this to her to make her aware patient called requesting lab results.  Thanks.:)

## 2020-07-12 DIAGNOSIS — R1031 Right lower quadrant pain: Secondary | ICD-10-CM | POA: Diagnosis not present

## 2020-07-12 DIAGNOSIS — R0789 Other chest pain: Secondary | ICD-10-CM | POA: Diagnosis not present

## 2020-07-12 DIAGNOSIS — R1013 Epigastric pain: Secondary | ICD-10-CM | POA: Diagnosis not present

## 2020-07-12 DIAGNOSIS — R634 Abnormal weight loss: Secondary | ICD-10-CM | POA: Diagnosis not present

## 2020-07-12 DIAGNOSIS — R140 Abdominal distension (gaseous): Secondary | ICD-10-CM | POA: Diagnosis not present

## 2020-07-12 DIAGNOSIS — R1084 Generalized abdominal pain: Secondary | ICD-10-CM | POA: Diagnosis not present

## 2020-07-12 DIAGNOSIS — R630 Anorexia: Secondary | ICD-10-CM | POA: Diagnosis not present

## 2020-07-12 DIAGNOSIS — R194 Change in bowel habit: Secondary | ICD-10-CM | POA: Diagnosis not present

## 2020-07-13 DIAGNOSIS — R1084 Generalized abdominal pain: Secondary | ICD-10-CM | POA: Diagnosis not present

## 2020-08-07 ENCOUNTER — Encounter: Payer: Self-pay | Admitting: Family Medicine

## 2020-08-07 ENCOUNTER — Other Ambulatory Visit: Payer: Self-pay

## 2020-08-07 ENCOUNTER — Ambulatory Visit (INDEPENDENT_AMBULATORY_CARE_PROVIDER_SITE_OTHER): Payer: Medicare HMO | Admitting: Family Medicine

## 2020-08-07 VITALS — BP 131/77 | HR 79 | Temp 98.2°F | Ht 62.0 in | Wt 101.0 lb

## 2020-08-07 DIAGNOSIS — R1013 Epigastric pain: Secondary | ICD-10-CM | POA: Diagnosis not present

## 2020-08-07 DIAGNOSIS — E559 Vitamin D deficiency, unspecified: Secondary | ICD-10-CM | POA: Insufficient documentation

## 2020-08-07 MED ORDER — SUCRALFATE 1 G PO TABS: 1.0000 g | ORAL_TABLET | Freq: Three times a day (TID) | ORAL | 3 refills | Status: DC

## 2020-08-07 MED ORDER — OMEPRAZOLE 20 MG PO CPDR: 20.0000 mg | DELAYED_RELEASE_CAPSULE | Freq: Every day | ORAL | 3 refills | Status: DC

## 2020-08-07 NOTE — Progress Notes (Signed)
EKG interpreted by me today. NSR at 72bpm, no ST segment changes.

## 2020-08-07 NOTE — Progress Notes (Signed)
BP 131/77 (BP Location: Left Arm, Patient Position: Sitting, Cuff Size: Normal)   Pulse 79   Temp 98.2 F (36.8 C) (Oral)   Ht 5\' 2"  (1.575 m)   Wt 101 lb (45.8 kg)   SpO2 97%   BMI 18.47 kg/m    Subjective:    Patient ID: Kayla Johnson, female    DOB: 1946-10-09, 74 y.o.   MRN: [id number]  HPI: Kayla Johnson is a 74 y.o. female  Chief Complaint  Patient presents with  . Abdominal Pain   CHEST PAIN- has been having pain at her xyphoid process for several months only if she touched it. About a month ago, she started with explosive diarrhea and she started having changes in her bowel habits where she would have severe constipation. Feeling nauseous, so has been avoiding eating. She had pain around her entire belly. She notes that it's more of a bloating cramping pain. Pain seems to radiate into her back.   Time since onset: about 4-5 weeks Onset: sudden Quality: cramping Severity: moderate Location: epigastric and whole abdomen Radiation: back Episode duration:  Frequency: constant Related to exertion: no Activity when pain started:  Trauma: no Anxiety/recent stressors: yes Alleviating factors: walking, laying flat  Status: stable Treatments attempted: APAP and antacids  Current pain status: in pain Shortness of breath: yes Cough: no Nausea: yes Diaphoresis: no Heartburn: yes Palpitations: yes  Relevant past medical, surgical, family and social history reviewed and updated as indicated. Interim medical history since our last visit reviewed. Allergies and medications reviewed and updated.  Review of Systems  Constitutional: Negative.   Respiratory: Negative.   Cardiovascular: Positive for chest pain. Negative for palpitations and leg swelling.  Gastrointestinal: Positive for abdominal pain, constipation and nausea. Negative for abdominal distention, anal bleeding, blood in stool, diarrhea, rectal pain and vomiting.  Musculoskeletal: Negative.   Skin: Negative.     Psychiatric/Behavioral: Negative.     Per HPI unless specifically indicated above     Objective:    BP 131/77 (BP Location: Left Arm, Patient Position: Sitting, Cuff Size: Normal)   Pulse 79   Temp 98.2 F (36.8 C) (Oral)   Ht 5\' 2"  (1.575 m)   Wt 101 lb (45.8 kg)   SpO2 97%   BMI 18.47 kg/m   Wt Readings from Last 3 Encounters:  08/07/20 101 lb (45.8 kg)  07/05/20 102 lb (46.3 kg)  05/24/20 104 lb 8 oz (47.4 kg)    Physical Exam Vitals and nursing note reviewed.  Constitutional:      General: She is not in acute distress.    Appearance: Normal appearance. She is not ill-appearing, toxic-appearing or diaphoretic.  HENT:     Head: Normocephalic and atraumatic.     Right Ear: External ear normal.     Left Ear: External ear normal.     Nose: Nose normal.     Mouth/Throat:     Mouth: Mucous membranes are moist.     Pharynx: Oropharynx is clear.  Eyes:     General: No scleral icterus.       Right eye: No discharge.        Left eye: No discharge.     Extraocular Movements: Extraocular movements intact.     Conjunctiva/sclera: Conjunctivae normal.     Pupils: Pupils are equal, round, and reactive to light.  Cardiovascular:     Rate and Rhythm: Normal rate and regular rhythm.     Pulses: Normal pulses.  Heart sounds: Normal heart sounds. No murmur heard.  No friction rub. No gallop.   Pulmonary:     Effort: Pulmonary effort is normal. No respiratory distress.     Breath sounds: Normal breath sounds. No stridor. No wheezing, rhonchi or rales.  Chest:     Chest Mapps: No tenderness.  Abdominal:     General: Abdomen is flat. Bowel sounds are normal. There is no distension or abdominal bruit. There are no signs of injury.     Palpations: Abdomen is soft. There is no shifting dullness, fluid wave, hepatomegaly, splenomegaly, mass or pulsatile mass.     Tenderness: There is abdominal tenderness in the epigastric area.     Hernia: No hernia is present.  Musculoskeletal:         General: Normal range of motion.     Cervical back: Normal range of motion and neck supple.  Skin:    General: Skin is warm and dry.     Capillary Refill: Capillary refill takes less than 2 seconds.     Coloration: Skin is not jaundiced or pale.     Findings: No bruising, erythema, lesion or rash.  Neurological:     General: No focal deficit present.     Mental Status: She is alert and oriented to person, place, and time. Mental status is at baseline.  Psychiatric:        Mood and Affect: Mood normal.        Behavior: Behavior normal.        Thought Content: Thought content normal.        Judgment: Judgment normal.     Results for orders placed or performed in visit on 07/05/20  CBC with Differential/Platelet  Result Value Ref Range   WBC 5.2 3.4 - 10.8 x10E3/uL   RBC 4.27 3.77 - 5.28 x10E6/uL   Hemoglobin 11.7 11.1 - 15.9 g/dL   MCV 81 79 - 97 fL   MCH 27.4 26.6 - 33.0 pg   MCHC 33.7 31 - 35 g/dL   RDW 16.6 (H) 11.7 - 15.4 %   Platelets 359 150 - 450 x10E3/uL   Neutrophils 61 Not Estab. %   Lymphs 30 Not Estab. %   Monocytes 7 Not Estab. %   Eos 1 Not Estab. %   Basos 1 Not Estab. %   Neutrophils Absolute 3.2 1 - 7 x10E3/uL   Lymphocytes Absolute 1.6 0 - 3 x10E3/uL   Monocytes Absolute 0.4 0 - 0 x10E3/uL   EOS (ABSOLUTE) 0.0 0.0 - 0.4 x10E3/uL   Basophils Absolute 0.0 0 - 0 x10E3/uL   Immature Granulocytes 0 Not Estab. %   Immature Grans (Abs) 0.0 0.0 - 0.1 x10E3/uL  Folate  Result Value Ref Range   Folate 14.6 >3.0 ng/mL  Anemia panel  Result Value Ref Range   Total Iron Binding Capacity 349 250 - 450 ug/dL   UIBC 308 118 - 369 ug/dL   Iron 41 27 - 139 ug/dL   Iron Saturation 12 (L) 15 - 55 %   Vitamin B-12 >2000 (H) 232 - 1245 pg/mL   Folate, Hemolysate 588.0 Not Estab. ng/mL   Hematocrit 34.7 34.0 - 46.6 %   Folate, RBC 1,695 >498 ng/mL   Ferritin 19 15.0 - 150.0 ng/mL   Retic Ct Pct 0.6 0.6 - 2.6 %      Assessment & Plan:   Problem List Items  Addressed This Visit      Other   Epigastric pain -  Primary    EKG normal. To have EGD and colon next month. Concern for gastritis vs ulcer. Will treat with omprazole and carafate and check FOBT. Await results. Check in 2 weeks. Call with any concerns.       Relevant Orders   EKG 12-Lead (Completed)   Fecal occult blood, imunochemical([facility])       Follow up plan: Return in about 2 weeks (around 08/21/2020).

## 2020-08-07 NOTE — Assessment & Plan Note (Signed)
EKG normal. To have EGD and colon next month. Concern for gastritis vs ulcer. Will treat with omprazole and carafate and check FOBT. Await results. Check in 2 weeks. Call with any concerns.

## 2020-08-07 NOTE — Progress Notes (Deleted)
There were no vitals taken for this visit.   Subjective:    Patient ID: Kayla Johnson, female    DOB: 1946/06/04, 74 y.o.   MRN: [id number]  HPI: Kayla Johnson is a 74 y.o. female presenting on 08/07/2020 for comprehensive medical examination. Current medical complaints include:{Blank single:19197::"none","***"}  She currently lives with: Menopausal Symptoms: {Blank single:19197::"yes","no"}  Functional Status Survey:    Fall Risk  07/05/2020 06/15/2019 04/14/2018 04/14/2018 12/31/2016  Falls in the past year? 0 0 No No No  Number falls in past yr: 0 0 - - -  Injury with Fall? 0 0 - - -  Follow up Falls evaluation completed Falls evaluation completed - - -    Depression Screen Depression screen [facility] 2/9 07/05/2020 06/15/2019 04/14/2018 04/14/2018 12/31/2016  Decreased Interest 0 0 0 0 0  Down, Depressed, Hopeless 0 0 0 0 0  PHQ - 2 Score 0 0 0 0 0  Altered sleeping - 0 0 - 0  Tired, decreased energy - 1 0 - 0  Change in appetite - 0 0 - 0  Feeling bad or failure about yourself  - 0 0 - 0  Trouble concentrating - 0 0 - 0  Moving slowly or fidgety/restless - 0 0 - 0  Suicidal thoughts - 0 0 - 0  PHQ-9 Score - 1 0 - 0     Advanced Directives Does patient have a HCPOA?    {Blank single:19197::"yes","no"} If yes, name and contact information:  Does patient have a living will or MOST form?  {Blank single:19197::"yes","no"}  Past Medical History:  Past Medical History:  Diagnosis Date  . Carpal tunnel syndrome   . Lichen sclerosus   . Pernicious anemia   . Shoulder bursitis     Surgical History:  Past Surgical History:  Procedure Laterality Date  . Molar Pregnancy     S/P D & C  . TONSILLECTOMY    . Tubes & Ovaries removed  2015    Medications:  Current Outpatient Medications on File Prior to Visit  Medication Sig  . cetirizine (ZYRTEC) 10 MG tablet Take 1 tablet (10 mg total) by mouth daily.  . Cyanocobalamin (VITAMIN B 12 PO) Take by mouth daily.  . fluticasone (FLONASE)  50 MCG/ACT nasal spray Place 2 sprays into both nostrils daily.  . ondansetron (ZOFRAN ODT) 4 MG disintegrating tablet Take 1 tablet (4 mg total) by mouth every 8 (eight) hours as needed.  . triamcinolone cream (KENALOG) 0.1 % 2 (two) times daily as needed  . Vitamin D, Ergocalciferol, (DRISDOL) 1.25 MG (50000 UNIT) CAPS capsule Take 1 capsule (50,000 Units total) by mouth every 7 (seven) days.   Current Facility-Administered Medications on File Prior to Visit  Medication  . cyanocobalamin ((VITAMIN B-12)) injection 1,000 mcg  . cyanocobalamin ((VITAMIN B-12)) injection 1,000 mcg    Allergies:  No Known Allergies  Social History:  Social History   Socioeconomic History  . Marital status: Widowed    Spouse name: Not on file  . Number of children: Not on file  . Years of education: Not on file  . Highest education level: Not on file  Occupational History  . Not on file  Tobacco Use  . Smoking status: Never Smoker  . Smokeless tobacco: Never Used  Vaping Use  . Vaping Use: Never used  Substance and Sexual Activity  . Alcohol use: No    Alcohol/week: 0.0 standard drinks  . Drug use: No  . Sexual activity:  Not Currently    Birth control/protection: Post-menopausal  Other Topics Concern  . Not on file  Social History Narrative   Works 32 hours a week at [employer] Strain:   . Difficulty of Paying Living Expenses: Not on file  Food Insecurity:   . Worried About Charity fundraiser in the Last Year: Not on file  . Ran Out of Food in the Last Year: Not on file  Transportation Needs:   . Lack of Transportation (Medical): Not on file  . Lack of Transportation (Non-Medical): Not on file  Physical Activity:   . Days of Exercise per Week: Not on file  . Minutes of Exercise per Session: Not on file  Stress:   . Feeling of Stress : Not on file  Social Connections:   . Frequency of Communication with Friends and Family: Not on  file  . Frequency of Social Gatherings with Friends and Family: Not on file  . Attends Religious Services: Not on file  . Active Member of Clubs or Organizations: Not on file  . Attends Archivist Meetings: Not on file  . Marital Status: Not on file  Intimate Partner Violence:   . Fear of Current or Ex-Partner: Not on file  . Emotionally Abused: Not on file  . Physically Abused: Not on file  . Sexually Abused: Not on file   Social History   Tobacco Use  Smoking Status Never Smoker  Smokeless Tobacco Never Used   Social History   Substance and Sexual Activity  Alcohol Use No  . Alcohol/week: 0.0 standard drinks    Family History:  Family History  Problem Relation Age of Onset  . Arthritis Mother   . Diabetes Mother   . Arthritis Father   . Diabetes Sister   . Stroke Sister   . Diabetes Sister   . Ovarian cancer Sister     Past medical history, surgical history, medications, allergies, family history and social history reviewed with patient today and changes made to appropriate areas of the chart.   ROS  All other ROS negative except what is listed above and in the HPI.      Objective:    There were no vitals taken for this visit.  Wt Readings from Last 3 Encounters:  07/05/20 102 lb (46.3 kg)  05/24/20 104 lb 8 oz (47.4 kg)  05/02/20 104 lb (47.2 kg)    Physical Exam  Cognitive Testing - 6-CIT  Correct? Score   What year is it? {YES NO:22349} {Numbers; 0-4:31231} Yes = 0    No = 4  What month is it? {YES NO:22349} {Numbers; 0-4:31231} Yes = 0    No = 3  Remember:     [address]ia [address], [place], [place]     What time is it? {YES NO:22349} {Numbers; 0-4:31231} Yes = 0    No = 3  Count backwards from 20 to 1 {YES NO:22349} {Numbers; 0-4:31231} Correct = 0    1 error = 2   More than 1 error = 4  Say the months of the year in reverse. {YES NO:22349} {Numbers; 0-4:31231} Correct = 0    1 error = 2   More than 1 error = 4  What address did I ask you to  remember? {YES NO:22349} {NUMBERS; 0-10:5044} Correct = 0  1 error = 2    2 error = 4    3 error =  6    4 error = 8    All wrong = 10       TOTAL SCORE  {Numbers; 5-40:08676}/19   Interpretation:  {Desc; normal/abnormal:11317::"Normal"}  Normal (0-7) Abnormal (8-28)   Results for orders placed or performed in visit on 07/05/20  CBC with Differential/Platelet  Result Value Ref Range   WBC 5.2 3.4 - 10.8 x10E3/uL   RBC 4.27 3.77 - 5.28 x10E6/uL   Hemoglobin 11.7 11.1 - 15.9 g/dL   MCV 81 79 - 97 fL   MCH 27.4 26.6 - 33.0 pg   MCHC 33.7 31 - 35 g/dL   RDW 16.6 (H) 11.7 - 15.4 %   Platelets 359 150 - 450 x10E3/uL   Neutrophils 61 Not Estab. %   Lymphs 30 Not Estab. %   Monocytes 7 Not Estab. %   Eos 1 Not Estab. %   Basos 1 Not Estab. %   Neutrophils Absolute 3.2 1 - 7 x10E3/uL   Lymphocytes Absolute 1.6 0 - 3 x10E3/uL   Monocytes Absolute 0.4 0 - 0 x10E3/uL   EOS (ABSOLUTE) 0.0 0.0 - 0.4 x10E3/uL   Basophils Absolute 0.0 0 - 0 x10E3/uL   Immature Granulocytes 0 Not Estab. %   Immature Grans (Abs) 0.0 0.0 - 0.1 x10E3/uL  Folate  Result Value Ref Range   Folate 14.6 >3.0 ng/mL  Anemia panel  Result Value Ref Range   Total Iron Binding Capacity 349 250 - 450 ug/dL   UIBC 308 118 - 369 ug/dL   Iron 41 27 - 139 ug/dL   Iron Saturation 12 (L) 15 - 55 %   Vitamin B-12 >2000 (H) 232 - 1245 pg/mL   Folate, Hemolysate 588.0 Not Estab. ng/mL   Hematocrit 34.7 34.0 - 46.6 %   Folate, RBC 1,695 >498 ng/mL   Ferritin 19 15.0 - 150.0 ng/mL   Retic Ct Pct 0.6 0.6 - 2.6 %      Assessment & Plan:   Problem List Items Addressed This Visit      Other   Pernicious anemia   Vitamin D deficiency    Other Visit Diagnoses    Encounter for annual wellness exam in Medicare patient    -  Primary   Routine general medical examination at a health care facility       Screening for cholera       Screening for cholesterol level       Fatigue, unspecified type           Preventative  Services:  AAA screening:  Health Risk Assessment and Personalized Prevention Plan: Bone Mass Measurements: Breast Cancer Screening: CVD Screening:  Cervical Cancer Screening: Colon Cancer Screening:  Depression Screening:  Diabetes Screening:  Glaucoma Screening:  Hepatitis B vaccine: Hepatitis C screening:  HIV Screening: Flu Vaccine: Lung cancer Screening: Obesity Screening:  Pneumonia Vaccines (2): STI Screening:  Follow up plan: No follow-ups on file.   LABORATORY TESTING:  - Pap smear: {Blank JKDTOI:71245::"YKD done","not applicable","up to date","done elsewhere"}  IMMUNIZATIONS:   - Tdap: Tetanus vaccination status reviewed: {tetanus status:315746}. - Influenza: {Blank single:19197::"Up to date","Administered today","Postponed to flu season","Refused","Given elsewhere"} - Pneumovax: {Blank single:19197::"Up to date","Administered today","Not applicable","Refused","Given elsewhere"} - Prevnar: {Blank single:19197::"Up to date","Administered today","Not applicable","Refused","Given elsewhere"} - Zostavax vaccine: {Blank single:19197::"Up to date","Administered today","Not applicable","Refused","Given elsewhere"}  SCREENING: -Mammogram: {Blank single:19197::"Up to date","Ordered today","Not applicable","Refused","Done elsewhere"}  - Colonoscopy: {Blank single:19197::"Up to date","Ordered today","Not applicable","Refused","Done elsewhere"}  - Bone Density: {Blank single:19197::"Up to date","Ordered today","Not applicable","Refused","Done elsewhere"}  -  Hearing Test: {Blank single:19197::"Up to date","Ordered today","Not applicable","Refused","Done elsewhere"}  -Spirometry: {Blank single:19197::"Up to date","Ordered today","Not applicable","Refused","Done elsewhere"}   PATIENT COUNSELING:   Advised to take 1 mg of folate supplement per day if capable of pregnancy.   Sexuality: Discussed sexually transmitted diseases, partner selection, use of condoms, avoidance of  unintended pregnancy  and contraceptive alternatives.   Advised to avoid cigarette smoking.  I discussed with the patient that most people either abstain from alcohol or drink within safe limits (<=14/week and <=4 drinks/occasion for males, <=7/weeks and <= 3 drinks/occasion for females) and that the risk for alcohol disorders and other health effects rises proportionally with the number of drinks per week and how often a drinker exceeds daily limits.  Discussed cessation/primary prevention of drug use and availability of treatment for abuse.   Diet: Encouraged to adjust caloric intake to maintain  or achieve ideal body weight, to reduce intake of dietary saturated fat and total fat, to limit sodium intake by avoiding high sodium foods and not adding table salt, and to maintain adequate dietary potassium and calcium preferably from fresh fruits, vegetables, and low-fat dairy products.    stressed the importance of regular exercise  Injury prevention: Discussed safety belts, safety helmets, smoke detector, smoking near bedding or upholstery.   Dental health: Discussed importance of regular tooth brushing, flossing, and dental visits.    NEXT PREVENTATIVE PHYSICAL DUE IN 1 YEAR. No follow-ups on file.

## 2020-08-08 ENCOUNTER — Telehealth: Payer: Self-pay | Admitting: Family Medicine

## 2020-08-08 NOTE — Telephone Encounter (Signed)
Copied from [place] [id number]. Topic: General - Inquiry >> Aug 08, 2020 10:09 AM Lennox Solders wrote: Reason for CRM:pt is calling to let dr Wynetta Emery know the [facility] will fax over LOA form to be completed

## 2020-08-08 NOTE — Telephone Encounter (Signed)
Awaiting fax to fill out for patient.

## 2020-08-13 ENCOUNTER — Other Ambulatory Visit: Payer: Medicare HMO

## 2020-08-13 ENCOUNTER — Other Ambulatory Visit: Payer: Self-pay

## 2020-08-13 DIAGNOSIS — R1013 Epigastric pain: Secondary | ICD-10-CM

## 2020-08-13 NOTE — Telephone Encounter (Signed)
Form still not received from Kayla Johnson. Called and let patient know. She is going to reach out to them and have the forms resent to [place].

## 2020-08-15 ENCOUNTER — Ambulatory Visit: Payer: Medicare HMO | Admitting: Podiatry

## 2020-08-15 LAB — FECAL OCCULT BLOOD, IMMUNOCHEMICAL: Fecal Occult Bld: NEGATIVE

## 2020-08-16 NOTE — Telephone Encounter (Signed)
Called and spoke with patient again as form has not been received yet. She states that when she called [employer] on Tuesday, they told her that it might be 2 business days before we received the form. She said that today would be the second day. Will wait to see if form comes today or in the morning and will call patient back to let her know.

## 2020-08-21 ENCOUNTER — Other Ambulatory Visit: Payer: Self-pay

## 2020-08-21 ENCOUNTER — Ambulatory Visit (INDEPENDENT_AMBULATORY_CARE_PROVIDER_SITE_OTHER): Payer: Medicare HMO | Admitting: Family Medicine

## 2020-08-21 ENCOUNTER — Encounter: Payer: Self-pay | Admitting: Family Medicine

## 2020-08-21 ENCOUNTER — Telehealth: Payer: Self-pay

## 2020-08-21 VITALS — BP 117/71 | HR 79 | Temp 98.2°F | Ht 61.2 in | Wt 102.0 lb

## 2020-08-21 DIAGNOSIS — R5383 Other fatigue: Secondary | ICD-10-CM | POA: Diagnosis not present

## 2020-08-21 DIAGNOSIS — H9313 Tinnitus, bilateral: Secondary | ICD-10-CM

## 2020-08-21 DIAGNOSIS — R1013 Epigastric pain: Secondary | ICD-10-CM | POA: Diagnosis not present

## 2020-08-21 DIAGNOSIS — D692 Other nonthrombocytopenic purpura: Secondary | ICD-10-CM | POA: Diagnosis not present

## 2020-08-21 DIAGNOSIS — E7800 Pure hypercholesterolemia, unspecified: Secondary | ICD-10-CM

## 2020-08-21 DIAGNOSIS — Z0000 Encounter for general adult medical examination without abnormal findings: Secondary | ICD-10-CM | POA: Diagnosis not present

## 2020-08-21 DIAGNOSIS — Z1159 Encounter for screening for other viral diseases: Secondary | ICD-10-CM

## 2020-08-21 DIAGNOSIS — E559 Vitamin D deficiency, unspecified: Secondary | ICD-10-CM | POA: Diagnosis not present

## 2020-08-21 LAB — HM HEPATITIS C SCREENING LAB: HM Hepatitis Screen: NEGATIVE

## 2020-08-21 NOTE — Progress Notes (Signed)
BP 117/71   Pulse 79   Temp 98.2 F (36.8 C) (Oral)   Ht 5' 1.2" (1.554 m)   Wt 102 lb (46.3 kg)   SpO2 95%   BMI 19.15 kg/m    Subjective:    Patient ID: Kayla Johnson, female    DOB: 1946-08-20, 74 y.o.   MRN: [id number]  HPI: Kayla Johnson is a 74 y.o. female presenting on 08/21/2020 for comprehensive medical examination. Current medical complaints include:  Seen 2 weeks ago for epigastric pain. Started on carafate and omeprazole. To have EGD in 3 weeks. FOBT negative. She is feeling much better.   Menopausal Symptoms: no  Depression Screen done today and results listed below:  Depression screen [facility] 2/9 08/21/2020 08/07/2020 07/05/2020 06/15/2019 04/14/2018  Decreased Interest 0 0 0 0 0  Down, Depressed, Hopeless 0 0 0 0 0  PHQ - 2 Score 0 0 0 0 0  Altered sleeping 0 0 - 0 0  Tired, decreased energy 3 3 - 1 0  Change in appetite 0 3 - 0 0  Feeling bad or failure about yourself  0 0 - 0 0  Trouble concentrating 0 0 - 0 0  Moving slowly or fidgety/restless 0 0 - 0 0  Suicidal thoughts 0 0 - 0 0  PHQ-9 Score 3 6 - 1 0  Difficult doing work/chores Not difficult at all Not difficult at all - - -   Past Medical History:  Past Medical History:  Diagnosis Date  . Carpal tunnel syndrome   . Lichen sclerosus   . Pernicious anemia   . Shoulder bursitis     Surgical History:  Past Surgical History:  Procedure Laterality Date  . Molar Pregnancy     S/P D & C  . TONSILLECTOMY    . Tubes & Ovaries removed  2015    Medications:  Current Outpatient Medications on File Prior to Visit  Medication Sig  . omeprazole (PRILOSEC) 20 MG capsule Take 1 capsule (20 mg total) by mouth daily.  . ondansetron (ZOFRAN ODT) 4 MG disintegrating tablet Take 1 tablet (4 mg total) by mouth every 8 (eight) hours as needed.  . Simethicone (GAS-X PO) Take by mouth.  . sucralfate (CARAFATE) 1 g tablet Take 1 tablet (1 g total) by mouth 4 (four) times daily -  with meals and at bedtime.  .  triamcinolone cream (KENALOG) 0.1 % 2 (two) times daily as needed  . Vitamin D, Ergocalciferol, (DRISDOL) 1.25 MG (50000 UNIT) CAPS capsule Take 1 capsule (50,000 Units total) by mouth every 7 (seven) days.  . cetirizine (ZYRTEC) 10 MG tablet Take 1 tablet (10 mg total) by mouth daily. (Patient not taking: Reported on 08/07/2020)  . Cyanocobalamin (VITAMIN B 12 PO) Take by mouth daily. (Patient not taking: Reported on 08/07/2020)  . fluticasone (FLONASE) 50 MCG/ACT nasal spray Place 2 sprays into both nostrils daily. (Patient not taking: Reported on 08/07/2020)   Current Facility-Administered Medications on File Prior to Visit  Medication  . cyanocobalamin ((VITAMIN B-12)) injection 1,000 mcg  . cyanocobalamin ((VITAMIN B-12)) injection 1,000 mcg    Allergies:  No Known Allergies  Social History:  Social History   Socioeconomic History  . Marital status: Widowed    Spouse name: Not on file  . Number of children: Not on file  . Years of education: Not on file  . Highest education level: Not on file  Occupational History  . Not on file  Tobacco Use  .  Smoking status: Never Smoker  . Smokeless tobacco: Never Used  Vaping Use  . Vaping Use: Never used  Substance and Sexual Activity  . Alcohol use: No    Alcohol/week: 0.0 standard drinks  . Drug use: No  . Sexual activity: Not Currently    Birth control/protection: Post-menopausal  Other Topics Concern  . Not on file  Social History Narrative   Works 32 hours a week at [employer] Strain:   . Difficulty of Paying Living Expenses: Not on file  Food Insecurity:   . Worried About Charity fundraiser in the Last Year: Not on file  . Ran Out of Food in the Last Year: Not on file  Transportation Needs:   . Lack of Transportation (Medical): Not on file  . Lack of Transportation (Non-Medical): Not on file  Physical Activity:   . Days of Exercise per Week: Not on file  . Minutes of  Exercise per Session: Not on file  Stress:   . Feeling of Stress : Not on file  Social Connections:   . Frequency of Communication with Friends and Family: Not on file  . Frequency of Social Gatherings with Friends and Family: Not on file  . Attends Religious Services: Not on file  . Active Member of Clubs or Organizations: Not on file  . Attends Archivist Meetings: Not on file  . Marital Status: Not on file  Intimate Partner Violence:   . Fear of Current or Ex-Partner: Not on file  . Emotionally Abused: Not on file  . Physically Abused: Not on file  . Sexually Abused: Not on file   Social History   Tobacco Use  Smoking Status Never Smoker  Smokeless Tobacco Never Used   Social History   Substance and Sexual Activity  Alcohol Use No  . Alcohol/week: 0.0 standard drinks    Family History:  Family History  Problem Relation Age of Onset  . Arthritis Mother   . Diabetes Mother   . Arthritis Father   . Diabetes Sister   . Stroke Sister   . Diabetes Sister   . Ovarian cancer Sister     Past medical history, surgical history, medications, allergies, family history and social history reviewed with patient today and changes made to appropriate areas of the chart.   Review of Systems  Constitutional: Positive for malaise/fatigue. Negative for chills, diaphoresis, fever and weight loss.  HENT: Positive for tinnitus. Negative for congestion, ear discharge, ear pain, hearing loss, nosebleeds, sinus pain and sore throat.   Eyes: Negative.   Respiratory: Positive for cough. Negative for hemoptysis, sputum production, shortness of breath, wheezing and stridor.   Cardiovascular: Positive for palpitations and leg swelling. Negative for chest pain, orthopnea, claudication and PND.  Gastrointestinal: Positive for abdominal pain and heartburn. Negative for blood in stool, constipation, diarrhea, melena, nausea and vomiting.  Genitourinary: Negative.   Musculoskeletal:  Negative.   Skin: Negative.   Neurological: Negative.   Endo/Heme/Allergies: Negative for environmental allergies and polydipsia. Bruises/bleeds easily.  Psychiatric/Behavioral: Negative.     All other ROS negative except what is listed above and in the HPI.      Objective:    BP 117/71   Pulse 79   Temp 98.2 F (36.8 C) (Oral)   Ht 5' 1.2" (1.554 m)   Wt 102 lb (46.3 kg)   SpO2 95%   BMI 19.15 kg/m   Wt Readings from Last  3 Encounters:  08/21/20 102 lb (46.3 kg)  08/07/20 101 lb (45.8 kg)  07/05/20 102 lb (46.3 kg)    Physical Exam Vitals and nursing note reviewed.  Constitutional:      General: She is not in acute distress.    Appearance: Normal appearance. She is not ill-appearing, toxic-appearing or diaphoretic.  HENT:     Head: Normocephalic and atraumatic.     Right Ear: Tympanic membrane, ear canal and external ear normal. There is no impacted cerumen.     Left Ear: Tympanic membrane, ear canal and external ear normal. There is no impacted cerumen.     Nose: Nose normal. No congestion or rhinorrhea.     Mouth/Throat:     Mouth: Mucous membranes are moist.     Pharynx: Oropharynx is clear. No oropharyngeal exudate or posterior oropharyngeal erythema.  Eyes:     General: No scleral icterus.       Right eye: No discharge.        Left eye: No discharge.     Extraocular Movements: Extraocular movements intact.     Conjunctiva/sclera: Conjunctivae normal.     Pupils: Pupils are equal, round, and reactive to light.  Neck:     Vascular: No carotid bruit.  Cardiovascular:     Rate and Rhythm: Normal rate and regular rhythm.     Pulses: Normal pulses.     Heart sounds: No murmur heard.  No friction rub. No gallop.   Pulmonary:     Effort: Pulmonary effort is normal. No respiratory distress.     Breath sounds: Normal breath sounds. No stridor. No wheezing, rhonchi or rales.  Chest:     Chest Yoo: No tenderness.  Abdominal:     General: Abdomen is flat. Bowel  sounds are normal. There is no distension.     Palpations: Abdomen is soft. There is no mass.     Tenderness: There is no abdominal tenderness. There is no right CVA tenderness, left CVA tenderness, guarding or rebound.     Hernia: No hernia is present.  Genitourinary:    Comments: Breast and pelvic exams deferred with shared decision making Musculoskeletal:        General: No swelling, tenderness, deformity or signs of injury.     Cervical back: Normal range of motion and neck supple. No rigidity. No muscular tenderness.     Right lower leg: No edema.     Left lower leg: No edema.  Lymphadenopathy:     Cervical: No cervical adenopathy.  Skin:    General: Skin is warm and dry.     Capillary Refill: Capillary refill takes less than 2 seconds.     Coloration: Skin is not jaundiced or pale.     Findings: No bruising, erythema, lesion or rash.  Neurological:     General: No focal deficit present.     Mental Status: She is alert and oriented to person, place, and time. Mental status is at baseline.     Cranial Nerves: No cranial nerve deficit.     Sensory: No sensory deficit.     Motor: No weakness.     Coordination: Coordination normal.     Gait: Gait normal.     Deep Tendon Reflexes: Reflexes normal.  Psychiatric:        Mood and Affect: Mood normal.        Behavior: Behavior normal.        Thought Content: Thought content normal.  Judgment: Judgment normal.     Results for orders placed or performed in visit on 08/13/20  Fecal occult blood, imunochemical([facility])   Specimen: Stool   ST  Result Value Ref Range   Fecal Occult Bld Negative Negative      Assessment & Plan:   Problem List Items Addressed This Visit      Other   Vitamin D deficiency    Checking labs. Await results.       Epigastric pain    Significantly better. Continue carafate and omeprazole until her EGD. Out of work until 09/24/20. Call with any concerns.       Relevant Orders   CBC  with Differential/Platelet   Comprehensive metabolic panel   UA/M w/rflx Culture, Routine    Other Visit Diagnoses    Routine general medical examination at a health care facility    -  Primary   Vaccines declined. Screening labs checked today. DEXA up to date. Colonoscopy and pap and mammo N/A. Call with any concerns. Continue diet and exercise.    Hypercholesteremia       Checking labs. Await results.    Relevant Orders   Comprehensive metabolic panel   Lipid Panel w/o Chol/HDL Ratio   Fatigue, unspecified type       Checking labs. Await results.    Relevant Orders   CBC with Differential/Platelet   Comprehensive metabolic panel   TSH   Need for hepatitis C screening test       Checking labs. Await results.    Relevant Orders   Hepatitis C Antibody   Tinnitus of both ears       Would like to see ENT. Referral generated today.   Relevant Orders   Ambulatory referral to ENT   Senile purpura ([place])       Reassured patient. Call with any concerns.        Follow up plan: Return in about 6 months (around 02/18/2021).   LABORATORY TESTING:  - Pap smear: not applicable  IMMUNIZATIONS:   - Tdap: Tetanus vaccination status reviewed: last tetanus booster within 10 years. - Influenza: Refused - Pneumovax: Refused - Prevnar: Up to date - COVID: Refused  SCREENING: -Mammogram: Ordered today  - Colonoscopy: Not applicable  - Bone Density: Up to date   PATIENT COUNSELING:   Advised to take 1 mg of folate supplement per day if capable of pregnancy.   Sexuality: Discussed sexually transmitted diseases, partner selection, use of condoms, avoidance of unintended pregnancy  and contraceptive alternatives.   Advised to avoid cigarette smoking.  I discussed with the patient that most people either abstain from alcohol or drink within safe limits (<=14/week and <=4 drinks/occasion for males, <=7/weeks and <= 3 drinks/occasion for females) and that the risk for alcohol disorders and  other health effects rises proportionally with the number of drinks per week and how often a drinker exceeds daily limits.  Discussed cessation/primary prevention of drug use and availability of treatment for abuse.   Diet: Encouraged to adjust caloric intake to maintain  or achieve ideal body weight, to reduce intake of dietary saturated fat and total fat, to limit sodium intake by avoiding high sodium foods and not adding table salt, and to maintain adequate dietary potassium and calcium preferably from fresh fruits, vegetables, and low-fat dairy products.    stressed the importance of regular exercise  Injury prevention: Discussed safety belts, safety helmets, smoke detector, smoking near bedding or upholstery.   Dental health: Discussed importance of  regular tooth brushing, flossing, and dental visits.    NEXT PREVENTATIVE PHYSICAL DUE IN 1 YEAR. Return in about 6 months (around 02/18/2021).

## 2020-08-21 NOTE — Telephone Encounter (Signed)
FMLA paperwork completed and faxed back. Called and let patient know this was done for her.

## 2020-08-21 NOTE — Assessment & Plan Note (Signed)
Significantly better. Continue carafate and omeprazole until her EGD. Out of work until 09/24/20. Call with any concerns.

## 2020-08-21 NOTE — Assessment & Plan Note (Signed)
Checking labs. Await results.  

## 2020-08-22 LAB — CBC WITH DIFFERENTIAL/PLATELET
Basophils Absolute: 0 10*3/uL (ref 0.0–0.2)
Basos: 1 %
EOS (ABSOLUTE): 0 10*3/uL (ref 0.0–0.4)
Eos: 0 %
Hematocrit: 35.5 % (ref 34.0–46.6)
Hemoglobin: 11.7 g/dL (ref 11.1–15.9)
Immature Grans (Abs): 0 10*3/uL (ref 0.0–0.1)
Immature Granulocytes: 0 %
Lymphocytes Absolute: 1.5 10*3/uL (ref 0.7–3.1)
Lymphs: 20 %
MCH: 26.8 pg (ref 26.6–33.0)
MCHC: 33 g/dL (ref 31.5–35.7)
MCV: 81 fL (ref 79–97)
Monocytes Absolute: 0.4 10*3/uL (ref 0.1–0.9)
Monocytes: 5 %
Neutrophils Absolute: 5.5 10*3/uL (ref 1.4–7.0)
Neutrophils: 74 %
Platelets: 299 10*3/uL (ref 150–450)
RBC: 4.37 x10E6/uL (ref 3.77–5.28)
RDW: 16.3 % — ABNORMAL HIGH (ref 11.7–15.4)
WBC: 7.6 10*3/uL (ref 3.4–10.8)

## 2020-08-22 LAB — COMPREHENSIVE METABOLIC PANEL
ALT: 11 IU/L (ref 0–32)
AST: 17 IU/L (ref 0–40)
Albumin/Globulin Ratio: 2.2 (ref 1.2–2.2)
Albumin: 4.1 g/dL (ref 3.7–4.7)
Alkaline Phosphatase: 80 IU/L (ref 44–121)
BUN/Creatinine Ratio: 12 (ref 12–28)
BUN: 8 mg/dL (ref 8–27)
Bilirubin Total: 0.3 mg/dL (ref 0.0–1.2)
CO2: 28 mmol/L (ref 20–29)
Calcium: 8.8 mg/dL (ref 8.7–10.3)
Chloride: 102 mmol/L (ref 96–106)
Creatinine, Ser: 0.66 mg/dL (ref 0.57–1.00)
GFR calc Af Amer: 99 mL/min/{1.73_m2} (ref >59)
GFR calc non Af Amer: 86 mL/min/{1.73_m2} (ref >59)
Globulin, Total: 1.9 g/dL (ref 1.5–4.5)
Glucose: 113 mg/dL — ABNORMAL HIGH (ref 65–99)
Potassium: 3.9 mmol/L (ref 3.5–5.2)
Sodium: 141 mmol/L (ref 134–144)
Total Protein: 6 g/dL (ref 6.0–8.5)

## 2020-08-22 LAB — HEPATITIS C ANTIBODY: Hep C Virus Ab: <0.1 s/co ratio (ref 0.0–0.9)

## 2020-08-22 LAB — LIPID PANEL W/O CHOL/HDL RATIO
Cholesterol, Total: 197 mg/dL (ref 100–199)
HDL: 53 mg/dL (ref >39)
LDL Chol Calc (NIH): 124 mg/dL — ABNORMAL HIGH (ref 0–99)
Triglycerides: 109 mg/dL (ref 0–149)
VLDL Cholesterol Cal: 20 mg/dL (ref 5–40)

## 2020-08-22 LAB — TSH: TSH: 1.62 u[IU]/mL (ref 0.450–4.500)

## 2020-08-23 LAB — UA/M W/RFLX CULTURE, ROUTINE
Bilirubin, UA: NEGATIVE
Glucose, UA: NEGATIVE
Ketones, UA: NEGATIVE
Nitrite, UA: NEGATIVE
Protein,UA: NEGATIVE
RBC, UA: NEGATIVE
Specific Gravity, UA: 1.02 (ref 1.005–1.030)
Urobilinogen, Ur: 1 mg/dL (ref 0.2–1.0)
pH, UA: 5.5 (ref 5.0–7.5)

## 2020-08-23 LAB — MICROSCOPIC EXAMINATION
Bacteria, UA: NONE SEEN
RBC, Urine: NONE SEEN /hpf (ref 0–2)

## 2020-08-23 LAB — URINE CULTURE, REFLEX

## 2020-08-29 ENCOUNTER — Telehealth: Payer: Self-pay | Admitting: Family Medicine

## 2020-08-29 NOTE — Telephone Encounter (Signed)
Pt bought in FMLA forms that need to be completed and has had apt for this . Forms left in bin Pt would like them faxed and would like a copy to take to work.Call back # [phone]

## 2020-08-30 ENCOUNTER — Telehealth: Payer: Self-pay | Admitting: Family Medicine

## 2020-08-30 NOTE — Telephone Encounter (Signed)
Form filled out and awaiting provider signature.

## 2020-08-30 NOTE — Telephone Encounter (Signed)
Copied from [place] [id number]. Topic: Medicare AWV >> Aug 30, 2020 12:42 PM Cher Nakai R wrote: Reason for CRM:  Left message for patient to call back and schedule Medicare Annual Wellness Visit (AWV) to be done virtually.  No hx of AWV 04/14/2018  Please schedule at anytime with [facility]-Nurse Health Advisor.      64 Minutes appointment   Any questions, please call me at [phone]

## 2020-08-31 NOTE — Telephone Encounter (Signed)
Form signed and faxed back. Copies made for the patient and for [place] to scan.   Called and notified patient that this was done for her.

## 2020-09-10 ENCOUNTER — Other Ambulatory Visit: Payer: Self-pay

## 2020-09-10 ENCOUNTER — Other Ambulatory Visit
Admission: RE | Admit: 2020-09-10 | Discharge: 2020-09-10 | Disposition: A | Discharge status: Home / Self Care | Payer: Medicare HMO | Source: Ambulatory Visit | Attending: Internal Medicine | Admitting: Internal Medicine

## 2020-09-10 DIAGNOSIS — Z01812 Encounter for preprocedural laboratory examination: Secondary | ICD-10-CM | POA: Insufficient documentation

## 2020-09-10 DIAGNOSIS — Z20822 Contact with and (suspected) exposure to covid-19: Secondary | ICD-10-CM | POA: Insufficient documentation

## 2020-09-10 LAB — SARS CORONAVIRUS 2 (TAT 6-24 HRS): SARS Coronavirus 2: NEGATIVE

## 2020-09-11 ENCOUNTER — Encounter: Payer: Self-pay | Admitting: Internal Medicine

## 2020-09-12 ENCOUNTER — Ambulatory Visit: Payer: Medicare HMO | Admitting: Anesthesiology

## 2020-09-12 ENCOUNTER — Ambulatory Visit
Admission: RE | Admit: 2020-09-12 | Discharge: 2020-09-12 | Disposition: A | Discharge status: Home / Self Care | Payer: Medicare HMO | Attending: Internal Medicine | Admitting: Internal Medicine

## 2020-09-12 ENCOUNTER — Encounter
Admission: RE | Disposition: A | Discharge status: Home / Self Care | Payer: Self-pay | Source: Home / Self Care | Attending: Internal Medicine

## 2020-09-12 ENCOUNTER — Encounter: Payer: Self-pay | Admitting: Internal Medicine

## 2020-09-12 DIAGNOSIS — D649 Anemia, unspecified: Secondary | ICD-10-CM | POA: Insufficient documentation

## 2020-09-12 DIAGNOSIS — K200 Eosinophilic esophagitis: Secondary | ICD-10-CM | POA: Diagnosis not present

## 2020-09-12 DIAGNOSIS — K219 Gastro-esophageal reflux disease without esophagitis: Secondary | ICD-10-CM | POA: Diagnosis not present

## 2020-09-12 DIAGNOSIS — K2900 Acute gastritis without bleeding: Secondary | ICD-10-CM | POA: Diagnosis not present

## 2020-09-12 DIAGNOSIS — R1013 Epigastric pain: Secondary | ICD-10-CM | POA: Diagnosis not present

## 2020-09-12 DIAGNOSIS — R0789 Other chest pain: Secondary | ICD-10-CM | POA: Diagnosis not present

## 2020-09-12 DIAGNOSIS — Z79899 Other long term (current) drug therapy: Secondary | ICD-10-CM | POA: Diagnosis not present

## 2020-09-12 DIAGNOSIS — K2970 Gastritis, unspecified, without bleeding: Secondary | ICD-10-CM | POA: Insufficient documentation

## 2020-09-12 DIAGNOSIS — K2289 Other specified disease of esophagus: Secondary | ICD-10-CM | POA: Insufficient documentation

## 2020-09-12 DIAGNOSIS — K2100 Gastro-esophageal reflux disease with esophagitis, without bleeding: Secondary | ICD-10-CM | POA: Diagnosis not present

## 2020-09-12 HISTORY — DX: Gastro-esophageal reflux disease without esophagitis: K21.9

## 2020-09-12 HISTORY — PX: ESOPHAGOGASTRODUODENOSCOPY (EGD) WITH PROPOFOL: SHX5813

## 2020-09-12 SURGERY — ESOPHAGOGASTRODUODENOSCOPY (EGD) WITH PROPOFOL
Anesthesia: General

## 2020-09-12 MED ORDER — FLUMAZENIL 0.5 MG/5ML IV SOLN
INTRAVENOUS | Status: DC | PRN
  Administered 2020-09-12: .2 mg via INTRAVENOUS

## 2020-09-12 MED ORDER — SODIUM CHLORIDE 0.9 % IV SOLN: INTRAVENOUS | Status: DC

## 2020-09-12 MED ORDER — LIDOCAINE HCL (PF) 2 % IJ SOLN
INTRAMUSCULAR | Status: DC | PRN
  Administered 2020-09-12: 100 mg via INTRADERMAL

## 2020-09-12 MED ORDER — PROPOFOL 10 MG/ML IV BOLUS
INTRAVENOUS | Status: DC | PRN
  Administered 2020-09-12: 40 mg via INTRAVENOUS

## 2020-09-12 MED ORDER — MIDAZOLAM HCL 2 MG/2ML IJ SOLN
INTRAMUSCULAR | Status: AC
  Filled 2020-09-12: qty 2

## 2020-09-12 MED ORDER — MIDAZOLAM HCL 2 MG/2ML IJ SOLN
INTRAMUSCULAR | Status: DC | PRN
  Administered 2020-09-12: 2 mg via INTRAVENOUS

## 2020-09-12 NOTE — Transfer of Care (Signed)
Immediate Anesthesia Transfer of Care Note  Patient: Kayla Johnson  Procedure(s) Performed: ESOPHAGOGASTRODUODENOSCOPY (EGD) WITH PROPOFOL (N/A )  Patient Location: PACU  Anesthesia Type:General  Level of Consciousness: sedated  Airway & Oxygen Therapy: Patient Spontanous Breathing and Patient connected to nasal cannula oxygen  Post-op Assessment: Report given to RN and Post -op Vital signs reviewed and stable  Post vital signs: Reviewed and stable  Last Vitals:  Vitals Value Taken Time  BP    Temp    Pulse    Resp    SpO2      Last Pain:  Vitals:   09/12/20 1047  TempSrc: Temporal  PainSc: 0-No pain         Complications: No complications documented.

## 2020-09-12 NOTE — Anesthesia Postprocedure Evaluation (Signed)
Anesthesia Post Note  Patient: Kayla Johnson  Procedure(s) Performed: ESOPHAGOGASTRODUODENOSCOPY (EGD) WITH PROPOFOL (N/A )  Patient location during evaluation: Endoscopy Anesthesia Type: General Level of consciousness: awake and alert Pain management: pain level controlled Vital Signs Assessment: post-procedure vital signs reviewed and stable Respiratory status: spontaneous breathing and respiratory function stable Cardiovascular status: stable Anesthetic complications: no   No complications documented.   Last Vitals:  Vitals:   09/12/20 1250 09/12/20 1300  BP: (!) 146/68 (!) 153/72  Pulse: 97 94  Resp: 20 18  Temp:    SpO2: 94% 93%    Last Pain:  Vitals:   09/12/20 1300  TempSrc:   PainSc: 0-No pain                 Millee Denise K

## 2020-09-12 NOTE — Op Note (Signed)
[facility] Gastroenterology Patient Name: Kayla Johnson Procedure Date: 09/12/2020 11:58 AM MRN: [id number] Account #: [id number] Date of Birth: 10-20-1946 Admit Type: Outpatient Age: 74 Room: [facility] ENDO ROOM 3 Gender: Female Note Status: Finalized Procedure:             Upper GI endoscopy Indications:           Epigastric abdominal pain, Unexplained chest pain Providers:             Benay Pike. Alice Reichert MD, MD Referring MD:          Valerie Roys (Referring MD) Medicines:             Propofol per Anesthesia Complications:         No immediate complications. Procedure:             Pre-Anesthesia Assessment:                        - The risks and benefits of the procedure and the                         sedation options and risks were discussed with the                         patient. All questions were answered and informed                         consent was obtained.                        - Patient identification and proposed procedure were                         verified prior to the procedure by the nurse. The                         procedure was verified in the procedure room.                        - ASA Grade Assessment: III - A patient with severe                         systemic disease.                        - After reviewing the risks and benefits, the patient                         was deemed in satisfactory condition to undergo the                         procedure.                        After obtaining informed consent, the endoscope was                         passed under direct vision. Throughout the procedure,                         the patient's  blood pressure, pulse, and oxygen                         saturations were monitored continuously. The Endoscope                         was introduced through the mouth, and advanced to the                         third part of duodenum. The upper GI endoscopy was                          accomplished without difficulty. The patient tolerated                         the procedure well. Findings:      Mucosal changes including feline appearance were found in the entire       esophagus. Biopsies were obtained from the proximal and distal esophagus       with cold forceps for histology of suspected eosinophilic esophagitis.      Patchy mild inflammation characterized by erythema was found in the       gastric antrum.      The examined duodenum was normal.      The exam was otherwise without abnormality. Impression:            - Esophageal mucosal changes suggestive of                         eosinophilic esophagitis. Biopsied.                        - Gastritis.                        - Normal examined duodenum.                        - The examination was otherwise normal. Recommendation:        - Patient has a contact number available for                         emergencies. The signs and symptoms of potential                         delayed complications were discussed with the patient.                         Return to normal activities tomorrow. Written                         discharge instructions were provided to the patient.                        - Resume previous diet.                        - Continue present medications.                        - Await pathology results.                        -  Return to GI office in 2 months.                        - The findings and recommendations were discussed with                         the patient. Procedure Code(s):     --- Professional ---                        [id number], Esophagogastroduodenoscopy, flexible,                         transoral; with biopsy, single or multiple Diagnosis Code(s):     --- Professional ---                        R07.9, Chest pain, unspecified                        R10.13, Epigastric pain                        K29.70, Gastritis, unspecified, without bleeding                        K22.8,  Other specified diseases of esophagus CPT copyright 2019 American Medical Association. All rights reserved. The codes documented in this report are preliminary and upon coder review may  be revised to meet current compliance requirements. Efrain Sella MD, MD 09/12/2020 12:31:22 PM This report has been signed electronically. Number of Addenda: 0 Note Initiated On: 09/12/2020 11:58 AM Estimated Blood Loss:  Estimated blood loss: none.      [facility]

## 2020-09-12 NOTE — Interval H&P Note (Signed)
History and Physical Interval Note:  09/12/2020 12:12 PM  Kayla Johnson  has presented today for surgery, with the diagnosis of GERD.  The various methods of treatment have been discussed with the patient and family. After consideration of risks, benefits and other options for treatment, the patient has consented to  Procedure(s): ESOPHAGOGASTRODUODENOSCOPY (EGD) WITH PROPOFOL (N/A) as a surgical intervention.  The patient's history has been reviewed, patient examined, no change in status, stable for surgery.  I have reviewed the patient's chart and labs.  Questions were answered to the patient's satisfaction.     [place], [name]

## 2020-09-12 NOTE — Anesthesia Preprocedure Evaluation (Addendum)
Anesthesia Evaluation  Patient identified by MRN, date of birth, ID band Patient awake    Reviewed: Allergy & Precautions, NPO status , Patient's Chart, lab work & pertinent test results  History of Anesthesia Complications Negative for: history of anesthetic complications  Airway Mallampati: II       Dental   Pulmonary neg sleep apnea, neg COPD, Not current smoker,           Cardiovascular (-) hypertension(-) Past MI and (-) CHF (-) dysrhythmias (-) Valvular Problems/Murmurs     Neuro/Psych neg Seizures    GI/Hepatic Neg liver ROS, GERD  Medicated and Controlled,  Endo/Other  neg diabetes  Renal/GU negative Renal ROS     Musculoskeletal   Abdominal   Peds  Hematology  (+) anemia ,   Anesthesia Other Findings   Reproductive/Obstetrics                            Anesthesia Physical Anesthesia Plan  ASA: II  Anesthesia Plan: General   Post-op Pain Management:    Induction: Intravenous  PONV Risk Score and Plan: 3 and Propofol infusion, TIVA and Treatment may vary due to age or medical condition  Airway Management Planned: Nasal Cannula  Additional Equipment:   Intra-op Plan:   Post-operative Plan:   Informed Consent: I have reviewed the patients History and Physical, chart, labs and discussed the procedure including the risks, benefits and alternatives for the proposed anesthesia with the patient or authorized representative who has indicated his/her understanding and acceptance.       Plan Discussed with:   Anesthesia Plan Comments:         Anesthesia Quick Evaluation

## 2020-09-12 NOTE — H&P (Signed)
Outpatient short stay form Pre-procedure 09/12/2020 12:10 PM Kayla Johnson K. Alice Reichert, M.D.  Primary Physician: Park Liter, D.O.  Reason for visit:  Chest pressure, abdominal pain in epigastrium, nausea  History of present illness:  Patient with chest pressure, nausea and epigastric and RLQ pain and cramping relieved by Omeprazole.     Current Facility-Administered Medications:    0.9 %  sodium chloride infusion, , Intravenous, Continuous, [place], Benay Pike, MD, Last Rate: 20 mL/hr at 09/12/20 1112, New Bag at 09/12/20 1112  Facility-Administered Medications Prior to Admission  Medication Dose Route Frequency Provider Last Rate Last Admin   [DISCONTINUED] cyanocobalamin ((VITAMIN B-12)) injection 1,000 mcg  1,000 mcg Intramuscular Q30 days Kathrine Haddock, NP   1,000 mcg at 07/16/17 1135   [DISCONTINUED] cyanocobalamin ((VITAMIN B-12)) injection 1,000 mcg  1,000 mcg Intramuscular Q30 days Kathrine Haddock, NP   1,000 mcg at 11/18/17 1345   Medications Prior to Admission  Medication Sig Dispense Refill Last Dose   cetirizine (ZYRTEC) 10 MG tablet Take 1 tablet (10 mg total) by mouth daily. 90 tablet 1 Past Week at Unknown time   Cyanocobalamin (VITAMIN B 12 PO) Take by mouth daily.    Past Week at Unknown time   fluticasone (FLONASE) 50 MCG/ACT nasal spray Place 2 sprays into both nostrils daily. 16 g 2 Past Week at Unknown time   omeprazole (PRILOSEC) 20 MG capsule Take 1 capsule (20 mg total) by mouth daily. 30 capsule 3 Past Week at Unknown time   Simethicone (GAS-X PO) Take by mouth.   Past Week at Unknown time   sucralfate (CARAFATE) 1 g tablet Take 1 tablet (1 g total) by mouth 4 (four) times daily -  with meals and at bedtime. 120 tablet 3 Past Week at Unknown time   Vitamin D, Ergocalciferol, (DRISDOL) 1.25 MG (50000 UNIT) CAPS capsule Take 1 capsule (50,000 Units total) by mouth every 7 (seven) days. 12 capsule 3 Past Week at Unknown time   ondansetron (ZOFRAN ODT) 4 MG  disintegrating tablet Take 1 tablet (4 mg total) by mouth every 8 (eight) hours as needed. 20 tablet 0    triamcinolone cream (KENALOG) 0.1 % 2 (two) times daily as needed 90 g 1      No Known Allergies   Past Medical History:  Diagnosis Date   Carpal tunnel syndrome    GERD (gastroesophageal reflux disease)    Lichen sclerosus    Pernicious anemia    Shoulder bursitis     Review of systems:  Otherwise negative.    Physical Exam  Gen: Alert, oriented. Appears stated age.  HEENT: [place]/AT. PERRLA. Lungs: CTA, no wheezes. CV: RR nl S1, S2. Abd: soft, benign, no masses. BS+ Ext: No edema. Pulses 2+    Planned procedures: Proceed with EGD. The patient understands the nature of the planned procedure, indications, risks, alternatives and potential complications including but not limited to bleeding, infection, perforation, damage to internal organs and possible oversedation/side effects from anesthesia. The patient agrees and gives consent to proceed.  Please refer to procedure notes for findings, recommendations and patient disposition/instructions.     Harm Jou K. Alice Reichert, M.D. Gastroenterology 09/12/2020  12:10 PM

## 2020-09-13 ENCOUNTER — Encounter: Payer: Self-pay | Admitting: Internal Medicine

## 2020-09-13 LAB — SURGICAL PATHOLOGY

## 2020-09-14 DIAGNOSIS — J301 Allergic rhinitis due to pollen: Secondary | ICD-10-CM | POA: Diagnosis not present

## 2020-09-14 DIAGNOSIS — H903 Sensorineural hearing loss, bilateral: Secondary | ICD-10-CM | POA: Diagnosis not present

## 2020-09-14 DIAGNOSIS — H6983 Other specified disorders of Eustachian tube, bilateral: Secondary | ICD-10-CM | POA: Diagnosis not present

## 2020-09-14 DIAGNOSIS — H9313 Tinnitus, bilateral: Secondary | ICD-10-CM | POA: Diagnosis not present

## 2020-09-26 ENCOUNTER — Other Ambulatory Visit: Payer: Self-pay | Admitting: Family Medicine

## 2020-09-26 NOTE — Telephone Encounter (Signed)
ondansetron (ZOFRAN ODT) 4 MG disintegrating tablet Medication Date: 05/25/2020 Department: [facility] Ordering/Authorizing: [place], [place], [place], [place] Phone:  [phone]  Fax:  [phone]     Pt is getting ready to go back to work and these really helped her on her job.wants refill, sine 9/21

## 2020-09-27 MED ORDER — ONDANSETRON 4 MG PO TBDP
4.0000 mg | ORAL_TABLET | Freq: Three times a day (TID) | ORAL | 0 refills | Status: DC | PRN
Start: 2020-09-27 — End: 2020-12-06

## 2020-10-03 ENCOUNTER — Encounter: Payer: Self-pay | Admitting: Podiatry

## 2020-10-03 ENCOUNTER — Other Ambulatory Visit: Payer: Self-pay

## 2020-10-03 ENCOUNTER — Ambulatory Visit: Payer: Medicare HMO | Admitting: Podiatry

## 2020-10-03 DIAGNOSIS — Q828 Other specified congenital malformations of skin: Secondary | ICD-10-CM

## 2020-10-03 DIAGNOSIS — M778 Other enthesopathies, not elsewhere classified: Secondary | ICD-10-CM

## 2020-10-03 NOTE — Progress Notes (Signed)
She presents today with a chief complaint of pain to the second metatarsophalangeal joint of the left foot.  She states is been bothering her for quite some time.  Objective: Vital signs are stable alert oriented x3 there is no erythema edema cellulitis drainage or odor mild callus on palpation beneath the second metatarsophalangeal joint but she has pain on end range of motion of the second metatarsophalangeal joint and palpation of the dorsal lateral ligament of the capsule.  Assessment: Capsulitis.  Plan: Injected around the second metatarsophalangeal joint with 10 mg of Kenalog and 5 mg Marcaine tolerated procedure well without complications.

## 2020-12-06 ENCOUNTER — Other Ambulatory Visit: Payer: Self-pay | Admitting: Family Medicine

## 2020-12-06 DIAGNOSIS — U071 COVID-19: Secondary | ICD-10-CM

## 2020-12-06 DIAGNOSIS — Z7189 Other specified counseling: Secondary | ICD-10-CM | POA: Diagnosis not present

## 2020-12-06 DIAGNOSIS — Z03818 Encounter for observation for suspected exposure to other biological agents ruled out: Secondary | ICD-10-CM | POA: Diagnosis not present

## 2020-12-06 HISTORY — DX: COVID-19: U07.1

## 2020-12-06 MED ORDER — OMEPRAZOLE 20 MG PO CPDR
20.0000 mg | DELAYED_RELEASE_CAPSULE | Freq: Every day | ORAL | 3 refills | Status: DC
Start: 2020-12-06 — End: 2021-02-20

## 2020-12-06 NOTE — Telephone Encounter (Signed)
Medication Refill - Medication: Ondansetron, omeprazole , cyclobenzaprine   Has the patient contacted their pharmacy? Yes.   (Agent: If no, request that the patient contact the pharmacy for the refill.) (Agent: If yes, when and what did the pharmacy advise?)  Preferred Pharmacy (with phone number or street name):  [facility] [address] [place] - [address]  [address] [place] [address]  Phone: [phone] Fax: [phone]  Hours: Not open 24 hours     Agent: Please be advised that RX refills may take up to 3 business days. We ask that you follow-up with your pharmacy.

## 2020-12-06 NOTE — Telephone Encounter (Signed)
Requested medication (s) are due for refill today: no  Requested medication (s) are on the active medication list: yes  Last refill:  09/27/2020  Future visit scheduled: yes  Notes to clinic:  This refill cannot be delegated Patient also requesting  cyclobenzaprine   Requested Prescriptions  Pending Prescriptions Disp Refills   ondansetron (ZOFRAN ODT) 4 MG disintegrating tablet 20 tablet 0    Sig: Take 1 tablet (4 mg total) by mouth every 8 (eight) hours as needed.      Not Delegated - Gastroenterology: Antiemetics Failed - 12/06/2020  9:51 AM      Failed - This refill cannot be delegated      Passed - Valid encounter within last 6 months    Recent Outpatient Visits           3 months ago Routine general medical examination at a health care facility   [facility], Megan P, DO   4 months ago Epigastric pain   [facility] [place], Megan P, DO   5 months ago Allergic rhinitis, unspecified seasonality, unspecified trigger   [facility] Valentino Nose, NP   7 months ago Shoulder impingement syndrome, right   [facility] Roosvelt Maser [place], [place]   11 months ago Hand numbness   [facility], [place], [place]       Future Appointments             In 2 months Johnson, Megan P, DO [facility], [facility]              Signed Prescriptions Disp Refills   omeprazole (PRILOSEC) 20 MG capsule 30 capsule 3    Sig: Take 1 capsule (20 mg total) by mouth daily.      Gastroenterology: Proton Pump Inhibitors Passed - 12/06/2020  9:51 AM      Passed - Valid encounter within last 12 months    Recent Outpatient Visits           3 months ago Routine general medical examination at a health care facility   [facility] [place], [place] P, DO   4 months ago Epigastric pain   [facility] [place], Megan P, DO   5 months ago Allergic rhinitis, unspecified seasonality,  unspecified trigger   [facility] Valentino Nose, NP   7 months ago Shoulder impingement syndrome, right   [facility] Roosvelt Maser [place], [place]   11 months ago Hand numbness   [facility], Salley Hews, [place]       Future Appointments             In 2 months Laural Benes, Oralia Rud, DO [facility], [facility]

## 2020-12-10 ENCOUNTER — Other Ambulatory Visit: Payer: Self-pay | Admitting: Family Medicine

## 2020-12-10 MED ORDER — TRIAMCINOLONE ACETONIDE 0.1 % EX CREA: TOPICAL_CREAM | CUTANEOUS | 0 refills | Status: DC

## 2020-12-10 MED ORDER — ONDANSETRON 4 MG PO TBDP
4.0000 mg | ORAL_TABLET | Freq: Three times a day (TID) | ORAL | 0 refills | Status: DC | PRN
Start: 2020-12-10 — End: 2021-02-20

## 2020-12-10 NOTE — Telephone Encounter (Signed)
Medication: triamcinolone cream (KENALOG) 0.1 % [[id number]]   Has the patient contacted their pharmacy? YES  (Agent: If no, request that the patient contact the pharmacy for the refill.) (Agent: If yes, when and what did the pharmacy advise?)  Preferred Pharmacy (with phone number or street name): [address]  Agent: Please be advised that RX refills m ay take up to 3 business days. We ask that you follow-up with your pharmacy.

## 2020-12-11 ENCOUNTER — Encounter: Payer: Self-pay | Admitting: Emergency Medicine

## 2020-12-11 ENCOUNTER — Emergency Department
Admission: EM | Admit: 2020-12-11 | Discharge: 2020-12-12 | Disposition: A | Discharge status: Home / Self Care | Payer: Medicare HMO | Source: Home / Self Care | Attending: Emergency Medicine | Admitting: Emergency Medicine

## 2020-12-11 ENCOUNTER — Emergency Department: Payer: Medicare HMO

## 2020-12-11 ENCOUNTER — Emergency Department
Admission: EM | Admit: 2020-12-11 | Discharge: 2020-12-11 | Disposition: A | Discharge status: Home / Self Care | Payer: Medicare HMO | Attending: Emergency Medicine | Admitting: Emergency Medicine

## 2020-12-11 ENCOUNTER — Other Ambulatory Visit: Payer: Self-pay

## 2020-12-11 DIAGNOSIS — R109 Unspecified abdominal pain: Secondary | ICD-10-CM | POA: Diagnosis not present

## 2020-12-11 DIAGNOSIS — K2971 Gastritis, unspecified, with bleeding: Secondary | ICD-10-CM | POA: Insufficient documentation

## 2020-12-11 DIAGNOSIS — R0789 Other chest pain: Secondary | ICD-10-CM | POA: Diagnosis not present

## 2020-12-11 DIAGNOSIS — K219 Gastro-esophageal reflux disease without esophagitis: Secondary | ICD-10-CM | POA: Insufficient documentation

## 2020-12-11 DIAGNOSIS — Z8616 Personal history of COVID-19: Secondary | ICD-10-CM | POA: Insufficient documentation

## 2020-12-11 DIAGNOSIS — U071 COVID-19: Secondary | ICD-10-CM | POA: Insufficient documentation

## 2020-12-11 DIAGNOSIS — R0602 Shortness of breath: Secondary | ICD-10-CM | POA: Insufficient documentation

## 2020-12-11 DIAGNOSIS — R1010 Upper abdominal pain, unspecified: Secondary | ICD-10-CM | POA: Diagnosis present

## 2020-12-11 DIAGNOSIS — R0981 Nasal congestion: Secondary | ICD-10-CM | POA: Diagnosis not present

## 2020-12-11 DIAGNOSIS — K2970 Gastritis, unspecified, without bleeding: Secondary | ICD-10-CM

## 2020-12-11 LAB — BASIC METABOLIC PANEL
Anion gap: 11 (ref 5–15)
BUN: 10 mg/dL (ref 8–23)
CO2: 27 mmol/L (ref 22–32)
Calcium: 8.9 mg/dL (ref 8.9–10.3)
Chloride: 99 mmol/L (ref 98–111)
Creatinine, Ser: 0.68 mg/dL (ref 0.44–1.00)
GFR, Estimated: >60 mL/min (ref >60)
Glucose, Bld: 130 mg/dL — ABNORMAL HIGH (ref 70–99)
Potassium: 4.2 mmol/L (ref 3.5–5.1)
Sodium: 137 mmol/L (ref 135–145)

## 2020-12-11 LAB — CBC
HCT: 37.8 % (ref 36.0–46.0)
Hemoglobin: 12.1 g/dL (ref 12.0–15.0)
MCH: 26.1 pg (ref 26.0–34.0)
MCHC: 32 g/dL (ref 30.0–36.0)
MCV: 81.6 fL (ref 80.0–100.0)
Platelets: 266 10*3/uL (ref 150–400)
RBC: 4.63 MIL/uL (ref 3.87–5.11)
RDW: 15.8 % — ABNORMAL HIGH (ref 11.5–15.5)
WBC: 5.9 10*3/uL (ref 4.0–10.5)
nRBC: 0 % (ref 0.0–0.2)

## 2020-12-11 LAB — LIPASE, BLOOD: Lipase: 34 U/L (ref 11–51)

## 2020-12-11 LAB — TROPONIN I (HIGH SENSITIVITY): Troponin I (High Sensitivity): 4 ng/L (ref <18)

## 2020-12-11 MED ORDER — ONDANSETRON 4 MG PO TBDP
4.0000 mg | ORAL_TABLET | Freq: Once | ORAL | Status: AC
  Administered 2020-12-11: 4 mg via ORAL
  Filled 2020-12-11: qty 1

## 2020-12-11 MED ORDER — PANTOPRAZOLE SODIUM 40 MG IV SOLR
40.0000 mg | Freq: Once | INTRAVENOUS | Status: AC
  Administered 2020-12-12: 40 mg via INTRAVENOUS
  Filled 2020-12-11: qty 40

## 2020-12-11 MED ORDER — IOHEXOL 300 MG/ML  SOLN
75.0000 mL | Freq: Once | INTRAMUSCULAR | Status: AC | PRN
  Administered 2020-12-11: 75 mL via INTRAVENOUS

## 2020-12-11 MED ORDER — SUCRALFATE 1 G PO TABS: 1.0000 g | ORAL_TABLET | Freq: Four times a day (QID) | ORAL | 0 refills | Status: DC

## 2020-12-11 MED ORDER — ALUM & MAG HYDROXIDE-SIMETH 200-200-20 MG/5ML PO SUSP
30.0000 mL | Freq: Once | ORAL | Status: AC
  Administered 2020-12-11: 30 mL via ORAL
  Filled 2020-12-11: qty 30

## 2020-12-11 NOTE — ED Triage Notes (Signed)
Pt to ED from home c/o upper abd pain and nauseous.  States was here earlier and seen dx with gastritis.  States pain to abd is worse, has not vomited since leaving.  Pt was tested positive for COVID last Thursday.  Updated with Dr. Archie Balboa who saw pt earlier, placing order for CT, no additional blood work at this time.

## 2020-12-11 NOTE — ED Triage Notes (Signed)
Pt to ED POV from home for chief complaint of lower abdominal pain that started today, pt COVID + Reports chest tightness that started last Thursday +N/V RR even and unlabored, speaking in complete sentences , ambulatory to triage

## 2020-12-11 NOTE — Discharge Instructions (Addendum)
Please seek medical attention for any high fevers, chest pain, shortness of breath, change in behavior, persistent vomiting, bloody stool or any other new or concerning symptoms.  

## 2020-12-11 NOTE — ED Provider Notes (Signed)
[facility] Emergency Department Provider Note   ____________________________________________   I have reviewed the triage vital signs and the nursing notes.   HISTORY  Chief Complaint Abdominal pain  History limited by: Not Limited   HPI Kayla Johnson is a 75 y.o. female who presents to the emergency department today because of concern for abdominal pain.  Patient states the pain is located across her upper abdomen.  The patient says that the pain started today.  She rates it 5 out of 10.  She was diagnosed with COVID recently.  She states that she has been having some congestion and shortness of breath with her COVID diagnosis.  She is supposed be on omeprazole but states that she has not taken over the past few days since she was diagnosed with COVID.  Patient denies any fevers.  Denies any bloody or black or tarry stool. She has had associated nausea.   Records reviewed. Per medical record review patient has a history of GERD.  Past Medical History:  Diagnosis Date  . Carpal tunnel syndrome   . GERD (gastroesophageal reflux disease)   . Lichen sclerosus   . Pernicious anemia   . Shoulder bursitis     Patient Active Problem List   Diagnosis Date Noted  . Vitamin D deficiency 08/07/2020  . Epigastric pain 08/07/2020  . Advanced care planning/counseling discussion 04/14/2018  . Shoulder impingement syndrome, right 03/03/2018  . Lichen sclerosus of female genitalia 12/31/2016  . Bilateral tinnitus 12/12/2015  . Allergic rhinitis 12/12/2015  . Pernicious anemia 10/22/2015    Past Surgical History:  Procedure Laterality Date  . ABDOMINAL HYSTERECTOMY    . ESOPHAGOGASTRODUODENOSCOPY (EGD) WITH PROPOFOL N/A 09/12/2020   Procedure: ESOPHAGOGASTRODUODENOSCOPY (EGD) WITH PROPOFOL;  Surgeon: [place], Benay Pike, MD;  Location: [facility] ENDOSCOPY;  Service: Gastroenterology;  Laterality: N/A;  . Molar Pregnancy     S/P D & C  . TONSILLECTOMY    . Tubes &  Ovaries removed  2015    Prior to Admission medications   Medication Sig Start Date End Date Taking? Authorizing Provider  cetirizine (ZYRTEC) 10 MG tablet Take 1 tablet (10 mg total) by mouth daily. 07/05/20   Eulogio Bear, NP  Cyanocobalamin (VITAMIN B 12 PO) Take by mouth daily.     [provider]  fluticasone (FLONASE) 50 MCG/ACT nasal spray Place 2 sprays into both nostrils daily. 07/05/20   Eulogio Bear, NP  omeprazole (PRILOSEC) 20 MG capsule Take 1 capsule (20 mg total) by mouth daily. 12/06/20   Johnson, Megan P, DO  ondansetron (ZOFRAN ODT) 4 MG disintegrating tablet Take 1 tablet (4 mg total) by mouth every 8 (eight) hours as needed. 12/10/20   Johnson, Megan P, DO  Simethicone (GAS-X PO) Take by mouth.    [provider]  sucralfate (CARAFATE) 1 g tablet Take 1 tablet (1 g total) by mouth 4 (four) times daily -  with meals and at bedtime. 08/07/20   Park Liter P, DO  triamcinolone (KENALOG) 0.1 % 2 (two) times daily as needed 12/10/20   Park Liter P, DO  Vitamin D, Ergocalciferol, (DRISDOL) 1.25 MG (50000 UNIT) CAPS capsule Take 1 capsule (50,000 Units total) by mouth every 7 (seven) days. 05/02/20   Volney American, PA-C    Allergies Lidocaine  Family History  Problem Relation Age of Onset  . Arthritis Mother   . Diabetes Mother   . Arthritis Father   . Diabetes Sister   . Stroke  Sister   . Diabetes Sister   . Ovarian cancer Sister     Social History Social History   Tobacco Use  . Smoking status: Never Smoker  . Smokeless tobacco: Never Used  Vaping Use  . Vaping Use: Never used  Substance Use Topics  . Alcohol use: No    Alcohol/week: 0.0 standard drinks  . Drug use: No    Review of Systems Constitutional: No fever/chills Eyes: No visual changes. ENT: Positive for congestion. Cardiovascular: Positive for chest pain. Respiratory: Positive for shortness of breath. Gastrointestinal: Positive for abdominal pain, nausea.   Genitourinary: Negative for dysuria. Musculoskeletal: Negative for back pain. Skin: Negative for rash. Neurological: Negative for headaches, focal weakness or numbness.  ____________________________________________   PHYSICAL EXAM:  VITAL SIGNS: ED Triage Vitals  Enc Vitals Group     BP 12/11/20 1615 (!) 141/68     Pulse Rate 12/11/20 1615 (!) 120     Resp 12/11/20 1615 20     Temp 12/11/20 1615 98.2 F (36.8 C)     Temp Source 12/11/20 1615 Oral     SpO2 12/11/20 1615 93 %     Weight 12/11/20 1618 107 lb (48.5 kg)     Height 12/11/20 1618 5\' 2"  (1.575 m)     Head Circumference --      Peak Flow --      Pain Score 12/11/20 1617 6   Constitutional: Alert and oriented.  Eyes: Conjunctivae are normal.  ENT      Head: Normocephalic and atraumatic.      Nose: No congestion/rhinnorhea.      Mouth/Throat: Mucous membranes are moist.      Neck: No stridor. Hematological/Lymphatic/Immunilogical: No cervical lymphadenopathy. Cardiovascular: Normal rate, regular rhythm.  No murmurs, rubs, or gallops.  Respiratory: Normal respiratory effort without tachypnea nor retractions. Breath sounds are clear and equal bilaterally. No wheezes/rales/rhonchi. Gastrointestinal: Soft and tender to palpation in the upper abdomen.  Genitourinary: Deferred Musculoskeletal: Normal range of motion in all extremities. No lower extremity edema. Neurologic:  Normal speech and language. No gross focal neurologic deficits are appreciated.  Skin:  Skin is warm, dry and intact. No rash noted. Psychiatric: Mood and affect are normal. Speech and behavior are normal. Patient exhibits appropriate insight and judgment.  ____________________________________________    LABS (pertinent positives/negatives)  Lipase 34 BMP wnl except glu 130 Trop hs 4 CBC wbc 5.9, hgb 12.1, plt 266  ____________________________________________   EKG  I, Nance Pear, attending physician, personally viewed and  interpreted this EKG  EKG Time: 1622 Rate: 99 Rhythm: normal sinus rhythm Axis: normal Intervals: qtc 418 QRS: narrow, q waves v1 ST changes: no st elevation Impression: abnormal ekg  ____________________________________________    RADIOLOGY  CXR Chronic lung changes without acute abnormality  ____________________________________________   PROCEDURES  Procedures  ____________________________________________   INITIAL IMPRESSION / ASSESSMENT AND PLAN / ED COURSE  Pertinent labs & imaging results that were available during my care of the patient were reviewed by me and considered in my medical decision making (see chart for details).   Patient presented to the emergency department today with complaint of abdominal pain in the setting of known covid diagnosis. The patient was afebrile here in the emergency department. Physical exam with some tenderness in the upper abdomen. Blood work without leukocytosis. Lipase normal. She does have history of GERD and has not been taking her antacid. Was given maalox which did help her symptoms, pain improved. At this time I think likely  patient's abdominal pain is secondary to gastritis. Discussed with patient, will discharge with sucralfate and instructed patient to resume antacids. Doubt significant intraabdominal infection, doubt ischemic disease.   ____________________________________________   FINAL CLINICAL IMPRESSION(S) / ED DIAGNOSES  Final diagnoses:  Gastritis, presence of bleeding unspecified, unspecified chronicity, unspecified gastritis type     Note: This dictation was prepared with Dragon dictation. Any transcriptional errors that result from this process are unintentional     Nance Pear, MD 12/11/20 2112

## 2020-12-12 ENCOUNTER — Encounter: Payer: Self-pay | Admitting: Emergency Medicine

## 2020-12-12 NOTE — ED Provider Notes (Signed)
[facility] Emergency Department Provider Note  ____________________________________________   Event Date/Time   First MD Initiated Contact with Patient 12/11/20 2305     (approximate)  I have reviewed the triage vital signs and the nursing notes.   HISTORY  Chief Complaint Abdominal Pain    HPI Kayla Johnson is a 75 y.o. female who presents for persistent upper and central abdominal pain.  She has been seen by Dr. Alice Reichert in the past and had an upper endoscopy a few months ago.  She has been taking Prilosec but stopped taking it about 5 days ago when she got her COVID diagnosis; she says she does not know what she was thinking but she just stopped taking it.    Since this morning she has been having gnawing, aching and severe abdominal pain but nothing particular seems to make better or worse including eating.  She has had nausea but no recent vomiting.  She was seen in the emergency department earlier today and diagnosed with gastritis and given a prescription for sucralfate but the pain was persistent tonight so she came back for further evaluation.  She denies fever, chest pain, shortness of breath.        Past Medical History:  Diagnosis Date  . Carpal tunnel syndrome   . COVID-19 12/06/2020   reported by patient  . GERD (gastroesophageal reflux disease)   . Lichen sclerosus   . Pernicious anemia   . Shoulder bursitis     Patient Active Problem List   Diagnosis Date Noted  . Vitamin D deficiency 08/07/2020  . Epigastric pain 08/07/2020  . Advanced care planning/counseling discussion 04/14/2018  . Shoulder impingement syndrome, right 03/03/2018  . Lichen sclerosus of female genitalia 12/31/2016  . Bilateral tinnitus 12/12/2015  . Allergic rhinitis 12/12/2015  . Pernicious anemia 10/22/2015    Past Surgical History:  Procedure Laterality Date  . ABDOMINAL HYSTERECTOMY    . ESOPHAGOGASTRODUODENOSCOPY (EGD) WITH PROPOFOL N/A 09/12/2020    Procedure: ESOPHAGOGASTRODUODENOSCOPY (EGD) WITH PROPOFOL;  Surgeon: Toledo, Benay Pike, MD;  Location: [facility] ENDOSCOPY;  Service: Gastroenterology;  Laterality: N/A;  . Molar Pregnancy     S/P D & C  . TONSILLECTOMY    . Tubes & Ovaries removed  2015    Prior to Admission medications   Medication Sig Start Date End Date Taking? Authorizing Provider  cetirizine (ZYRTEC) 10 MG tablet Take 1 tablet (10 mg total) by mouth daily. 07/05/20   Eulogio Bear, NP  Cyanocobalamin (VITAMIN B 12 PO) Take by mouth daily.     [provider]  fluticasone (FLONASE) 50 MCG/ACT nasal spray Place 2 sprays into both nostrils daily. 07/05/20   Eulogio Bear, NP  omeprazole (PRILOSEC) 20 MG capsule Take 1 capsule (20 mg total) by mouth daily. 12/06/20   Johnson, Megan P, DO  ondansetron (ZOFRAN ODT) 4 MG disintegrating tablet Take 1 tablet (4 mg total) by mouth every 8 (eight) hours as needed. 12/10/20   Johnson, Megan P, DO  Simethicone (GAS-X PO) Take by mouth.    [provider]  sucralfate (CARAFATE) 1 g tablet Take 1 tablet (1 g total) by mouth 4 (four) times daily -  with meals and at bedtime. 08/07/20   Johnson, Megan P, DO  sucralfate (CARAFATE) 1 g tablet Take 1 tablet (1 g total) by mouth 4 (four) times daily. 12/11/20   Nance Pear, MD  triamcinolone (KENALOG) 0.1 % 2 (two) times daily as needed 12/10/20  Johnson, Megan P, DO  Vitamin D, Ergocalciferol, (DRISDOL) 1.25 MG (50000 UNIT) CAPS capsule Take 1 capsule (50,000 Units total) by mouth every 7 (seven) days. 05/02/20   Volney American, PA-C    Allergies Lidocaine  Family History  Problem Relation Age of Onset  . Arthritis Mother   . Diabetes Mother   . Arthritis Father   . Diabetes Sister   . Stroke Sister   . Diabetes Sister   . Ovarian cancer Sister     Social History Social History   Tobacco Use  . Smoking status: Never Smoker  . Smokeless tobacco: Never Used  Vaping Use  . Vaping Use: Never used   Substance Use Topics  . Alcohol use: No    Alcohol/week: 0.0 standard drinks  . Drug use: No    Review of Systems Constitutional: No fever/chills Eyes: No visual changes. ENT: No sore throat. Cardiovascular: Denies chest pain. Respiratory: Denies shortness of breath. Gastrointestinal: Abdominal pain as described above with nausea but no vomiting at this time. Genitourinary: Negative for dysuria. Musculoskeletal: Negative for neck pain.  Negative for back pain. Integumentary: Negative for rash. Neurological: Negative for headaches, focal weakness or numbness.   ____________________________________________   PHYSICAL EXAM:  VITAL SIGNS: ED Triage Vitals [12/11/20 2023]  Enc Vitals Group     BP (!) 147/76     Pulse Rate 95     Resp 16     Temp 98.2 F (36.8 C)     Temp Source Oral     SpO2 99 %     Weight 48.5 kg (106 lb 14.8 oz)     Height 1.575 m (5\' 2" )     Head Circumference      Peak Flow      Pain Score 6     Pain Loc      Pain Edu?      Excl. in [place]?     Constitutional: Alert and oriented.  Eyes: Conjunctivae are normal.  Head: Atraumatic. Nose: Mild congestion/rhinnorhea. Mouth/Throat: Patient is wearing a mask. Neck: No stridor.  No meningeal signs.   Cardiovascular: Normal rate, regular rhythm. Good peripheral circulation. Respiratory: Normal respiratory effort.  No retractions. Gastrointestinal: Soft and nondistended.  Mild nonfocal tenderness to palpation abdomen but no focal peritonitis and no rebound or guarding. Musculoskeletal: No lower extremity tenderness nor edema. No gross deformities of extremities. Neurologic:  Normal speech and language. No gross focal neurologic deficits are appreciated.  Skin:  Skin is warm, dry and intact. Psychiatric: Mood and affect are normal. Speech and behavior are normal.  ____________________________________________   LABS (all labs ordered are listed, but only abnormal results are displayed)  Labs Reviewed  - No data to display ____________________________________________  EKG  No indication for emergent EKG ____________________________________________  RADIOLOGY I, Hinda Kehr, personally viewed and evaluated these images (plain radiographs) as part of my medical decision making, as well as reviewing the written report by the radiologist.  ED MD interpretation: Some pyloric thickening but no acute abnormalities on CT scan, suggestive of possible peptic ulcer disease but otherwise reassuring.  Official radiology report(s):   CT ABDOMEN PELVIS W CONTRAST  Result Date: 12/11/2020 CLINICAL DATA:  Upper abdominal pain EXAM: CT ABDOMEN AND PELVIS WITH CONTRAST TECHNIQUE: Multidetector CT imaging of the abdomen and pelvis was performed using the standard protocol following bolus administration of intravenous contrast. CONTRAST:  30mL OMNIPAQUE IOHEXOL 300 MG/ML  SOLN COMPARISON:  CT 01/28/2020 FINDINGS: Lower chest: Lung bases demonstrate no acute  consolidation or effusion. Hepatobiliary: Stable subcentimeter hypodense lesion within the left hepatic lobe, felt benign given lack of interval change. Gallstones. No biliary dilatation Pancreas: Unremarkable. No pancreatic ductal dilatation or surrounding inflammatory changes. Spleen: Normal in size without focal abnormality. Adrenals/Urinary Tract: Adrenal glands are normal. Kidneys show no hydronephrosis. The bladder is unremarkable. Stomach/Bowel: The stomach is nonenlarged. Slightly thickened appearance of the pylorus. No dilated small bowel. Negative appendix. No small or large bowel [place] thickening. Vascular/Lymphatic: Mild aortic atherosclerosis. No aneurysm. No suspicious nodes. Reproductive: Uterus and bilateral adnexa are unremarkable. Other: Negative for free air or free fluid. Musculoskeletal: No acute or significant osseous findings. IMPRESSION: 1. Possible mild Diebel thickening of the pylorus, question peptic ulcer disease. Otherwise no CT evidence  for acute intra-abdominal or pelvic abnormality. 2. Gallstones. Aortic Atherosclerosis (ICD10-I70.0). Electronically Signed   By: Donavan Foil M.D.   On: 12/11/2020 21:24    ____________________________________________   PROCEDURES   Procedure(s) performed (including Critical Care):  Procedures   ____________________________________________   INITIAL IMPRESSION / MDM / [place] / ED COURSE  As part of my medical decision making, I reviewed the following data within the [place] notes reviewed and incorporated, Labs reviewed , Old chart reviewed and Notes from prior ED visits and reviewed [place] controlled substance database.   Differential diagnosis includes, but is not limited to, acid reflux, peptic ulcer disease, perforation, biliary colic, choledocholithiasis, cholecystitis.  Patient's lab work from her previous visit was reassuring.  She has no focal tenderness to palpation and no rebound or guarding; no localized peritonitis.  CT scan is suggestive of peptic ulcer disease and she says that she already knew that she had gastritis.  She has some gallstones but no radiographic evidence of cholecystitis and no physical exam findings that would suggest cholecystitis.  I think she is mostly having symptoms because she stopped her PPI within the last week after getting her COVID-19 diagnosis.  I gave her a dose of pantoprazole 40 mg IV and encouraged her to resume her usual medications tomorrow as well as taking the medications prescribed previously by Dr. Archie Balboa.  I encouraged her to follow-up with Dr. Alice Reichert at the next available opportunity and she understands and agrees with the plan.  No evidence of emergent medical condition requiring further evaluation or treatment.           ____________________________________________  FINAL CLINICAL IMPRESSION(S) / ED DIAGNOSES  Final diagnoses:  Gastroesophageal reflux disease, unspecified  whether esophagitis present  Gastritis, presence of bleeding unspecified, unspecified chronicity, unspecified gastritis type     MEDICATIONS GIVEN DURING THIS VISIT:  Medications  iohexol (OMNIPAQUE) 300 MG/ML solution 75 mL (75 mLs Intravenous Contrast Given 12/11/20 2107)  pantoprazole (PROTONIX) injection 40 mg (40 mg Intravenous Given 12/12/20 0003)     ED Discharge Orders    None      *Please note:  Kayla Johnson was evaluated in Emergency Department on 12/12/2020 for the symptoms described in the history of present illness. She was evaluated in the context of the global COVID-19 pandemic, which necessitated consideration that the patient might be at risk for infection with the SARS-CoV-2 virus that causes COVID-19. Institutional protocols and algorithms that pertain to the evaluation of patients at risk for COVID-19 are in a state of rapid change based on information released by regulatory bodies including the CDC and federal and state organizations. These policies and algorithms were followed during the patient's care in the ED.  Some ED evaluations and interventions may be delayed as a result of limited staffing during and after the pandemic.*  Note:  This document was prepared using Dragon voice recognition software and may include unintentional dictation errors.   Hinda Kehr, MD 12/12/20 [id number]

## 2020-12-12 NOTE — Discharge Instructions (Addendum)
Please go back to taking your Prilosec starting tomorrow (Wednesday) as you were taking previously.  Also talk with your pharmacist about how to coordinate the timing of your Prilosec with the sucralfate you were prescribed previously so that both medications work as effectively as possible.  Continue to take your other previously prescribed medications as well and try to stay hydrated with fluids.  Return to the emergency department if you develop new or worsening symptoms that concern you.

## 2020-12-25 ENCOUNTER — Other Ambulatory Visit: Payer: Self-pay

## 2020-12-25 ENCOUNTER — Encounter: Payer: Self-pay | Admitting: Family Medicine

## 2020-12-25 ENCOUNTER — Ambulatory Visit (INDEPENDENT_AMBULATORY_CARE_PROVIDER_SITE_OTHER): Payer: Medicare HMO | Admitting: Family Medicine

## 2020-12-25 VITALS — BP 146/75 | HR 89 | Temp 97.9°F | Wt 106.8 lb

## 2020-12-25 DIAGNOSIS — R0981 Nasal congestion: Secondary | ICD-10-CM | POA: Diagnosis not present

## 2020-12-25 DIAGNOSIS — B370 Candidal stomatitis: Secondary | ICD-10-CM

## 2020-12-25 DIAGNOSIS — Z8616 Personal history of COVID-19: Secondary | ICD-10-CM | POA: Diagnosis not present

## 2020-12-25 MED ORDER — NYSTATIN 100000 UNIT/ML MT SUSP: 5.0000 mL | Freq: Four times a day (QID) | OROMUCOSAL | 0 refills | Status: DC

## 2020-12-25 MED ORDER — PREDNISONE 10 MG PO TABS: ORAL_TABLET | ORAL | 0 refills | Status: DC

## 2020-12-25 MED ORDER — BACLOFEN 10 MG PO TABS: 5.0000 mg | ORAL_TABLET | Freq: Every day | ORAL | 0 refills | Status: DC

## 2020-12-25 NOTE — Progress Notes (Signed)
BP (!) 146/75   Pulse 89   Temp 97.9 F (36.6 C)   Wt 106 lb 12.8 oz (48.4 kg)   SpO2 98%   BMI 19.53 kg/m    Subjective:    Patient ID: Kayla Johnson, female    DOB: 08-Jun-1946, 75 y.o.   MRN: [id number]  HPI: Kayla Johnson is a 75 y.o. female  Chief Complaint  Patient presents with  . post covid    Patient states she tested positive for covid on jan 6. Patient states she feels lingering effects, is very fatigue, has headaches behind her eyes. Itchy throat and congested nose. Patient states she has been having muscle spasms in her legs and arms would like a muscle relaxer.    She feels like she has not gotten better from her COVID. She feels like her tongue is burning. She continues with congestion and sore throat.  Worst symptom: congestion and sore throat.  Fever: no Cough: yes- rarely Shortness of breath: no Wheezing: no Chest pain: no Chest tightness: yes Chest congestion: no Nasal congestion: yes Runny nose: no Post nasal drip: yes Sneezing: yes Sore throat: yes Swollen glands: no Sinus pressure: yes Headache: no Face pain: no Toothache: no Ear pain: no  Ear pressure: no  Eyes red/itching:no Eye drainage/crusting: no  Vomiting: no Rash: no Fatigue: yes Sick contacts: no Strep contacts: no  Context: better Recurrent sinusitis: no Relief with OTC cold/cough medications: no  Treatments attempted: prednisone before, nothing now    Relevant past medical, surgical, family and social history reviewed and updated as indicated. Interim medical history since our last visit reviewed. Allergies and medications reviewed and updated.  Review of Systems  Constitutional: Positive for fatigue. Negative for activity change, appetite change, chills, diaphoresis, fever and unexpected weight change.  HENT: Positive for congestion, rhinorrhea and sinus pressure. Negative for dental problem, drooling, ear discharge, ear pain, facial swelling, hearing loss, mouth sores,  nosebleeds, postnasal drip, sinus pain, sneezing, sore throat, tinnitus, trouble swallowing and voice change.   Respiratory: Positive for cough. Negative for apnea, choking, chest tightness, shortness of breath, wheezing and stridor.   Cardiovascular: Negative.   Musculoskeletal: Negative.   Psychiatric/Behavioral: Negative.     Per HPI unless specifically indicated above     Objective:    BP (!) 146/75   Pulse 89   Temp 97.9 F (36.6 C)   Wt 106 lb 12.8 oz (48.4 kg)   SpO2 98%   BMI 19.53 kg/m   Wt Readings from Last 3 Encounters:  12/25/20 106 lb 12.8 oz (48.4 kg)  12/11/20 106 lb 14.8 oz (48.5 kg)  12/11/20 107 lb (48.5 kg)    Physical Exam Vitals and nursing note reviewed.  Constitutional:      General: She is not in acute distress.    Appearance: Normal appearance. She is normal weight. She is not ill-appearing, toxic-appearing or diaphoretic.  HENT:     Head: Normocephalic and atraumatic.     Right Ear: Tympanic membrane, ear canal and external ear normal.     Left Ear: Tympanic membrane, ear canal and external ear normal.     Nose: Nose normal. No congestion or rhinorrhea.     Mouth/Throat:     Mouth: Mucous membranes are moist.     Pharynx: Oropharynx is clear. No oropharyngeal exudate or posterior oropharyngeal erythema.     Comments: Cracked tongue with white patches  Eyes:     General: No scleral icterus.  Right eye: No discharge.        Left eye: No discharge.     Extraocular Movements: Extraocular movements intact.     Conjunctiva/sclera: Conjunctivae normal.     Pupils: Pupils are equal, round, and reactive to light.  Cardiovascular:     Rate and Rhythm: Normal rate and regular rhythm.     Pulses: Normal pulses.     Heart sounds: Normal heart sounds. No murmur heard. No friction rub. No gallop.   Pulmonary:     Effort: Pulmonary effort is normal. No respiratory distress.     Breath sounds: Normal breath sounds. No stridor. No wheezing, rhonchi  or rales.  Chest:     Chest Kunde: No tenderness.  Musculoskeletal:        General: Normal range of motion.     Cervical back: Normal range of motion and neck supple.  Skin:    General: Skin is warm and dry.     Capillary Refill: Capillary refill takes less than 2 seconds.     Coloration: Skin is not jaundiced or pale.     Findings: No bruising, erythema, lesion or rash.  Neurological:     General: No focal deficit present.     Mental Status: She is alert and oriented to person, place, and time. Mental status is at baseline.  Psychiatric:        Mood and Affect: Mood normal.        Behavior: Behavior normal.        Thought Content: Thought content normal.        Judgment: Judgment normal.     Results for orders placed or performed during the hospital encounter of 08/67/61  Basic metabolic panel  Result Value Ref Range   Sodium 137 135 - 145 mmol/L   Potassium 4.2 3.5 - 5.1 mmol/L   Chloride 99 98 - 111 mmol/L   CO2 27 22 - 32 mmol/L   Glucose, Bld 130 (H) 70 - 99 mg/dL   BUN 10 8 - 23 mg/dL   Creatinine, Ser 0.68 0.44 - 1.00 mg/dL   Calcium 8.9 8.9 - 10.3 mg/dL   GFR, Estimated >60 >60 mL/min   Anion gap 11 5 - 15  CBC  Result Value Ref Range   WBC 5.9 4.0 - 10.5 K/uL   RBC 4.63 3.87 - 5.11 MIL/uL   Hemoglobin 12.1 12.0 - 15.0 g/dL   HCT 37.8 36.0 - 46.0 %   MCV 81.6 80.0 - 100.0 fL   MCH 26.1 26.0 - 34.0 pg   MCHC 32.0 30.0 - 36.0 g/dL   RDW 15.8 (H) 11.5 - 15.5 %   Platelets 266 150 - 400 K/uL   nRBC 0.0 0.0 - 0.2 %  Lipase, blood  Result Value Ref Range   Lipase 34 11 - 51 U/L  Troponin I (High Sensitivity)  Result Value Ref Range   Troponin I (High Sensitivity) 4 <18 ng/L      Assessment & Plan:   Problem List Items Addressed This Visit   None   Visit Diagnoses    Nasal congestion    -  Primary   Will treat with prednisone. Call if not getting better or getting worse. Conitnue to monitor. Call with any concerns.    Thrush       Will treat with  nystatin. Call with any concerns. Continue to monitor.    Relevant Medications   nystatin (MYCOSTATIN) 100000 UNIT/ML suspension   History of COVID-19  Lungs clear- no sign of covid pneumonia. Continue to monitor.        Follow up plan: Return in about 2 weeks (around 01/08/2021).

## 2020-12-28 ENCOUNTER — Encounter: Payer: Self-pay | Admitting: Family Medicine

## 2021-01-08 ENCOUNTER — Ambulatory Visit: Payer: Medicare HMO | Admitting: Family Medicine

## 2021-02-20 ENCOUNTER — Encounter: Payer: Self-pay | Admitting: Family Medicine

## 2021-02-20 ENCOUNTER — Ambulatory Visit (INDEPENDENT_AMBULATORY_CARE_PROVIDER_SITE_OTHER): Payer: Medicare HMO | Admitting: Family Medicine

## 2021-02-20 ENCOUNTER — Other Ambulatory Visit: Payer: Self-pay

## 2021-02-20 VITALS — BP 122/74 | HR 77 | Temp 98.1°F | Wt 110.0 lb

## 2021-02-20 DIAGNOSIS — E7800 Pure hypercholesterolemia, unspecified: Secondary | ICD-10-CM

## 2021-02-20 DIAGNOSIS — J309 Allergic rhinitis, unspecified: Secondary | ICD-10-CM

## 2021-02-20 DIAGNOSIS — D692 Other nonthrombocytopenic purpura: Secondary | ICD-10-CM

## 2021-02-20 DIAGNOSIS — D510 Vitamin B12 deficiency anemia due to intrinsic factor deficiency: Secondary | ICD-10-CM

## 2021-02-20 DIAGNOSIS — Z1231 Encounter for screening mammogram for malignant neoplasm of breast: Secondary | ICD-10-CM | POA: Diagnosis not present

## 2021-02-20 DIAGNOSIS — M7541 Impingement syndrome of right shoulder: Secondary | ICD-10-CM

## 2021-02-20 DIAGNOSIS — Z13820 Encounter for screening for osteoporosis: Secondary | ICD-10-CM

## 2021-02-20 DIAGNOSIS — D485 Neoplasm of uncertain behavior of skin: Secondary | ICD-10-CM

## 2021-02-20 DIAGNOSIS — E559 Vitamin D deficiency, unspecified: Secondary | ICD-10-CM

## 2021-02-20 MED ORDER — ONDANSETRON 4 MG PO TBDP: 4.0000 mg | ORAL_TABLET | Freq: Three times a day (TID) | ORAL | 1 refills | Status: DC | PRN

## 2021-02-20 MED ORDER — OMEPRAZOLE 20 MG PO CPDR: 20.0000 mg | DELAYED_RELEASE_CAPSULE | Freq: Every day | ORAL | 1 refills | Status: DC

## 2021-02-20 MED ORDER — CETIRIZINE HCL 10 MG PO TABS: 10.0000 mg | ORAL_TABLET | Freq: Every day | ORAL | 3 refills | Status: DC

## 2021-02-20 NOTE — Progress Notes (Signed)
BP 122/74   Pulse 77   Temp 98.1 F (36.7 C)   Wt 110 lb (49.9 kg)   SpO2 97%   BMI 20.12 kg/m    Subjective:    Patient ID: Kayla Johnson, female    DOB: Apr 04, 1946, 75 y.o.   MRN: [id number]  HPI: Kayla Johnson is a 75 y.o. female  Chief Complaint  Patient presents with  . Anemia   ANEMIA Anemia status: stable Etiology of anemia: pernicious anemia Duration of anemia treatment: chronic Compliance with treatment: excellent compliance B12 supplementation side effects: no Severity of anemia: mild Fatigue: no Decreased exercise tolerance: no  Dyspnea on exertion: no Palpitations: yes Bleeding: no Pica: no   Has been having some numbness in her fingers when she first wakes up in the morning. Then when she moves, it goes away. She has some soreness in the center of her hand. She has been using CBD oil and that seems like it's been helping.   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, nutritional: diffuse Onset:gradual Severity: severe  Quality:  Occasionally Sharp deep, but usually more aching Frequency: intermittent Radiation: yes- into her back Aggravating factors: nothing   Alleviating factors: nothing   Status: fluctuating Treatments attempted: rest, ice and heat  Relief with NSAIDs?:  No NSAIDs Taken Weakness: no Numbness: no Decreased grip strength: no Redness: no Swelling: no Bruising: no Fevers: no  SKIN LESION Duration: months Location: R anterior shoulder Painful: no Itching: no Onset: gradual Context: bigger History of skin cancer: no    Relevant past medical, surgical, family and social history reviewed and updated as indicated. Interim medical history since our last visit reviewed. Allergies and medications reviewed and updated.  Review of Systems  Constitutional: Negative.   Respiratory: Negative.   Cardiovascular: Negative.   Gastrointestinal: Negative.    Musculoskeletal: Positive for arthralgias. Negative for back pain, gait problem, joint swelling, myalgias, neck pain and neck stiffness.  Skin: Negative.   Psychiatric/Behavioral: Negative.     Per HPI unless specifically indicated above     Objective:    BP 122/74   Pulse 77   Temp 98.1 F (36.7 C)   Wt 110 lb (49.9 kg)   SpO2 97%   BMI 20.12 kg/m   Wt Readings from Last 3 Encounters:  02/20/21 110 lb (49.9 kg)  12/25/20 106 lb 12.8 oz (48.4 kg)  12/11/20 106 lb 14.8 oz (48.5 kg)    Physical Exam Vitals and nursing note reviewed.  Constitutional:      General: She is not in acute distress.    Appearance: Normal appearance. She is not ill-appearing, toxic-appearing or diaphoretic.  HENT:     Head: Normocephalic and atraumatic.     Right Ear: External ear normal.     Left Ear: External ear normal.     Nose: Nose normal.     Mouth/Throat:     Mouth: Mucous membranes are moist.     Pharynx: Oropharynx is clear.  Eyes:     General: No scleral icterus.       Right eye: No discharge.        Left eye: No discharge.     Extraocular Movements: Extraocular movements intact.     Conjunctiva/sclera: Conjunctivae normal.     Pupils: Pupils are equal, round, and reactive to light.  Cardiovascular:     Rate and Rhythm: Normal rate and regular rhythm.  Pulses: Normal pulses.     Heart sounds: Normal heart sounds. No murmur heard. No friction rub. No gallop.   Pulmonary:     Effort: Pulmonary effort is normal. No respiratory distress.     Breath sounds: Normal breath sounds. No stridor. No wheezing, rhonchi or rales.  Chest:     Chest [place]: No tenderness.  Musculoskeletal:        General: Normal range of motion.     Cervical back: Normal range of motion and neck supple.  Skin:    General: Skin is warm and dry.     Capillary Refill: Capillary refill takes less than 2 seconds.     Coloration: Skin is not jaundiced or pale.     Findings: No bruising, erythema, lesion or  rash.     Comments: 0.5cm stuck on hyperpigmented lesion with area of increased hyperpigmentation in it.   Neurological:     General: No focal deficit present.     Mental Status: She is alert and oriented to person, place, and time. Mental status is at baseline.  Psychiatric:        Mood and Affect: Mood normal.        Behavior: Behavior normal.        Thought Content: Thought content normal.        Judgment: Judgment normal.     Results for orders placed or performed during the hospital encounter of 09/47/09  Basic metabolic panel  Result Value Ref Range   Sodium 137 135 - 145 mmol/L   Potassium 4.2 3.5 - 5.1 mmol/L   Chloride 99 98 - 111 mmol/L   CO2 27 22 - 32 mmol/L   Glucose, Bld 130 (H) 70 - 99 mg/dL   BUN 10 8 - 23 mg/dL   Creatinine, Ser 0.68 0.44 - 1.00 mg/dL   Calcium 8.9 8.9 - 10.3 mg/dL   GFR, Estimated >60 >60 mL/min   Anion gap 11 5 - 15  CBC  Result Value Ref Range   WBC 5.9 4.0 - 10.5 K/uL   RBC 4.63 3.87 - 5.11 MIL/uL   Hemoglobin 12.1 12.0 - 15.0 g/dL   HCT 37.8 36.0 - 46.0 %   MCV 81.6 80.0 - 100.0 fL   MCH 26.1 26.0 - 34.0 pg   MCHC 32.0 30.0 - 36.0 g/dL   RDW 15.8 (H) 11.5 - 15.5 %   Platelets 266 150 - 400 K/uL   nRBC 0.0 0.0 - 0.2 %  Lipase, blood  Result Value Ref Range   Lipase 34 11 - 51 U/L  Troponin I (High Sensitivity)  Result Value Ref Range   Troponin I (High Sensitivity) 4 <18 ng/L      Assessment & Plan:   Problem List Items Addressed This Visit      Cardiovascular and Mediastinum   Senile purpura (HCC)    Stable. Reassured patient. Call with any concerns.         Respiratory   Allergic rhinitis    Under good control on current regimen. Continue current regimen. Continue to monitor. Call with any concerns. Refills given. Labs drawn today.       Relevant Medications   cetirizine (ZYRTEC) 10 MG tablet     Other   Pernicious anemia - Primary    Rechecking labs today. Await results. Treat as needed.       Relevant  Orders   CBC with Differential/Platelet   B12   Shoulder impingement syndrome, right  Encouraged exercises and voltaren. Call if not getting better or getting worse.       Vitamin D deficiency    Rechecking labs today. Await results. Treat as needed.       Relevant Orders   VITAMIN D 25 Hydroxy (Vit-D Deficiency, Fractures)   Hypercholesteremia    Rechecking labs today. Await results. Treat as needed.       Relevant Orders   Comprehensive metabolic panel   Lipid Panel w/o Chol/HDL Ratio    Other Visit Diagnoses    Neoplasm of uncertain behavior of skin       Will get her in for shave bx asap.   Encounter for screening mammogram for malignant neoplasm of breast       Mammogram ordered today.    Relevant Orders   MM 3D SCREEN BREAST BILATERAL   Screening for osteoporosis       DEXA ordered today.   Relevant Orders   DG Bone Density       Follow up plan: Return in about 6 months (around 08/23/2021) for physical with PCP, ASAP for shave bx with me.

## 2021-02-20 NOTE — Assessment & Plan Note (Signed)
Encouraged exercises and voltaren. Call if not getting better or getting worse.

## 2021-02-20 NOTE — Assessment & Plan Note (Signed)
Stable. Reassured patient. Call with any concerns.

## 2021-02-20 NOTE — Assessment & Plan Note (Signed)
Rechecking labs today. Await results. Treat as needed.  °

## 2021-02-20 NOTE — Patient Instructions (Addendum)
voltaren gel  Call to schedule your mammogram and bone density:  [facility]  Address: [address], [place], [place] [address]  Phone: [phone]  Shoulder Exercises Ask your health care provider which exercises are safe for you. Do exercises exactly as told by your health care provider and adjust them as directed. It is normal to feel mild stretching, pulling, tightness, or discomfort as you do these exercises. Stop right away if you feel sudden pain or your pain gets worse. Do not begin these exercises until told by your health care provider. Stretching exercises External rotation and abduction This exercise is sometimes called corner stretch. This exercise rotates your arm outward (external rotation) and moves your arm out from your body (abduction). 1. Stand in a doorway with one of your feet slightly in front of the other. This is called a staggered stance. If you cannot reach your forearms to the door frame, stand facing a corner of a room. 2. Choose one of the following positions as told by your health care provider: ? Place your hands and forearms on the door frame above your head. ? Place your hands and forearms on the door frame at the height of your head. ? Place your hands on the door frame at the height of your elbows. 3. Slowly move your weight onto your front foot until you feel a stretch across your chest and in the front of your shoulders. Keep your head and chest upright and keep your abdominal muscles tight. 4. Hold for __________ seconds. 5. To release the stretch, shift your weight to your back foot. Repeat __________ times. Complete this exercise __________ times a day.   Extension, standing 1. Stand and hold a broomstick, a cane, or a similar object behind your back. ? Your hands should be a little wider than shoulder width apart. ? Your palms should face away from your back. 2. Keeping your elbows straight and your shoulder  muscles relaxed, move the stick away from your body until you feel a stretch in your shoulders (extension). ? Avoid shrugging your shoulders while you move the stick. Keep your shoulder blades tucked down toward the middle of your back. 3. Hold for __________ seconds. 4. Slowly return to the starting position. Repeat __________ times. Complete this exercise __________ times a day. Range-of-motion exercises Pendulum 1. Stand near a [place] or a surface that you can hold onto for balance. 2. Bend at the waist and let your left / right arm hang straight down. Use your other arm to support you. Keep your back straight and do not lock your knees. 3. Relax your left / right arm and shoulder muscles, and move your hips and your trunk so your left / right arm swings freely. Your arm should swing because of the motion of your body, not because you are using your arm or shoulder muscles. 4. Keep moving your hips and trunk so your arm swings in the following directions, as told by your health care provider: ? Side to side. ? Forward and backward. ? In clockwise and counterclockwise circles. 5. Continue each motion for __________ seconds, or for as long as told by your health care provider. 6. Slowly return to the starting position. Repeat __________ times. Complete this exercise __________ times a day.   Shoulder flexion, standing 1. Stand and hold a broomstick, a cane, or a similar object. Place your hands a little more than shoulder width apart on the object. Your  left / right hand should be palm up, and your other hand should be palm down. 2. Keep your elbow straight and your shoulder muscles relaxed. Push the stick up with your healthy arm to raise your left / right arm in front of your body, and then over your head until you feel a stretch in your shoulder (flexion). ? Avoid shrugging your shoulder while you raise your arm. Keep your shoulder blade tucked down toward the middle of your back. 3. Hold for  __________ seconds. 4. Slowly return to the starting position. Repeat __________ times. Complete this exercise __________ times a day.   Shoulder abduction, standing 1. Stand and hold a broomstick, a cane, or a similar object. Place your hands a little more than shoulder width apart on the object. Your left / right hand should be palm up, and your other hand should be palm down. 2. Keep your elbow straight and your shoulder muscles relaxed. Push the object across your body toward your left / right side. Raise your left / right arm to the side of your body (abduction) until you feel a stretch in your shoulder. ? Do not raise your arm above shoulder height unless your health care provider tells you to do that. ? If directed, raise your arm over your head. ? Avoid shrugging your shoulder while you raise your arm. Keep your shoulder blade tucked down toward the middle of your back. 3. Hold for __________ seconds. 4. Slowly return to the starting position. Repeat __________ times. Complete this exercise __________ times a day. Internal rotation 1. Place your left / right hand behind your back, palm up. 2. Use your other hand to dangle an exercise band, a towel, or a similar object over your shoulder. Grasp the band with your left / right hand so you are holding on to both ends. 3. Gently pull up on the band until you feel a stretch in the front of your left / right shoulder. The movement of your arm toward the center of your body is called internal rotation. ? Avoid shrugging your shoulder while you raise your arm. Keep your shoulder blade tucked down toward the middle of your back. 4. Hold for __________ seconds. 5. Release the stretch by letting go of the band and lowering your hands. Repeat __________ times. Complete this exercise __________ times a day.   Strengthening exercises External rotation 1. Sit in a stable chair without armrests. 2. Secure an exercise band to a stable object at elbow  height on your left / right side. 3. Place a soft object, such as a folded towel or a small pillow, between your left / right upper arm and your body to move your elbow about 4 inches (10 cm) away from your side. 4. Hold the end of the exercise band so it is tight and there is no slack. 5. Keeping your elbow pressed against the soft object, slowly move your forearm out, away from your abdomen (external rotation). Keep your body steady so only your forearm moves. 6. Hold for __________ seconds. 7. Slowly return to the starting position. Repeat __________ times. Complete this exercise __________ times a day.   Shoulder abduction 1. Sit in a stable chair without armrests, or stand up. 2. Hold a __________ weight in your left / right hand, or hold an exercise band with both hands. 3. Start with your arms straight down and your left / right palm facing in, toward your body. 4. Slowly lift your left /  right hand out to your side (abduction). Do not lift your hand above shoulder height unless your health care provider tells you that this is safe. ? Keep your arms straight. ? Avoid shrugging your shoulder while you do this movement. Keep your shoulder blade tucked down toward the middle of your back. 5. Hold for __________ seconds. 6. Slowly lower your arm, and return to the starting position. Repeat __________ times. Complete this exercise __________ times a day.   Shoulder extension 1. Sit in a stable chair without armrests, or stand up. 2. Secure an exercise band to a stable object in front of you so it is at shoulder height. 3. Hold one end of the exercise band in each hand. Your palms should face each other. 4. Straighten your elbows and lift your hands up to shoulder height. 5. Step back, away from the secured end of the exercise band, until the band is tight and there is no slack. 6. Squeeze your shoulder blades together as you pull your hands down to the sides of your thighs (extension). Stop  when your hands are straight down by your sides. Do not let your hands go behind your body. 7. Hold for __________ seconds. 8. Slowly return to the starting position. Repeat __________ times. Complete this exercise __________ times a day. Shoulder row 1. Sit in a stable chair without armrests, or stand up. 2. Secure an exercise band to a stable object in front of you so it is at waist height. 3. Hold one end of the exercise band in each hand. Position your palms so that your thumbs are facing the ceiling (neutral position). 4. Bend each of your elbows to a 90-degree angle (right angle) and keep your upper arms at your sides. 5. Step back until the band is tight and there is no slack. 6. Slowly pull your elbows back behind you. 7. Hold for __________ seconds. 8. Slowly return to the starting position. Repeat __________ times. Complete this exercise __________ times a day. Shoulder press-ups 1. Sit in a stable chair that has armrests. Sit upright, with your feet flat on the floor. 2. Put your hands on the armrests so your elbows are bent and your fingers are pointing forward. Your hands should be about even with the sides of your body. 3. Push down on the armrests and use your arms to lift yourself off the chair. Straighten your elbows and lift yourself up as much as you comfortably can. ? Move your shoulder blades down, and avoid letting your shoulders move up toward your ears. ? Keep your feet on the ground. As you get stronger, your feet should support less of your body weight as you lift yourself up. 4. Hold for __________ seconds. 5. Slowly lower yourself back into the chair. Repeat __________ times. Complete this exercise __________ times a day.   [place] push-ups 1. Stand so you are facing a stable [place]. Your feet should be about one arm-length away from the [place]. 2. Lean forward and place your palms on the [place] at shoulder height. 3. Keep your feet flat on the floor as you bend your  elbows and lean forward toward the [place]. 4. Hold for __________ seconds. 5. Straighten your elbows to push yourself back to the starting position. Repeat __________ times. Complete this exercise __________ times a day.   This information is not intended to replace advice given to you by your health care provider. Make sure you discuss any questions you have with your health  care provider. Document Revised: 03/11/2019 Document Reviewed: 12/17/2018 Elsevier Patient Education  2021 [employer].

## 2021-02-20 NOTE — Assessment & Plan Note (Signed)
Under good control on current regimen. Continue current regimen. Continue to monitor. Call with any concerns. Refills given. Labs drawn today.   

## 2021-02-21 LAB — COMPREHENSIVE METABOLIC PANEL
ALT: 14 IU/L (ref 0–32)
AST: 20 IU/L (ref 0–40)
Albumin/Globulin Ratio: 2.2 (ref 1.2–2.2)
Albumin: 4.2 g/dL (ref 3.7–4.7)
Alkaline Phosphatase: 84 IU/L (ref 44–121)
BUN/Creatinine Ratio: 18 (ref 12–28)
BUN: 11 mg/dL (ref 8–27)
Bilirubin Total: 0.3 mg/dL (ref 0.0–1.2)
CO2: 25 mmol/L (ref 20–29)
Calcium: 9.1 mg/dL (ref 8.7–10.3)
Chloride: 101 mmol/L (ref 96–106)
Creatinine, Ser: 0.6 mg/dL (ref 0.57–1.00)
Globulin, Total: 1.9 g/dL (ref 1.5–4.5)
Glucose: 91 mg/dL (ref 65–99)
Potassium: 4.4 mmol/L (ref 3.5–5.2)
Sodium: 144 mmol/L (ref 134–144)
Total Protein: 6.1 g/dL (ref 6.0–8.5)
eGFR: 93 mL/min/{1.73_m2} (ref >59)

## 2021-02-21 LAB — LIPID PANEL W/O CHOL/HDL RATIO
Cholesterol, Total: 191 mg/dL (ref 100–199)
HDL: 60 mg/dL (ref >39)
LDL Chol Calc (NIH): 122 mg/dL — ABNORMAL HIGH (ref 0–99)
Triglycerides: 45 mg/dL (ref 0–149)
VLDL Cholesterol Cal: 9 mg/dL (ref 5–40)

## 2021-02-21 LAB — CBC WITH DIFFERENTIAL/PLATELET
Basophils Absolute: 0 10*3/uL (ref 0.0–0.2)
Basos: 1 %
EOS (ABSOLUTE): 0.1 10*3/uL (ref 0.0–0.4)
Eos: 2 %
Hematocrit: 37.2 % (ref 34.0–46.6)
Hemoglobin: 12 g/dL (ref 11.1–15.9)
Immature Grans (Abs): 0 10*3/uL (ref 0.0–0.1)
Immature Granulocytes: 0 %
Lymphocytes Absolute: 1.5 10*3/uL (ref 0.7–3.1)
Lymphs: 38 %
MCH: 26.4 pg — ABNORMAL LOW (ref 26.6–33.0)
MCHC: 32.3 g/dL (ref 31.5–35.7)
MCV: 82 fL (ref 79–97)
Monocytes Absolute: 0.4 10*3/uL (ref 0.1–0.9)
Monocytes: 9 %
Neutrophils Absolute: 2 10*3/uL (ref 1.4–7.0)
Neutrophils: 50 %
Platelets: 324 10*3/uL (ref 150–450)
RBC: 4.55 x10E6/uL (ref 3.77–5.28)
RDW: 15.9 % — ABNORMAL HIGH (ref 11.7–15.4)
WBC: 3.9 10*3/uL (ref 3.4–10.8)

## 2021-02-21 LAB — VITAMIN B12: Vitamin B-12: 977 pg/mL (ref 232–1245)

## 2021-02-21 LAB — VITAMIN D 25 HYDROXY (VIT D DEFICIENCY, FRACTURES): Vit D, 25-Hydroxy: 51 ng/mL (ref 30.0–100.0)

## 2021-03-13 ENCOUNTER — Encounter: Payer: Self-pay | Admitting: Family Medicine

## 2021-03-13 ENCOUNTER — Ambulatory Visit (INDEPENDENT_AMBULATORY_CARE_PROVIDER_SITE_OTHER): Payer: Medicare HMO | Admitting: Family Medicine

## 2021-03-13 ENCOUNTER — Other Ambulatory Visit: Payer: Self-pay

## 2021-03-13 VITALS — BP 126/72 | HR 85 | Temp 99.2°F | Wt 111.6 lb

## 2021-03-13 DIAGNOSIS — D485 Neoplasm of uncertain behavior of skin: Secondary | ICD-10-CM

## 2021-03-13 NOTE — Progress Notes (Signed)
Appointment cancelled due to lidocaine allergy- will refer to dermatology

## 2021-03-20 DIAGNOSIS — L578 Other skin changes due to chronic exposure to nonionizing radiation: Secondary | ICD-10-CM | POA: Diagnosis not present

## 2021-03-20 DIAGNOSIS — L821 Other seborrheic keratosis: Secondary | ICD-10-CM | POA: Diagnosis not present

## 2021-03-20 DIAGNOSIS — D225 Melanocytic nevi of trunk: Secondary | ICD-10-CM | POA: Diagnosis not present

## 2021-03-20 DIAGNOSIS — L720 Epidermal cyst: Secondary | ICD-10-CM | POA: Diagnosis not present

## 2021-05-19 DIAGNOSIS — Z79899 Other long term (current) drug therapy: Secondary | ICD-10-CM | POA: Diagnosis not present

## 2021-05-19 DIAGNOSIS — R519 Headache, unspecified: Secondary | ICD-10-CM | POA: Diagnosis not present

## 2021-05-19 DIAGNOSIS — Z87891 Personal history of nicotine dependence: Secondary | ICD-10-CM | POA: Diagnosis not present

## 2021-05-19 DIAGNOSIS — Z20822 Contact with and (suspected) exposure to covid-19: Secondary | ICD-10-CM | POA: Diagnosis not present

## 2021-05-19 DIAGNOSIS — Z28310 Unvaccinated for covid-19: Secondary | ICD-10-CM | POA: Diagnosis not present

## 2021-05-19 DIAGNOSIS — J069 Acute upper respiratory infection, unspecified: Secondary | ICD-10-CM | POA: Diagnosis not present

## 2021-05-19 DIAGNOSIS — R110 Nausea: Secondary | ICD-10-CM | POA: Diagnosis not present

## 2021-07-30 DIAGNOSIS — J349 Unspecified disorder of nose and nasal sinuses: Secondary | ICD-10-CM | POA: Diagnosis not present

## 2021-09-11 ENCOUNTER — Telehealth: Payer: Self-pay | Admitting: Nurse Practitioner

## 2021-09-11 NOTE — Telephone Encounter (Signed)
Copied from [place] [id number]. Topic: Medicare AWV >> Sep 11, 2021  3:52 PM Lavonia Drafts wrote: Reason for CRM:  Left message for patient to call back and schedule the Medicare Annual Wellness Visit (AWV) virtually or by telephone.  Last AWV 04/14/18  Please schedule at anytime with [facility] Health Advisor.  45 minute appointment  Any questions, please call me at [phone]

## 2021-09-13 DIAGNOSIS — H5213 Myopia, bilateral: Secondary | ICD-10-CM | POA: Diagnosis not present

## 2021-09-13 DIAGNOSIS — H2513 Age-related nuclear cataract, bilateral: Secondary | ICD-10-CM | POA: Diagnosis not present

## 2021-09-13 DIAGNOSIS — H524 Presbyopia: Secondary | ICD-10-CM | POA: Diagnosis not present

## 2021-09-13 DIAGNOSIS — H18513 Endothelial corneal dystrophy, bilateral: Secondary | ICD-10-CM | POA: Diagnosis not present

## 2021-10-05 DIAGNOSIS — M542 Cervicalgia: Secondary | ICD-10-CM | POA: Diagnosis not present

## 2021-10-23 ENCOUNTER — Ambulatory Visit
Admission: RE | Admit: 2021-10-23 | Discharge: 2021-10-23 | Disposition: A | Discharge status: Home / Self Care | Payer: Medicare HMO | Attending: Family Medicine | Admitting: Family Medicine

## 2021-10-23 ENCOUNTER — Ambulatory Visit
Admission: RE | Admit: 2021-10-23 | Discharge: 2021-10-23 | Disposition: A | Discharge status: Home / Self Care | Payer: Medicare HMO | Source: Ambulatory Visit | Attending: Family Medicine | Admitting: Family Medicine

## 2021-10-23 ENCOUNTER — Encounter: Payer: Self-pay | Admitting: Family Medicine

## 2021-10-23 ENCOUNTER — Other Ambulatory Visit: Payer: Self-pay

## 2021-10-23 ENCOUNTER — Ambulatory Visit (INDEPENDENT_AMBULATORY_CARE_PROVIDER_SITE_OTHER): Payer: Medicare HMO | Admitting: Family Medicine

## 2021-10-23 VITALS — BP 140/81 | HR 91 | Temp 97.9°F | Wt 109.6 lb

## 2021-10-23 DIAGNOSIS — M25511 Pain in right shoulder: Secondary | ICD-10-CM

## 2021-10-23 DIAGNOSIS — M542 Cervicalgia: Secondary | ICD-10-CM | POA: Diagnosis not present

## 2021-10-23 NOTE — Progress Notes (Signed)
BP 140/81   Pulse 91   Temp 97.9 F (36.6 C)   Wt 109 lb 9.6 oz (49.7 kg)   SpO2 98%   BMI 20.05 kg/m    Subjective:    Patient ID: Kayla Johnson, female    DOB: 05-03-1946, 75 y.o.   MRN: [id number]  HPI: Kayla Johnson is a 75 y.o. female  Chief Complaint  Patient presents with   Pain    Patient states she is having neck and shoulder pain    NECK PAIN Duration: 4 weeks Status: worse Treatments attempted: tizanidine, naproxen  Compliant with recommended treatment: no Relief with NSAIDs?:  mild Location:Right Severity: moderate Quality: tight Frequency: constant Radiation:  R shoulder Aggravating factors: moving Alleviating factors: pulling her shoulder blades down and heating pad Weakness:  no Paresthesias / decreased sensation:  no  Fevers:  no   Relevant past medical, surgical, family and social history reviewed and updated as indicated. Interim medical history since our last visit reviewed. Allergies and medications reviewed and updated.  Review of Systems  Constitutional: Negative.   Respiratory: Negative.    Cardiovascular: Negative.   Gastrointestinal: Negative.   Musculoskeletal:  Positive for arthralgias, myalgias, neck pain and neck stiffness. Negative for back pain, gait problem and joint swelling.  Skin: Negative.   Neurological: Negative.   Psychiatric/Behavioral: Negative.     Per HPI unless specifically indicated above     Objective:    BP 140/81   Pulse 91   Temp 97.9 F (36.6 C)   Wt 109 lb 9.6 oz (49.7 kg)   SpO2 98%   BMI 20.05 kg/m   Wt Readings from Last 3 Encounters:  10/23/21 109 lb 9.6 oz (49.7 kg)  03/13/21 111 lb 9.6 oz (50.6 kg)  02/20/21 110 lb (49.9 kg)    Physical Exam Vitals and nursing note reviewed.  Constitutional:      General: She is not in acute distress.    Appearance: Normal appearance. She is not ill-appearing, toxic-appearing or diaphoretic.  HENT:     Head: Normocephalic and atraumatic.     Right  Ear: External ear normal.     Left Ear: External ear normal.     Nose: Nose normal.     Mouth/Throat:     Mouth: Mucous membranes are moist.     Pharynx: Oropharynx is clear.  Eyes:     General: No scleral icterus.       Right eye: No discharge.        Left eye: No discharge.     Extraocular Movements: Extraocular movements intact.     Conjunctiva/sclera: Conjunctivae normal.     Pupils: Pupils are equal, round, and reactive to light.  Cardiovascular:     Rate and Rhythm: Normal rate and regular rhythm.     Pulses: Normal pulses.     Heart sounds: Normal heart sounds. No murmur heard.   No friction rub. No gallop.  Pulmonary:     Effort: Pulmonary effort is normal. No respiratory distress.     Breath sounds: Normal breath sounds. No stridor. No wheezing, rhonchi or rales.  Chest:     Chest [place]: No tenderness.  Musculoskeletal:        General: Normal range of motion.     Cervical back: Normal range of motion and neck supple.  Skin:    General: Skin is warm and dry.     Capillary Refill: Capillary refill takes less than 2 seconds.  Coloration: Skin is not jaundiced or pale.     Findings: No bruising, erythema, lesion or rash.  Neurological:     General: No focal deficit present.     Mental Status: She is alert and oriented to person, place, and time. Mental status is at baseline.  Psychiatric:        Mood and Affect: Mood normal.        Behavior: Behavior normal.        Thought Content: Thought content normal.        Judgment: Judgment normal.    Results for orders placed or performed in visit on 02/20/21  VITAMIN D 25 Hydroxy (Vit-D Deficiency, Fractures)  Result Value Ref Range   Vit D, 25-Hydroxy 51.0 30.0 - 100.0 ng/mL  CBC with Differential/Platelet  Result Value Ref Range   WBC 3.9 3.4 - 10.8 x10E3/uL   RBC 4.55 3.77 - 5.28 x10E6/uL   Hemoglobin 12.0 11.1 - 15.9 g/dL   Hematocrit 37.2 34.0 - 46.6 %   MCV 82 79 - 97 fL   MCH 26.4 (L) 26.6 - 33.0 pg   MCHC  32.3 31.5 - 35.7 g/dL   RDW 15.9 (H) 11.7 - 15.4 %   Platelets 324 150 - 450 x10E3/uL   Neutrophils 50 Not Estab. %   Lymphs 38 Not Estab. %   Monocytes 9 Not Estab. %   Eos 2 Not Estab. %   Basos 1 Not Estab. %   Neutrophils Absolute 2.0 1.4 - 7.0 x10E3/uL   Lymphocytes Absolute 1.5 0.7 - 3.1 x10E3/uL   Monocytes Absolute 0.4 0.1 - 0.9 x10E3/uL   EOS (ABSOLUTE) 0.1 0.0 - 0.4 x10E3/uL   Basophils Absolute 0.0 0.0 - 0.2 x10E3/uL   Immature Granulocytes 0 Not Estab. %   Immature Grans (Abs) 0.0 0.0 - 0.1 x10E3/uL  B12  Result Value Ref Range   Vitamin B-12 977 232 - 1,245 pg/mL  Comprehensive metabolic panel  Result Value Ref Range   Glucose 91 65 - 99 mg/dL   BUN 11 8 - 27 mg/dL   Creatinine, Ser 0.60 0.57 - 1.00 mg/dL   eGFR 93 >59 mL/min/1.73   BUN/Creatinine Ratio 18 12 - 28   Sodium 144 134 - 144 mmol/L   Potassium 4.4 3.5 - 5.2 mmol/L   Chloride 101 96 - 106 mmol/L   CO2 25 20 - 29 mmol/L   Calcium 9.1 8.7 - 10.3 mg/dL   Total Protein 6.1 6.0 - 8.5 g/dL   Albumin 4.2 3.7 - 4.7 g/dL   Globulin, Total 1.9 1.5 - 4.5 g/dL   Albumin/Globulin Ratio 2.2 1.2 - 2.2   Bilirubin Total 0.3 0.0 - 1.2 mg/dL   Alkaline Phosphatase 84 44 - 121 IU/L   AST 20 0 - 40 IU/L   ALT 14 0 - 32 IU/L  Lipid Panel w/o Chol/HDL Ratio  Result Value Ref Range   Cholesterol, Total 191 100 - 199 mg/dL   Triglycerides 45 0 - 149 mg/dL   HDL 60 >39 mg/dL   VLDL Cholesterol Cal 9 5 - 40 mg/dL   LDL Chol Calc (NIH) 122 (H) 0 - 99 mg/dL      Assessment & Plan:   Problem List Items Addressed This Visit   None Visit Diagnoses     Acute pain of right shoulder    -  Primary   About a month now. Take naproxen BID and tizanidine nightly. Will get x-rays, start stretches and get her into PT.  Call if not getting better or getting worse.    Relevant Orders   DG Cervical Spine Complete   DG Shoulder Right   Ambulatory referral to Physical Therapy        Follow up plan: Return in about 2 weeks  (around 11/06/2021), or with pcp.

## 2021-11-07 ENCOUNTER — Ambulatory Visit (INDEPENDENT_AMBULATORY_CARE_PROVIDER_SITE_OTHER): Payer: Medicare HMO | Admitting: Nurse Practitioner

## 2021-11-07 ENCOUNTER — Other Ambulatory Visit: Payer: Self-pay

## 2021-11-07 ENCOUNTER — Encounter: Payer: Self-pay | Admitting: Nurse Practitioner

## 2021-11-07 VITALS — BP 136/76 | HR 76 | Temp 98.5°F | Ht 62.01 in | Wt 113.6 lb

## 2021-11-07 DIAGNOSIS — E559 Vitamin D deficiency, unspecified: Secondary | ICD-10-CM | POA: Diagnosis not present

## 2021-11-07 DIAGNOSIS — E7800 Pure hypercholesterolemia, unspecified: Secondary | ICD-10-CM

## 2021-11-07 DIAGNOSIS — I700 Atherosclerosis of aorta: Secondary | ICD-10-CM | POA: Insufficient documentation

## 2021-11-07 DIAGNOSIS — D510 Vitamin B12 deficiency anemia due to intrinsic factor deficiency: Secondary | ICD-10-CM

## 2021-11-07 DIAGNOSIS — J309 Allergic rhinitis, unspecified: Secondary | ICD-10-CM | POA: Diagnosis not present

## 2021-11-07 DIAGNOSIS — D692 Other nonthrombocytopenic purpura: Secondary | ICD-10-CM | POA: Diagnosis not present

## 2021-11-07 MED ORDER — CETIRIZINE HCL 10 MG PO TABS: 10.0000 mg | ORAL_TABLET | Freq: Every day | ORAL | 3 refills | Status: DC

## 2021-11-07 MED ORDER — NAPROXEN 500 MG PO TABS: 500.0000 mg | ORAL_TABLET | Freq: Two times a day (BID) | ORAL | 1 refills | Status: DC

## 2021-11-07 MED ORDER — TIZANIDINE HCL 4 MG PO TABS: 4.0000 mg | ORAL_TABLET | Freq: Three times a day (TID) | ORAL | 1 refills | Status: DC | PRN

## 2021-11-07 NOTE — Progress Notes (Addendum)
BP 136/76   Pulse 76   Temp 98.5 F (36.9 C) (Oral)   Ht 5' 2.01" (1.575 m)   Wt 113 lb 9.6 oz (51.5 kg)   SpO2 97%   BMI 20.77 kg/m    Subjective:    Patient ID: [name], female    DOB: 1946-08-09, 75 y.o.   MRN: [id number]  HPI: DAYAMI TAITT is a 75 y.o. female  Chief Complaint  Patient presents with   Shoulder Pain    Patient is still currently have pain, is till taking tizanidine and naproxen, and has also set up PT. Patient is worried that these 2 medications are addicting.   Patient is here to follow up on her shoulder pain.  Patient is taking the Tizandine and it is helping her pain.  She uses the naproxen is once daily.  She would like refills on both medications.  She has PT set up for next week.  HYPERLIPIDEMIA Hyperlipidemia status: excellent compliance Satisfied with current treatment?  no Side effects:  no Medication compliance: excellent compliance Past cholesterol meds: none Supplements: none Aspirin:  no The ASCVD Risk score (Arnett DK, et al., 2019) failed to calculate for the following reasons:   Unable to determine if patient is Non-Hispanic African American Chest pain:  no Coronary artery disease:  no Family history CAD:  no Family history early CAD:  no  Denies HA, CP, SOB, dizziness, palpitations, visual changes, and lower extremity swelling.  Relevant past medical, surgical, family and social history reviewed and updated as indicated. Interim medical history since our last visit reviewed. Allergies and medications reviewed and updated.  Review of Systems  Eyes:  Negative for visual disturbance.  Respiratory:  Negative for cough, chest tightness and shortness of breath.   Cardiovascular:  Negative for chest pain, palpitations and leg swelling.  Neurological:  Negative for dizziness and headaches.   Per HPI unless specifically indicated above     Objective:    BP 136/76   Pulse 76   Temp 98.5 F (36.9 C) (Oral)   Ht 5' 2.01" (1.575  m)   Wt 113 lb 9.6 oz (51.5 kg)   SpO2 97%   BMI 20.77 kg/m   Wt Readings from Last 3 Encounters:  11/07/21 113 lb 9.6 oz (51.5 kg)  10/23/21 109 lb 9.6 oz (49.7 kg)  03/13/21 111 lb 9.6 oz (50.6 kg)    Physical Exam Vitals and nursing note reviewed.  Constitutional:      General: She is not in acute distress.    Appearance: Normal appearance. She is normal weight. She is not ill-appearing, toxic-appearing or diaphoretic.  HENT:     Head: Normocephalic.     Right Ear: External ear normal.     Left Ear: External ear normal.     Nose: Nose normal.     Mouth/Throat:     Mouth: Mucous membranes are moist.     Pharynx: Oropharynx is clear.  Eyes:     General:        Right eye: No discharge.        Left eye: No discharge.     Extraocular Movements: Extraocular movements intact.     Conjunctiva/sclera: Conjunctivae normal.     Pupils: Pupils are equal, round, and reactive to light.  Cardiovascular:     Rate and Rhythm: Normal rate and regular rhythm.     Heart sounds: No murmur heard. Pulmonary:     Effort: Pulmonary effort is normal. No  respiratory distress.     Breath sounds: Normal breath sounds. No wheezing or rales.  Musculoskeletal:     Cervical back: Normal range of motion and neck supple.  Skin:    General: Skin is warm and dry.     Capillary Refill: Capillary refill takes less than 2 seconds.  Neurological:     General: No focal deficit present.     Mental Status: She is alert and oriented to person, place, and time. Mental status is at baseline.  Psychiatric:        Mood and Affect: Mood normal.        Behavior: Behavior normal.        Thought Content: Thought content normal.        Judgment: Judgment normal.    Results for orders placed or performed in visit on 02/20/21  VITAMIN D 25 Hydroxy (Vit-D Deficiency, Fractures)  Result Value Ref Range   Vit D, 25-Hydroxy 51.0 30.0 - 100.0 ng/mL  CBC with Differential/Platelet  Result Value Ref Range   WBC 3.9 3.4  - 10.8 x10E3/uL   RBC 4.55 3.77 - 5.28 x10E6/uL   Hemoglobin 12.0 11.1 - 15.9 g/dL   Hematocrit 37.2 34.0 - 46.6 %   MCV 82 79 - 97 fL   MCH 26.4 (L) 26.6 - 33.0 pg   MCHC 32.3 31.5 - 35.7 g/dL   RDW 15.9 (H) 11.7 - 15.4 %   Platelets 324 150 - 450 x10E3/uL   Neutrophils 50 Not Estab. %   Lymphs 38 Not Estab. %   Monocytes 9 Not Estab. %   Eos 2 Not Estab. %   Basos 1 Not Estab. %   Neutrophils Absolute 2.0 1.4 - 7.0 x10E3/uL   Lymphocytes Absolute 1.5 0.7 - 3.1 x10E3/uL   Monocytes Absolute 0.4 0.1 - 0.9 x10E3/uL   EOS (ABSOLUTE) 0.1 0.0 - 0.4 x10E3/uL   Basophils Absolute 0.0 0.0 - 0.2 x10E3/uL   Immature Granulocytes 0 Not Estab. %   Immature Grans (Abs) 0.0 0.0 - 0.1 x10E3/uL  B12  Result Value Ref Range   Vitamin B-12 977 232 - 1,245 pg/mL  Comprehensive metabolic panel  Result Value Ref Range   Glucose 91 65 - 99 mg/dL   BUN 11 8 - 27 mg/dL   Creatinine, Ser 0.60 0.57 - 1.00 mg/dL   eGFR 93 >59 mL/min/1.73   BUN/Creatinine Ratio 18 12 - 28   Sodium 144 134 - 144 mmol/L   Potassium 4.4 3.5 - 5.2 mmol/L   Chloride 101 96 - 106 mmol/L   CO2 25 20 - 29 mmol/L   Calcium 9.1 8.7 - 10.3 mg/dL   Total Protein 6.1 6.0 - 8.5 g/dL   Albumin 4.2 3.7 - 4.7 g/dL   Globulin, Total 1.9 1.5 - 4.5 g/dL   Albumin/Globulin Ratio 2.2 1.2 - 2.2   Bilirubin Total 0.3 0.0 - 1.2 mg/dL   Alkaline Phosphatase 84 44 - 121 IU/L   AST 20 0 - 40 IU/L   ALT 14 0 - 32 IU/L  Lipid Panel w/o Chol/HDL Ratio  Result Value Ref Range   Cholesterol, Total 191 100 - 199 mg/dL   Triglycerides 45 0 - 149 mg/dL   HDL 60 >39 mg/dL   VLDL Cholesterol Cal 9 5 - 40 mg/dL   LDL Chol Calc (NIH) 122 (H) 0 - 99 mg/dL      Assessment & Plan:   Problem List Items Addressed This Visit  Cardiovascular and Mediastinum   Senile purpura ([place]) - Primary    Reassured patient, continue to monitor.      Aortic atherosclerosis ([place])    Found on CT 12/11/20. Stable. Labs ordered today. Will make  recommendations based on lab results.         Respiratory   Allergic rhinitis    Controlled with Zyrtec. Refill sent today.       Relevant Medications   cetirizine (ZYRTEC) 10 MG tablet     Other   Pernicious anemia    Chronic.  Controlled.  Continue with current medication regimen of OTC b12..  Labs ordered today.  Return to clinic in 6 months for reevaluation.  Call sooner if concerns arise.        Relevant Orders   CBC w/Diff   Vitamin D deficiency    Labs ordered today. Will make recommendations based on lab results.       Relevant Orders   Vitamin D (25 hydroxy)   Hypercholesteremia    Chronic.  Controlled with diet.  Labs ordered today.  Return to clinic in 6 months for reevaluation.  Call sooner if concerns arise.        Relevant Orders   Comp Met (CMET)   Lipid Profile     Follow up plan: Return in about 6 months (around 05/08/2022) for Physical and Fasting labs.

## 2021-11-07 NOTE — Assessment & Plan Note (Signed)
Chronic.  Controlled with diet. Labs ordered today.  Return to clinic in 6 months for reevaluation.  Call sooner if concerns arise.   

## 2021-11-07 NOTE — Assessment & Plan Note (Signed)
Controlled with Zyrtec. Refill sent today.

## 2021-11-07 NOTE — Assessment & Plan Note (Signed)
Labs ordered today.  Will make recommendations based on lab results. ?

## 2021-11-07 NOTE — Assessment & Plan Note (Signed)
Reassured patient, continue to monitor. 

## 2021-11-07 NOTE — Assessment & Plan Note (Signed)
Chronic.  Controlled.  Continue with current medication regimen of OTC b12..  Labs ordered today.  Return to clinic in 6 months for reevaluation.  Call sooner if concerns arise.

## 2021-11-07 NOTE — Assessment & Plan Note (Signed)
Found on CT 12/11/20. Stable. Labs ordered today. Will make recommendations based on lab results.  

## 2021-11-08 LAB — COMPREHENSIVE METABOLIC PANEL
ALT: 13 IU/L (ref 0–32)
AST: 20 IU/L (ref 0–40)
Albumin/Globulin Ratio: 2.1 (ref 1.2–2.2)
Albumin: 4.2 g/dL (ref 3.7–4.7)
Alkaline Phosphatase: 100 IU/L (ref 44–121)
BUN/Creatinine Ratio: 13 (ref 12–28)
BUN: 9 mg/dL (ref 8–27)
Bilirubin Total: 0.2 mg/dL (ref 0.0–1.2)
CO2: 26 mmol/L (ref 20–29)
Calcium: 9.2 mg/dL (ref 8.7–10.3)
Chloride: 105 mmol/L (ref 96–106)
Creatinine, Ser: 0.72 mg/dL (ref 0.57–1.00)
Globulin, Total: 2 g/dL (ref 1.5–4.5)
Glucose: 79 mg/dL (ref 70–99)
Potassium: 4.3 mmol/L (ref 3.5–5.2)
Sodium: 143 mmol/L (ref 134–144)
Total Protein: 6.2 g/dL (ref 6.0–8.5)
eGFR: 86 mL/min/{1.73_m2} (ref >59)

## 2021-11-08 LAB — CBC WITH DIFFERENTIAL/PLATELET
Basophils Absolute: 0 10*3/uL (ref 0.0–0.2)
Basos: 1 %
EOS (ABSOLUTE): 0.2 10*3/uL (ref 0.0–0.4)
Eos: 3 %
Hematocrit: 35.7 % (ref 34.0–46.6)
Hemoglobin: 11.7 g/dL (ref 11.1–15.9)
Immature Grans (Abs): 0 10*3/uL (ref 0.0–0.1)
Immature Granulocytes: 0 %
Lymphocytes Absolute: 2.1 10*3/uL (ref 0.7–3.1)
Lymphs: 31 %
MCH: 27.4 pg (ref 26.6–33.0)
MCHC: 32.8 g/dL (ref 31.5–35.7)
MCV: 84 fL (ref 79–97)
Monocytes Absolute: 0.5 10*3/uL (ref 0.1–0.9)
Monocytes: 8 %
Neutrophils Absolute: 3.8 10*3/uL (ref 1.4–7.0)
Neutrophils: 57 %
Platelets: 381 10*3/uL (ref 150–450)
RBC: 4.27 x10E6/uL (ref 3.77–5.28)
RDW: 14 % (ref 11.7–15.4)
WBC: 6.6 10*3/uL (ref 3.4–10.8)

## 2021-11-08 LAB — LIPID PANEL
Chol/HDL Ratio: 3.6 ratio (ref 0.0–4.4)
Cholesterol, Total: 182 mg/dL (ref 100–199)
HDL: 51 mg/dL (ref >39)
LDL Chol Calc (NIH): 114 mg/dL — ABNORMAL HIGH (ref 0–99)
Triglycerides: 91 mg/dL (ref 0–149)
VLDL Cholesterol Cal: 17 mg/dL (ref 5–40)

## 2021-11-08 LAB — VITAMIN D 25 HYDROXY (VIT D DEFICIENCY, FRACTURES): Vit D, 25-Hydroxy: 43.1 ng/mL (ref 30.0–100.0)

## 2021-11-08 NOTE — Progress Notes (Signed)
Hi [name].  Your lab work looks great.  Vitamin D is within normal range. Cholesterol is slightly elevated but I do not think medication is necessary at this time.  Follow up as discussed.

## 2021-11-13 ENCOUNTER — Encounter: Payer: Self-pay | Admitting: Physical Therapy

## 2021-11-13 ENCOUNTER — Ambulatory Visit: Payer: Medicare HMO | Attending: Family Medicine | Admitting: Physical Therapy

## 2021-11-13 DIAGNOSIS — M25511 Pain in right shoulder: Secondary | ICD-10-CM | POA: Diagnosis not present

## 2021-11-13 DIAGNOSIS — G8929 Other chronic pain: Secondary | ICD-10-CM | POA: Insufficient documentation

## 2021-11-13 NOTE — Therapy (Signed)
[facility] [address] [place], [address] Phone: [phone]   Fax:  [phone]  Physical Therapy Evaluation  Patient Details  Name: Kayla Johnson MRN: [id number] Date of Birth: [birth year] No data recorded  Encounter Date: 11/13/2021   PT End of Session - 11/13/21 1426     Visit Number 1    Number of Visits 17    Date for PT Re-Evaluation 01/10/22    Authorization - Visit Number 1    Progress Note Due on Visit 10    PT Start Time [phone]    PT Stop Time 1030    PT Time Calculation (min) 37 min    Activity Tolerance Patient tolerated treatment well    Behavior During Therapy [facility] for tasks assessed/performed              Past Medical History:  Diagnosis Date   Carpal tunnel syndrome    COVID-19 12/06/2020   reported by patient   GERD (gastroesophageal reflux disease)    Lichen sclerosus    Pernicious anemia    Shoulder bursitis     Past Surgical History:  Procedure Laterality Date   ABDOMINAL HYSTERECTOMY     ESOPHAGOGASTRODUODENOSCOPY (EGD) WITH PROPOFOL N/A 09/12/2020   Procedure: ESOPHAGOGASTRODUODENOSCOPY (EGD) WITH PROPOFOL;  Surgeon: Toledo, Benay Pike, MD;  Location: [facility] ENDOSCOPY;  Service: Gastroenterology;  Laterality: N/A;   Molar Pregnancy     S/P D & C   TONSILLECTOMY     Tubes & Ovaries removed  2015    There were no vitals filed for this visit.    Subjective Assessment - 11/13/21 0955     Pertinent History Pt is a 75 year old female with insidious onset of R shoulder pain about 2 months months, but reports she has head shoulder pain for several years. Reports this pain is from at base skull on R to top of R shoulder that is dull anc achy in nature, without numbess or tingling. Current and best pain level 0/10; worst 10/10. Aggravating pain factors including laying on R shoulder, lifting pack of of bottled water, carrying heavy things, repetitive overhead reaching. She  works part time at [employer], and has trouble pulling out the rug for her to stand on. Pt lives alone, is able to complete activities of dialy living, modifies as needed with shoulder. Pt denies N/V, B&B changes, unexplained weight fluctuation, saddle paresthesia, fever, night sweats, or unrelenting night pain at this time.    Limitations Lifting;House hold activities    How long can you sit comfortably? unlimited by shoulder    How long can you stand comfortably? unlimited by shoulder    How long can you walk comfortably? unlimited by shoulder    Currently in Pain? Yes    Pain Score 0-No pain    Pain Location Shoulder    Pain Orientation Right    Pain Descriptors / Indicators Aching;Dull    Pain Type Chronic pain    Pain Radiating Towards none    Pain Onset More than a month ago    Pain Frequency Intermittent    Aggravating Factors  laying on R shoulder, lifting, pushing/pulling    Pain Relieving Factors prayer and medication    Effect of Pain on Daily Activities unable to complete ADLs and work duties without pain                OBJECTIVE  MUSCULOSKELETAL: Tremor: Normal Bulk: Normal Tone: Normal  Cervical Screen AROM: WFL and painless with overpressure in all planes Spurlings A (ipsilateral lateral flexion/axial compression): R: Negative L: Negative Spurlings B (ipsilateral lateral flexion/contralateral rotation/axial compression): R: Negative L: Negative Repeated movement: No centralization or peripheralization with protraction or retraction Hoffman Sign (cervical cord compression): R: Negative L: Negative   Elbow Screen Elbow AROM:  lacking approx 20d of elbow ext bilat PROM full ext with valgus bilat  Palpation TTP at concordant pain site with R UT palapation, secondary pain site to levator scapulae. Latent trigger points to bilat bicep R>L  Strength R/L 5/5 Shoulder flexion (anterior deltoid/pec major/coracobrachialis, axillary n. (C5-6) and  musculocutaneous n. (C5-7)) 5/5 Shoulder abduction (deltoid/supraspinatus, axillary/suprascapular n, C5) 5/5 Shoulder external rotation (infraspinatus/teres minor) 5/5 Shoulder internal rotation (subcapularis/lats/pec major) 5/5 Shoulder extension (posterior deltoid, lats, teres major, axillary/thoracodorsal n.) 5/5 Shoulder horizontal abduction 5/5 Elbow flexion (biceps brachii, brachialis, brachioradialis, musculoskeletal n, C5-6) 5/5 Elbow extension (triceps, radial n, C7) 5/5 Latissimus 3+/3+ Y lower trap 4-/4- T scapular retractors  AROM R/L 180/180 Shoulder flexion 180/180 Shoulder abduction C7/C7 Shoulder external rotation T12/T10 Shoulder internal rotation 60/60 Shoulder extension  *Indicates pain, overpressure performed unless otherwise indicated  PROM R/L 180/180 Shoulder flexion 180/180 Shoulder abduction 90/90 Shoulder external rotation 70/70 Shoulder internal rotation 60/60 Shoulder extension *Indicates pain, overpressure performed unless otherwise indicated  Accessory Motions/Glides Glenohumeral: Posterior: R: normal L: normal Inferior: R: normal L: normal Anterior: R: normal L: normal  Acromioclavicular:  Posterior: R: normal L: normal Anterior: R: normal L: normal  Sternoclavicular: Posterior: R: normal L: normal Anterior: R: normal L: normal Superior: R: normal L: normal Inferior: R: normal L: normal  Scapulothoracic: Distraction: R: normal L: normal Medial: R: normal L: normal Lateral: R: normal L: normal Inferior: R: normal L: normal Superior: R: normal L: normal  Muscle Length Testing Pectoralis Major: R: normal L: normal Pectoralis Minor: R: normal L: normal Biceps: R: abnormal L: abnormal  NEUROLOGICAL:  Sensation Grossly intact to light touch bilateral UE as determined by testing dermatomes C2-T2 Proprioception and hot/cold testing deferred on this date  SPECIAL TESTS  Rotator Cuff  Drop Arm Test: Negative Painful Arc (Pain  from 60 to 120 degrees scaption): Negative Infraspinatus Muscle Test: Negative If all 3 tests positive, the probability of a full-thickness rotator cuff tear is 91%  Subacromial Impingement Hawkins-Kennedy: Negative Neer (Block scapula, PROM flexion): Negative Painful Arc (Pain from 60 to 120 degrees scaption): Negative Empty Can: Positive External Rotation Resistance: Negative  Labral Tear Biceps Load II (120 elevation, full ER, 90 elbow flexion, full supination, resisted elbow flexion): Negative Crank (160 scaption, axial load with IR/ER): Negative Active Compression Test: Negative  Bicep Tendon Pathology Speed (shoulder flexion to 90, external rotation, full elbow extension, and forearm supination with resistance: Negative Yergason's (resisted shoulder ER and supination/biceps tendon pathology): Negative  Shoulder Instability Sulcus Sign: Negative Anterior Apprehension: Negative  Beighton scale  LEFT  RIGHT           1. Passive dorsiflexion and hyperextension of the fifth MCP joint beyond 90  0 0   2. Passive apposition of the thumb to the flexor aspect of the forearm  0  0   3. Passive hyperextension of the elbow beyond 10  0  0   4. Passive hyperextension of the knee beyond 10  0  0   5. Active forward flexion of the trunk with the knees fully extended so that the palms of the hands rest flat on the floor   0  TOTAL         0/ 9    Ther-Ex PT reviewed the following HEP with patient with patient able to demonstrate a set of the following with min cuing for correction needed. PT educated patient on parameters of therex (how/when to inc/decrease intensity, frequency, rep/set range, stretch hold time, and purpose of therex) with verbalized understanding.   Levator stretch 30-60sec 3x/day UT stretch 30-60sec 3x/day                      Objective measurements completed on examination: See above findings.                PT Education -  11/13/21 1006     Education Details Patient was educated on diagnosis, anatomy and pathology involved, prognosis, role of PT, and was given an HEP, demonstrating exercise with proper form following verbal and tactile cues, and was given a paper hand out to continue exercise at home. Pt was educated on and agreed to plan of care.    Person(s) Educated Patient    Methods Explanation;Demonstration;Verbal cues;Handout    Comprehension Verbalized understanding;Verbal cues required;Returned demonstration              PT Short Term Goals - 11/13/21 1448       PT SHORT TERM GOAL #1   Title Pt will be independent with HEP in order to improve strength and decrease pain in order to improve pain-free function at home and work.    Baseline 11/13/21 HEP given    Time 4    Period Weeks    Status New               PT Long Term Goals - 11/13/21 1449       PT LONG TERM GOAL #1   Title Pt will decrease worst pain as reported on NPRS by at least 3 points in order to demonstrate clinically significant reduction in pain.    Baseline 11/13/21 10/10    Time 8    Period Weeks    Status New      PT LONG TERM GOAL #2   Title Pt will demonstrate periscapular MMT strength of at least 4/5 bilat in order to complete heavy household ADLs    Baseline 11/13/21 Y lower trap 3+/5 bilat; T scapular retractors 4-/5 bilat    Time 8    Period Weeks    Status New      PT LONG TERM GOAL #3   Title Patient will increase FOTO score to 67 to demonstrate predicted increase in functional mobility to complete ADLs    Baseline 11/13/21 45    Time 8    Period Weeks    Status New                    Plan - 11/13/21 1427     Clinical Impression Statement Pt is a 75 year old female presetnign with an acute on chronic episode of R shoulder pain, starting insideously 2 months ago. Examination negative for RTC tear, labrum pathology, cervical rediculopathy, and mostly negative for subacromial impingement.  Patient with increased tension of R UT and levator scapule, seeming to be the most related to the pain she is experienceing. Impairemnts in decreased periscapular strenght, increased tone and trigger points to R UT and levator scapula, abnormal posture, decreased scapulohumeral rhythm and pain. Activity limitations in carrying, heavy lifting, pushing and pulling; inhibiting full participation in ADLs and job  greeting at [employer].    Personal Factors and Comorbidities Comorbidity 1;Age;Past/Current Experience;Time since onset of injury/illness/exacerbation;Profession;Fitness    Comorbidities GERD    Examination-Activity Limitations Reach Overhead;Lift;Carry;Dressing    Examination-Participation Restrictions Occupation;Laundry;Cleaning;Community Activity;Driving;Meal Prep    Stability/Clinical Decision Making Evolving/Moderate complexity    Clinical Decision Making Moderate    Rehab Potential Good    PT Frequency 2x / week    PT Duration 8 weeks    PT Treatment/Interventions ADLs/Self Care Home Management;Iontophoresis 4mg /ml Dexamethasone;Therapeutic exercise;Passive range of motion;Spinal Manipulations;Joint Manipulations;Electrical Stimulation;Moist Heat;Traction;Cryotherapy;Ultrasound;Therapeutic activities;Neuromuscular re-education;DME Instruction;Patient/family education;Manual techniques;Dry needling;Taping    PT Next Visit Plan postural education, periscapular strengthening    PT Home Exercise Plan UT and levator stretch    Consulted and Agree with Plan of Care Patient             Patient will benefit from skilled therapeutic intervention in order to improve the following deficits and impairments:  Postural dysfunction, Pain, Improper body mechanics, Increased fascial restricitons, Impaired UE functional use, Impaired flexibility, Impaired tone, Decreased coordination, Decreased mobility, Decreased endurance, Decreased activity tolerance, Decreased range of motion, Decreased  strength  Visit Diagnosis: Chronic right shoulder pain     Problem List Patient Active Problem List   Diagnosis Date Noted   Aortic atherosclerosis ([place]) 11/07/2021   Hypercholesteremia 02/20/2021   Senile purpura ([place]) 02/20/2021   Vitamin D deficiency 08/07/2020   Epigastric pain 08/07/2020   Advanced care planning/counseling discussion 04/14/2018   Shoulder impingement syndrome, right 21/74/7159   Lichen sclerosus of female genitalia 12/31/2016   Bilateral tinnitus 12/12/2015   Allergic rhinitis 12/12/2015   Pernicious anemia 10/22/2015   Durwin Reges DPT Durwin Reges, PT 11/13/2021, 3:19 PM  [facility] [address], [place], [address] Phone: [phone]   Fax:  [phone]  Name: Kayla Johnson MRN: [id number] Date of Birth: February 21, 1946

## 2021-11-20 ENCOUNTER — Telehealth: Payer: Self-pay | Admitting: Nurse Practitioner

## 2021-11-20 ENCOUNTER — Ambulatory Visit: Payer: Medicare HMO | Admitting: Physical Therapy

## 2021-11-20 ENCOUNTER — Encounter: Payer: Self-pay | Admitting: Physical Therapy

## 2021-11-20 DIAGNOSIS — G8929 Other chronic pain: Secondary | ICD-10-CM | POA: Diagnosis not present

## 2021-11-20 DIAGNOSIS — M25511 Pain in right shoulder: Secondary | ICD-10-CM | POA: Diagnosis not present

## 2021-11-20 NOTE — Telephone Encounter (Signed)
Copied from [place] [id number]. Topic: Medicare AWV >> Nov 20, 2021  4:07 PM Lavonia Drafts wrote: Reason for CRM: Left message for patient to call back and schedule the Medicare Annual Wellness Visit (AWV) virtually or by telephone.  Last AWV 04/14/18  Please schedule at anytime with [facility]-Nurse Health Advisor.  45 minute appointment  Any questions, please call me at [phone]

## 2021-11-20 NOTE — Therapy (Signed)
[facility] [address] [place], [address] Phone: [phone]   Fax:  [phone]  Physical Therapy Treatment  Patient Details  Name: Kayla Johnson MRN: [id number] Date of Birth: July 17, 1946 Referring Provider (PT): Park Liter DO   Encounter Date: 11/20/2021   PT End of Session - 11/20/21 1128     Visit Number 2    Number of Visits 17    Date for PT Re-Evaluation 01/10/22    Authorization - Visit Number 2    Progress Note Due on Visit 10    PT Start Time 1118    PT Stop Time 1156    PT Time Calculation (min) 38 min    Activity Tolerance Patient tolerated treatment well    Behavior During Therapy [facility] for tasks assessed/performed             Past Medical History:  Diagnosis Date   Carpal tunnel syndrome    COVID-19 12/06/2020   reported by patient   GERD (gastroesophageal reflux disease)    Lichen sclerosus    Pernicious anemia    Shoulder bursitis     Past Surgical History:  Procedure Laterality Date   ABDOMINAL HYSTERECTOMY     ESOPHAGOGASTRODUODENOSCOPY (EGD) WITH PROPOFOL N/A 09/12/2020   Procedure: ESOPHAGOGASTRODUODENOSCOPY (EGD) WITH PROPOFOL;  Surgeon: Toledo, Benay Pike, MD;  Location: [facility] ENDOSCOPY;  Service: Gastroenterology;  Laterality: N/A;   Molar Pregnancy     S/P D & C   TONSILLECTOMY     Tubes & Ovaries removed  2015    There were no vitals filed for this visit.   Subjective Assessment - 11/20/21 1123     Subjective Pt reports she has completing HEP stretching and feels a little sore from this, but reports feeling a good stretch. Still having some soreness with overhead motion, reports no pain, just some soreness currently.    Pertinent History Pt is a 75 year old female with insidious onset of R shoulder pain about 2 months months, but reports she has head shoulder pain for several years. Reports this pain is from at base skull on R to top of R shoulder that is  dull anc achy in nature, without numbess or tingling. Current and best pain level 0/10; worst 10/10. Aggravating pain factors including laying on R shoulder, lifting pack of of bottled water, carrying heavy things, repetitive overhead reaching. She works part time at [employer], and has trouble pulling out the rug for her to stand on. Pt lives alone, is able to complete activities of dialy living, modifies as needed with shoulder. Pt denies N/V, B&B changes, unexplained weight fluctuation, saddle paresthesia, fever, night sweats, or unrelenting night pain at this time.    Limitations Lifting;House hold activities    How long can you sit comfortably? unlimited by shoulder    How long can you stand comfortably? unlimited by shoulder    How long can you walk comfortably? unlimited by shoulder    Pain Onset More than a month ago            Ther-Ex UBE 37mins fwd; 29mins backward L2 with min cuing for increased speed  Prone scapular depression + retraction + shoulder ext 3x 10 with max cuing on initial set for depression rhythm with good carry over  Seated bilat scapular retraction + shoulder ER RTB 3x 10 with good carry over of initial demo and eccentric control   Seated bilat overhead scaption x12 with  good carry over of demo and cuing for scapulohumeral rhythm with With RTB 2x 10 with good carry over of technique  Education on carry over of therex on functional reaching and lifting with good understanding  Levator stretch 30sec bilat UT stretch 30sec bilat            PT Education - 11/20/21 1125     Education Details therex form/technique, HEP review    Person(s) Educated Patient    Methods Explanation;Demonstration;Verbal cues    Comprehension Verbalized understanding;Returned demonstration;Verbal cues required              PT Short Term Goals - 11/13/21 1448       PT SHORT TERM GOAL #1   Title Pt will be independent with HEP in order to improve strength and  decrease pain in order to improve pain-free function at home and work.    Baseline 11/13/21 HEP given    Time 4    Period Weeks    Status New               PT Long Term Goals - 11/13/21 1449       PT LONG TERM GOAL #1   Title Pt will decrease worst pain as reported on NPRS by at least 3 points in order to demonstrate clinically significant reduction in pain.    Baseline 11/13/21 10/10    Time 8    Period Weeks    Status New      PT LONG TERM GOAL #2   Title Pt will demonstrate periscapular MMT strength of at least 4/5 bilat in order to complete heavy household ADLs    Baseline 11/13/21 Y lower trap 3+/5 bilat; T scapular retractors 4-/5 bilat    Time 8    Period Weeks    Status New      PT LONG TERM GOAL #3   Title Patient will increase FOTO score to 67 to demonstrate predicted increase in functional mobility to complete ADLs    Baseline 11/13/21 45    Time 8    Period Weeks    Status New                   Plan - 11/20/21 1137     Clinical Impression Statement PT initiated therex progression for increased scapular retraction and depression activation/strengthening, and decreased scapular protraction and elevation activation with success. Patient is able to comply with cuing for proper technique of therex with good understanding of motor control of scapulohumeral rhythm with understanding of carry over into functional overhead reaching and lifting. Patient with good motivation and no increased pain throughout session. PT will continue progression as able.    Personal Factors and Comorbidities Comorbidity 1;Age;Past/Current Experience;Time since onset of injury/illness/exacerbation;Profession;Fitness    Comorbidities GERD    Examination-Activity Limitations Reach Overhead;Lift;Carry;Dressing    Examination-Participation Restrictions Occupation;Laundry;Cleaning;Community Activity;Driving;Meal Prep    Stability/Clinical Decision Making Evolving/Moderate complexity     Clinical Decision Making Moderate    Rehab Potential Good    PT Frequency 2x / week    PT Duration 8 weeks    PT Treatment/Interventions ADLs/Self Care Home Management;Iontophoresis 4mg /ml Dexamethasone;Therapeutic exercise;Passive range of motion;Spinal Manipulations;Joint Manipulations;Electrical Stimulation;Moist Heat;Traction;Cryotherapy;Ultrasound;Therapeutic activities;Neuromuscular re-education;DME Instruction;Patient/family education;Manual techniques;Dry needling;Taping    PT Next Visit Plan postural education, periscapular strengthening    PT Home Exercise Plan UT and levator stretch    Consulted and Agree with Plan of Care Patient  Patient will benefit from skilled therapeutic intervention in order to improve the following deficits and impairments:  Postural dysfunction, Pain, Improper body mechanics, Increased fascial restricitons, Impaired UE functional use, Impaired flexibility, Impaired tone, Decreased coordination, Decreased mobility, Decreased endurance, Decreased activity tolerance, Decreased range of motion, Decreased strength  Visit Diagnosis: Chronic right shoulder pain     Problem List Patient Active Problem List   Diagnosis Date Noted   Aortic atherosclerosis ([place]) 11/07/2021   Hypercholesteremia 02/20/2021   Senile purpura ([place]) 02/20/2021   Vitamin D deficiency 08/07/2020   Epigastric pain 08/07/2020   Advanced care planning/counseling discussion 04/14/2018   Shoulder impingement syndrome, right 45/62/5638   Lichen sclerosus of female genitalia 12/31/2016   Bilateral tinnitus 12/12/2015   Allergic rhinitis 12/12/2015   Pernicious anemia 10/22/2015   Durwin Reges DPT Durwin Reges, PT 11/20/2021, 11:55 AM  [place] [facility] [address], [place], [address] Phone: [phone]   Fax:  [phone]  Name: Kayla Johnson MRN: [id number] Date of Birth:  03-Sep-1946

## 2021-11-27 ENCOUNTER — Ambulatory Visit: Payer: Medicare HMO | Admitting: Physical Therapy

## 2021-11-27 ENCOUNTER — Encounter: Payer: Self-pay | Admitting: Physical Therapy

## 2021-11-27 DIAGNOSIS — G8929 Other chronic pain: Secondary | ICD-10-CM | POA: Diagnosis not present

## 2021-11-27 DIAGNOSIS — M25511 Pain in right shoulder: Secondary | ICD-10-CM

## 2021-11-27 NOTE — Therapy (Signed)
[place] [facility] [address] [place], [address] Phone: [phone]   Fax:  [phone]  Physical Therapy Treatment  Patient Details  Name: Kayla Johnson MRN: [id number] Date of Birth: Feb 11, 1946 Referring Provider (PT): Park Liter DO   Encounter Date: 11/27/2021   PT End of Session - 11/27/21 1058     Visit Number 3    Number of Visits 17    Date for PT Re-Evaluation 01/10/22    Authorization - Visit Number 3    Progress Note Due on Visit 10    PT Start Time 1039    PT Stop Time 1117    PT Time Calculation (min) 38 min    Activity Tolerance Patient tolerated treatment well    Behavior During Therapy [facility] for tasks assessed/performed             Past Medical History:  Diagnosis Date   Carpal tunnel syndrome    COVID-19 12/06/2020   reported by patient   GERD (gastroesophageal reflux disease)    Lichen sclerosus    Pernicious anemia    Shoulder bursitis     Past Surgical History:  Procedure Laterality Date   ABDOMINAL HYSTERECTOMY     ESOPHAGOGASTRODUODENOSCOPY (EGD) WITH PROPOFOL N/A 09/12/2020   Procedure: ESOPHAGOGASTRODUODENOSCOPY (EGD) WITH PROPOFOL;  Surgeon: Toledo, Benay Pike, MD;  Location: [facility] ENDOSCOPY;  Service: Gastroenterology;  Laterality: N/A;   Molar Pregnancy     S/P D & C   TONSILLECTOMY     Tubes & Ovaries removed  2015    There were no vitals filed for this visit.   Subjective Assessment - 11/27/21 1042     Subjective Continues to report no pain, only soreness. Reports she feels 75% better overall, still thinks she has trouble lifting/pulling heavy things (such as her cases of water, and her mat out at work that she stands on), feels mostly limited by weakness at this point    Pertinent History Pt is a 75 year old female with insidious onset of R shoulder pain about 2 months months, but reports she has head shoulder pain for several years. Reports this pain is from at  base skull on R to top of R shoulder that is dull anc achy in nature, without numbess or tingling. Current and best pain level 0/10; worst 10/10. Aggravating pain factors including laying on R shoulder, lifting pack of of bottled water, carrying heavy things, repetitive overhead reaching. She works part time at [employer], and has trouble pulling out the rug for her to stand on. Pt lives alone, is able to complete activities of dialy living, modifies as needed with shoulder. Pt denies N/V, B&B changes, unexplained weight fluctuation, saddle paresthesia, fever, night sweats, or unrelenting night pain at this time.    Limitations Lifting;House hold activities    How long can you sit comfortably? unlimited by shoulder    How long can you stand comfortably? unlimited by shoulder    How long can you walk comfortably? unlimited by shoulder    Pain Onset More than a month ago               Ther-Ex UBE 26mins fwd; 95mins backward L2 with min cuing for increased speed   Standing rows 10# 2x 10 with cuing for scapular retraction with good carry over  Bent over omega rows 10# 2x 10 with good carry over of set up technique, education for using this technique to pull  her mat out at work, as opposed to forward trunk lean pulling up with shoulder hiking; verbalized understanding  Lifting 29# box from chair with carry 18ft to place down x3 reps with rest breaks between each *prior to attempt demo and cuing through proper lifting and lowering techniques with excellent carry over by 3rd rep with little to no cuing (demo and max VC throguh first rep) to simulate lifting and carrying waters into her home  Bent over row + stand 20# 2x 5 with education on lifting technique to "pull object toward you, then use your LEs to stand up with it and control lower by keeping close to you for squat and then placing down to ground"   UT stretch 30sec bilat                           PT  Education - 11/27/21 1049     Education Details therex form/technique    Person(s) Educated Patient    Methods Explanation;Demonstration;Verbal cues    Comprehension Verbalized understanding;Returned demonstration;Verbal cues required              PT Short Term Goals - 11/13/21 1448       PT SHORT TERM GOAL #1   Title Pt will be independent with HEP in order to improve strength and decrease pain in order to improve pain-free function at home and work.    Baseline 11/13/21 HEP given    Time 4    Period Weeks    Status New               PT Long Term Goals - 11/13/21 1449       PT LONG TERM GOAL #1   Title Pt will decrease worst pain as reported on NPRS by at least 3 points in order to demonstrate clinically significant reduction in pain.    Baseline 11/13/21 10/10    Time 8    Period Weeks    Status New      PT LONG TERM GOAL #2   Title Pt will demonstrate periscapular MMT strength of at least 4/5 bilat in order to complete heavy household ADLs    Baseline 11/13/21 Y lower trap 3+/5 bilat; T scapular retractors 4-/5 bilat    Time 8    Period Weeks    Status New      PT LONG TERM GOAL #3   Title Patient will increase FOTO score to 67 to demonstrate predicted increase in functional mobility to complete ADLs    Baseline 11/13/21 45    Time 8    Period Weeks    Status New                   Plan - 11/27/21 1108     Clinical Impression Statement PT continued therex progression for increased periscapular strengthening with focus on functional lifting/carrying with success. Patient is able to comply with all cuing for proper technique of therex and lifting techniques with excellent understanding on how to use them in ADLs. PT will continue progression as able.    Personal Factors and Comorbidities Comorbidity 1;Age;Past/Current Experience;Time since onset of injury/illness/exacerbation;Profession;Fitness    Comorbidities GERD    Examination-Activity  Limitations Reach Overhead;Lift;Carry;Dressing    Examination-Participation Restrictions Occupation;Laundry;Cleaning;Community Activity;Driving;Meal Prep    Stability/Clinical Decision Making Evolving/Moderate complexity    Clinical Decision Making Moderate    Rehab Potential Good    PT Frequency 2x / week  PT Duration 8 weeks    PT Treatment/Interventions ADLs/Self Care Home Management;Iontophoresis 4mg /ml Dexamethasone;Therapeutic exercise;Passive range of motion;Spinal Manipulations;Joint Manipulations;Electrical Stimulation;Moist Heat;Traction;Cryotherapy;Ultrasound;Therapeutic activities;Neuromuscular re-education;DME Instruction;Patient/family education;Manual techniques;Dry needling;Taping    PT Next Visit Plan postural education, periscapular strengthening    PT Home Exercise Plan UT and levator stretch    Consulted and Agree with Plan of Care Patient             Patient will benefit from skilled therapeutic intervention in order to improve the following deficits and impairments:  Postural dysfunction, Pain, Improper body mechanics, Increased fascial restricitons, Impaired UE functional use, Impaired flexibility, Impaired tone, Decreased coordination, Decreased mobility, Decreased endurance, Decreased activity tolerance, Decreased range of motion, Decreased strength  Visit Diagnosis: Chronic right shoulder pain     Problem List Patient Active Problem List   Diagnosis Date Noted   Aortic atherosclerosis ([place]) 11/07/2021   Hypercholesteremia 02/20/2021   Senile purpura ([place]) 02/20/2021   Vitamin D deficiency 08/07/2020   Epigastric pain 08/07/2020   Advanced care planning/counseling discussion 04/14/2018   Shoulder impingement syndrome, right 41/96/2229   Lichen sclerosus of female genitalia 12/31/2016   Bilateral tinnitus 12/12/2015   Allergic rhinitis 12/12/2015   Pernicious anemia 10/22/2015   Durwin Reges DPT Durwin Reges, PT 11/27/2021, 11:43 AM  [place] [facility] [address] [place], [address] Phone: [phone]   Fax:  [phone]  Name: Kayla Johnson MRN: [id number] Date of Birth: [birth year]

## 2021-12-04 ENCOUNTER — Encounter: Payer: Medicare HMO | Admitting: Physical Therapy

## 2021-12-11 ENCOUNTER — Ambulatory Visit: Payer: Medicare HMO | Admitting: Physical Therapy

## 2021-12-18 ENCOUNTER — Ambulatory Visit: Payer: Medicare HMO | Admitting: Physical Therapy

## 2021-12-24 DIAGNOSIS — H6502 Acute serous otitis media, left ear: Secondary | ICD-10-CM | POA: Diagnosis not present

## 2021-12-24 DIAGNOSIS — J0100 Acute maxillary sinusitis, unspecified: Secondary | ICD-10-CM | POA: Diagnosis not present

## 2021-12-24 DIAGNOSIS — R6889 Other general symptoms and signs: Secondary | ICD-10-CM | POA: Diagnosis not present

## 2021-12-24 DIAGNOSIS — Z03818 Encounter for observation for suspected exposure to other biological agents ruled out: Secondary | ICD-10-CM | POA: Diagnosis not present

## 2022-01-03 ENCOUNTER — Telehealth: Payer: Self-pay | Admitting: Nurse Practitioner

## 2022-01-03 NOTE — Telephone Encounter (Signed)
Copied from [place] [phone]. Topic: Medicare AWV >> Jan 03, 2022  3:19 PM Lavonia Drafts wrote: Reason for CRM:  Left message for patient to call back and schedule the Medicare Annual Wellness Visit (AWV) virtually or by telephone.  Last AWV 04/14/18  Please schedule at anytime with [facility]Nurse Health Advisor.  45 minute appointment  Any questions, please call me at [phone]

## 2022-02-04 ENCOUNTER — Ambulatory Visit
Admission: RE | Admit: 2022-02-04 | Discharge: 2022-02-04 | Disposition: A | Discharge status: Home / Self Care | Payer: Medicare HMO | Source: Ambulatory Visit | Attending: Nurse Practitioner | Admitting: Nurse Practitioner

## 2022-02-04 ENCOUNTER — Ambulatory Visit (INDEPENDENT_AMBULATORY_CARE_PROVIDER_SITE_OTHER): Payer: Medicare HMO | Admitting: Nurse Practitioner

## 2022-02-04 ENCOUNTER — Encounter: Payer: Self-pay | Admitting: Nurse Practitioner

## 2022-02-04 ENCOUNTER — Other Ambulatory Visit: Payer: Self-pay

## 2022-02-04 ENCOUNTER — Ambulatory Visit
Admission: RE | Admit: 2022-02-04 | Discharge: 2022-02-04 | Disposition: A | Discharge status: Home / Self Care | Payer: Medicare HMO | Source: Home / Self Care | Attending: Nurse Practitioner | Admitting: Nurse Practitioner

## 2022-02-04 VITALS — BP 108/60 | HR 85 | Temp 98.3°F | Wt 110.2 lb

## 2022-02-04 DIAGNOSIS — M25562 Pain in left knee: Secondary | ICD-10-CM | POA: Insufficient documentation

## 2022-02-04 DIAGNOSIS — M1712 Unilateral primary osteoarthritis, left knee: Secondary | ICD-10-CM | POA: Diagnosis not present

## 2022-02-04 MED ORDER — DICLOFENAC SODIUM 1 % EX GEL: 2.0000 g | Freq: Four times a day (QID) | CUTANEOUS | 1 refills | Status: DC

## 2022-02-04 NOTE — Progress Notes (Signed)
? ?BP 108/60   Pulse 85   Temp 98.3 ?F (36.8 ?C) (Oral)   Wt 110 lb 3.2 oz (50 kg)   SpO2 97%   BMI 20.15 kg/m?   ? ?Subjective:  ? ? Patient ID: Kayla Johnson, female    DOB: [birth year], 76 y.o.   MRN: [id number] ? ?HPI: ?Kayla Johnson is a 76 y.o. female ? ?Chief Complaint  ?Patient presents with  ? Knee Pain  ?  Pt states she has been having knee pains in her L knee for a while. States since last week, she has been having sharp pains in the knee when walking.   ? ?KNEE PAIN ?Duration: days (since last Thursday) ?Involved knee: left ?Mechanism of injury: unknown ?Location:lateral ?Onset: sudden ?Severity: 10/10  ?Quality:  stabbing ?Frequency: intermittent- worse when walking. Only lasts a few seconds ?Radiation: no ?Aggravating factors: weight bearing and walking  ?Alleviating factors: brace  ?Status: worse ?Treatments attempted: none  ?Relief with NSAIDs?:  no ?Weakness with weight bearing or walking: yes ?Sensation of giving way: yes ?Locking: no ?Popping: no ?Bruising: no ?Swelling: no ?Redness: no ?Paresthesias/decreased sensation: no ?Fevers: no ? ?Relevant past medical, surgical, family and social history reviewed and updated as indicated. Interim medical history since our last visit reviewed. ?Allergies and medications reviewed and updated. ? ?Review of Systems  ?Musculoskeletal:   ?     Left knee pain  ? ?Per HPI unless specifically indicated above ? ?   ?Objective:  ?  ?BP 108/60   Pulse 85   Temp 98.3 ?F (36.8 ?C) (Oral)   Wt 110 lb 3.2 oz (50 kg)   SpO2 97%   BMI 20.15 kg/m?   ?Wt Readings from Last 3 Encounters:  ?02/04/22 110 lb 3.2 oz (50 kg)  ?11/07/21 113 lb 9.6 oz (51.5 kg)  ?10/23/21 109 lb 9.6 oz (49.7 kg)  ?  ?Physical Exam ?Vitals and nursing note reviewed.  ?Constitutional:   ?   General: She is not in acute distress. ?   Appearance: Normal appearance. She is normal weight. She is not ill-appearing, toxic-appearing or diaphoretic.  ?HENT:  ?   Head: Normocephalic.  ?   Right Ear:  External ear normal.  ?   Left Ear: External ear normal.  ?   Nose: Nose normal.  ?   Mouth/Throat:  ?   Mouth: Mucous membranes are moist.  ?   Pharynx: Oropharynx is clear.  ?Eyes:  ?   General:     ?   Right eye: No discharge.     ?   Left eye: No discharge.  ?   Extraocular Movements: Extraocular movements intact.  ?   Conjunctiva/sclera: Conjunctivae normal.  ?   Pupils: Pupils are equal, round, and reactive to light.  ?Cardiovascular:  ?   Rate and Rhythm: Normal rate and regular rhythm.  ?   Heart sounds: No murmur heard. ?Pulmonary:  ?   Effort: Pulmonary effort is normal. No respiratory distress.  ?   Breath sounds: Normal breath sounds. No wheezing or rales.  ?Musculoskeletal:  ?   Cervical back: Normal range of motion and neck supple.  ?   Right knee: Normal.  ?   Left knee: Normal range of motion. Tenderness present over the lateral joint line and patellar tendon.  ?Skin: ?   General: Skin is warm and dry.  ?   Capillary Refill: Capillary refill takes less than 2 seconds.  ?Neurological:  ?   General: No  focal deficit present.  ?   Mental Status: She is alert and oriented to person, place, and time. Mental status is at baseline.  ?Psychiatric:     ?   Mood and Affect: Mood normal.     ?   Behavior: Behavior normal.     ?   Thought Content: Thought content normal.     ?   Judgment: Judgment normal.  ? ? ?Results for orders placed or performed in visit on 11/07/21  ?Comp Met (CMET)  ?Result Value Ref Range  ? Glucose 79 70 - 99 mg/dL  ? BUN 9 8 - 27 mg/dL  ? Creatinine, Ser 0.72 0.57 - 1.00 mg/dL  ? eGFR 86 >59 mL/min/1.73  ? BUN/Creatinine Ratio 13 12 - 28  ? Sodium 143 134 - 144 mmol/L  ? Potassium 4.3 3.5 - 5.2 mmol/L  ? Chloride 105 96 - 106 mmol/L  ? CO2 26 20 - 29 mmol/L  ? Calcium 9.2 8.7 - 10.3 mg/dL  ? Total Protein 6.2 6.0 - 8.5 g/dL  ? Albumin 4.2 3.7 - 4.7 g/dL  ? Globulin, Total 2.0 1.5 - 4.5 g/dL  ? Albumin/Globulin Ratio 2.1 1.2 - 2.2  ? Bilirubin Total 0.2 0.0 - 1.2 mg/dL  ? Alkaline  Phosphatase 100 44 - 121 IU/L  ? AST 20 0 - 40 IU/L  ? ALT 13 0 - 32 IU/L  ?Lipid Profile  ?Result Value Ref Range  ? Cholesterol, Total 182 100 - 199 mg/dL  ? Triglycerides 91 0 - 149 mg/dL  ? HDL 51 >39 mg/dL  ? VLDL Cholesterol Cal 17 5 - 40 mg/dL  ? LDL Chol Calc (NIH) 114 (H) 0 - 99 mg/dL  ? Chol/HDL Ratio 3.6 0.0 - 4.4 ratio  ?Vitamin D (25 hydroxy)  ?Result Value Ref Range  ? Vit D, 25-Hydroxy 43.1 30.0 - 100.0 ng/mL  ?CBC w/Diff  ?Result Value Ref Range  ? WBC 6.6 3.4 - 10.8 x10E3/uL  ? RBC 4.27 3.77 - 5.28 x10E6/uL  ? Hemoglobin 11.7 11.1 - 15.9 g/dL  ? Hematocrit 35.7 34.0 - 46.6 %  ? MCV 84 79 - 97 fL  ? MCH 27.4 26.6 - 33.0 pg  ? MCHC 32.8 31.5 - 35.7 g/dL  ? RDW 14.0 11.7 - 15.4 %  ? Platelets 381 150 - 450 x10E3/uL  ? Neutrophils 57 Not Estab. %  ? Lymphs 31 Not Estab. %  ? Monocytes 8 Not Estab. %  ? Eos 3 Not Estab. %  ? Basos 1 Not Estab. %  ? Neutrophils Absolute 3.8 1.4 - 7.0 x10E3/uL  ? Lymphocytes Absolute 2.1 0.7 - 3.1 x10E3/uL  ? Monocytes Absolute 0.5 0.1 - 0.9 x10E3/uL  ? EOS (ABSOLUTE) 0.2 0.0 - 0.4 x10E3/uL  ? Basophils Absolute 0.0 0.0 - 0.2 x10E3/uL  ? Immature Granulocytes 0 Not Estab. %  ? Immature Grans (Abs) 0.0 0.0 - 0.1 x10E3/uL  ? ?   ?Assessment & Plan:  ? ?Problem List Items Addressed This Visit   ?None ?Visit Diagnoses   ? ? Acute pain of left knee    -  Primary  ? Will obtain xray. Suspect arthritis due age and prolonged standing at her job. Will make recommendations based on xray results. Voltaren gel given for pain.  ? Relevant Orders  ? DG Knee Complete 4 Views Left  ? ?  ?  ? ?Follow up plan: ?Return if symptoms worsen or fail to improve. ? ? ? ? ? ?

## 2022-02-05 NOTE — Progress Notes (Signed)
Please let patient know that she has some arthritis in her knee that is likely causing her pain.  We can do a knee injection in the office if she would like.   ? ?Do not schedule for next week.

## 2022-02-28 IMAGING — CR DG CHEST 2V
2 series · 2 of 2 positions shown · non-contrast
Comparison: None.

CLINICAL DATA: 74-year-old female with lower abdominal pain and
COVID positive

EXAM:
CHEST - 2 VIEW

[chest pa]
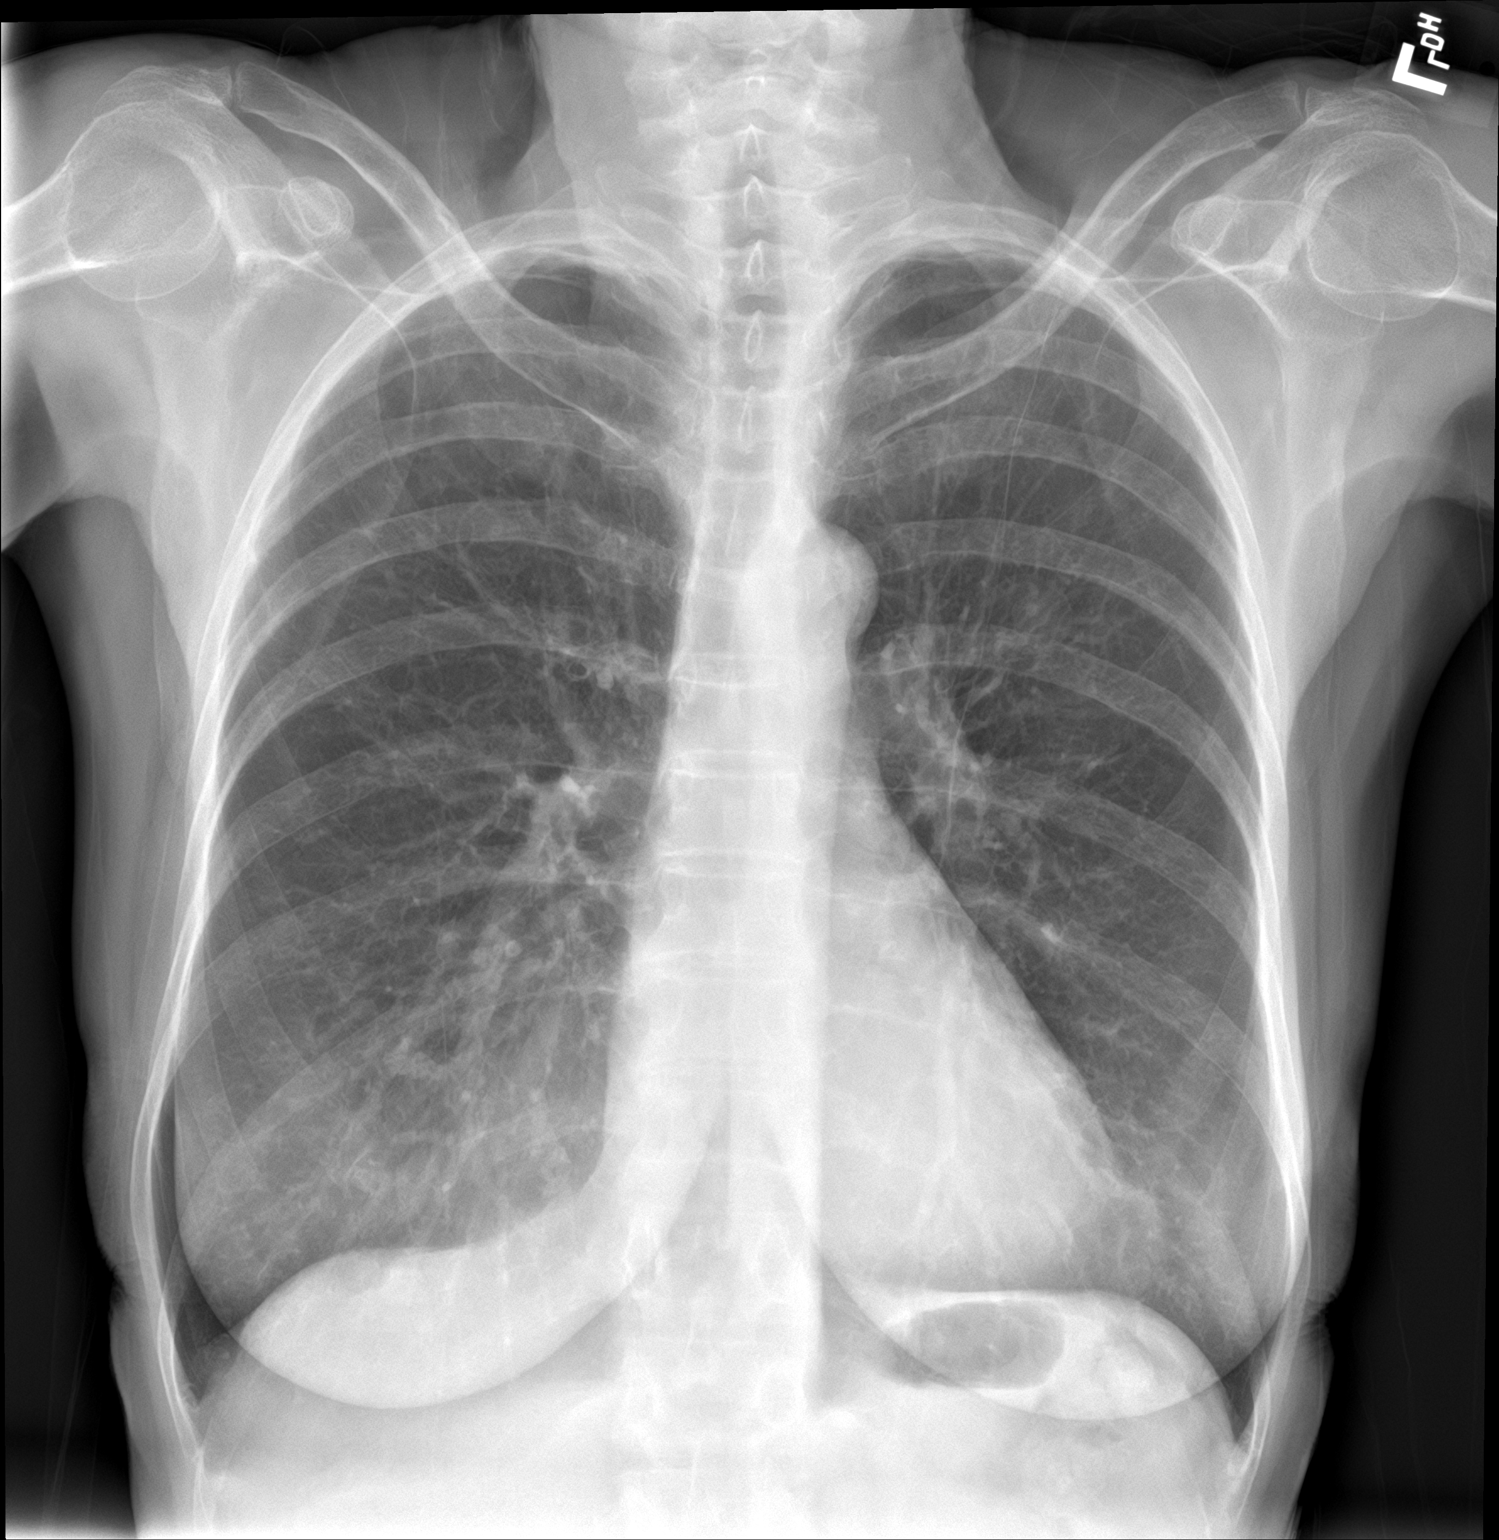

[chest lat]
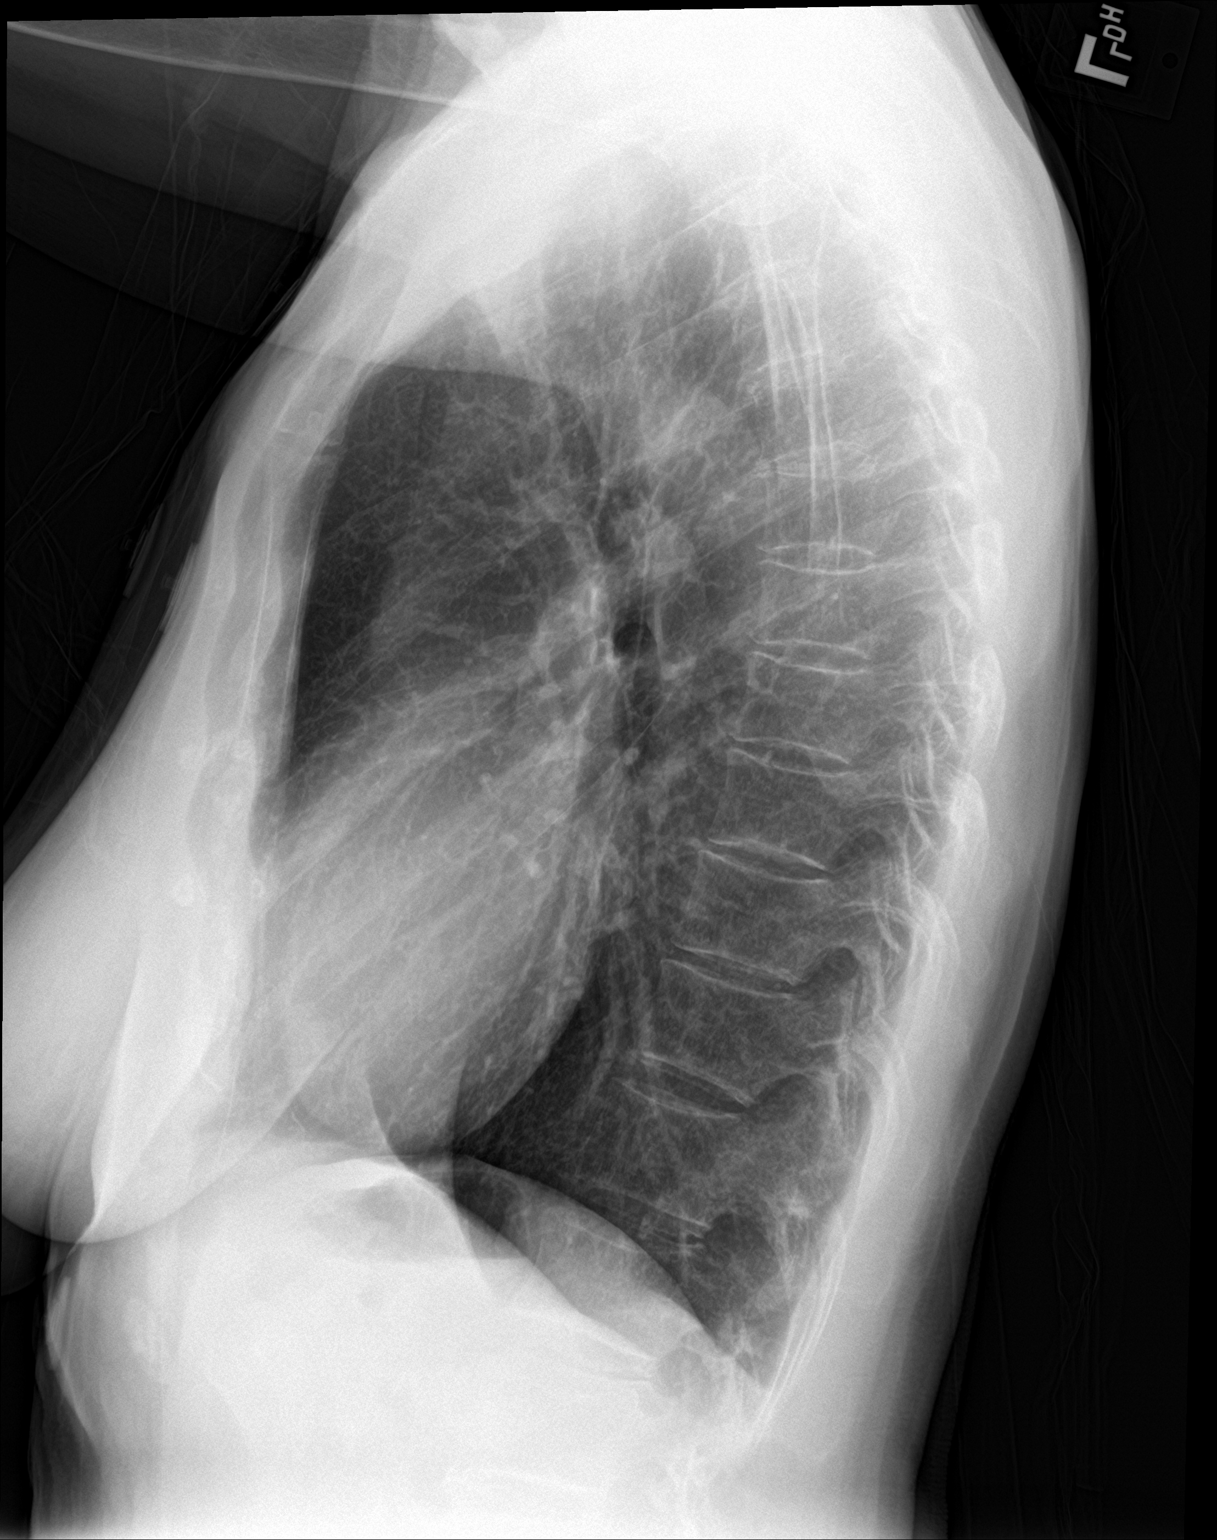

[2 of 2 positions shown; findings below may reference images not displayed]

FINDINGS: Cardiomediastinal silhouette within normal limits.

No interlobular septal thickening. Pleuroparenchymal thickening at
the bilateral lung apices. No pneumothorax. No pleural effusion. No
confluent airspace disease. Mild coarsening of the interstitial
markings.

Fall degenerative changes of the spine. No acute displaced fracture.
IMPRESSION: Chronic lung changes without evidence of acute cardiopulmonary
disease

## 2022-03-05 ENCOUNTER — Encounter: Payer: Self-pay | Admitting: Internal Medicine

## 2022-03-05 ENCOUNTER — Ambulatory Visit (INDEPENDENT_AMBULATORY_CARE_PROVIDER_SITE_OTHER): Payer: Medicare HMO | Admitting: Internal Medicine

## 2022-03-05 ENCOUNTER — Ambulatory Visit: Payer: Self-pay | Admitting: *Deleted

## 2022-03-05 VITALS — BP 114/73 | HR 87 | Temp 98.3°F | Ht 62.01 in | Wt 109.4 lb

## 2022-03-05 DIAGNOSIS — R309 Painful micturition, unspecified: Secondary | ICD-10-CM | POA: Insufficient documentation

## 2022-03-05 LAB — MICROSCOPIC EXAMINATION
Bacteria, UA: NONE SEEN
RBC, Urine: NONE SEEN /hpf (ref 0–2)

## 2022-03-05 LAB — URINALYSIS, ROUTINE W REFLEX MICROSCOPIC
Bilirubin, UA: NEGATIVE
Glucose, UA: NEGATIVE
Ketones, UA: NEGATIVE
Nitrite, UA: NEGATIVE
Protein,UA: NEGATIVE
Specific Gravity, UA: 1.01 (ref 1.005–1.030)
Urobilinogen, Ur: 0.2 mg/dL (ref 0.2–1.0)
pH, UA: 7 (ref 5.0–7.5)

## 2022-03-05 MED ORDER — SULFAMETHOXAZOLE-TRIMETHOPRIM 800-160 MG PO TABS: 1.0000 | ORAL_TABLET | Freq: Two times a day (BID) | ORAL | 0 refills | Status: AC

## 2022-03-05 NOTE — Progress Notes (Signed)
? ?BP 114/73   Pulse 87   Temp 98.3 ?F (36.8 ?C) (Oral)   Ht 5' 2.01" (1.575 m)   Wt 109 lb 6.4 oz (49.6 kg)   SpO2 95%   BMI 20.00 kg/m?   ? ?Subjective:  ? ? Patient ID: Kayla Johnson, female    DOB: [birth year], 76 y.o.   MRN: [id number] ? ?Chief Complaint  ?Patient presents with  ? Painful urnination  ?  Started last night, has blood in urine and burning sensation  ? ? ?HPI: ?Kayla Johnson is a 76 y.o. female ? ?Hematuria ?This is a new problem. The current episode started yesterday. The problem has been waxing and waning since onset. Irritative symptoms include frequency. Irritative symptoms do not include urgency. Obstructive symptoms do not include a slower stream or a weak stream. Associated symptoms include abdominal pain. Pertinent negatives include no bladder pain, bone pain, chills, dysuria, fever, flank pain, genital pain, inability to urinate, nausea, urinary retention or vomiting. (Has pelvic pain )  ? ?Chief Complaint  ?Patient presents with  ? Painful urnination  ?  Started last night, has blood in urine and burning sensation  ? ? ?Relevant past medical, surgical, family and social history reviewed and updated as indicated. Interim medical history since our last visit reviewed. ?Allergies and medications reviewed and updated. ? ?Review of Systems  ?Constitutional:  Negative for chills and fever.  ?Gastrointestinal:  Positive for abdominal pain. Negative for nausea and vomiting.  ?Genitourinary:  Positive for frequency and hematuria. Negative for dysuria, flank pain and urgency.  ? ?Per HPI unless specifically indicated above ? ?   ?Objective:  ?  ?BP 114/73   Pulse 87   Temp 98.3 ?F (36.8 ?C) (Oral)   Ht 5' 2.01" (1.575 m)   Wt 109 lb 6.4 oz (49.6 kg)   SpO2 95%   BMI 20.00 kg/m?   ?Wt Readings from Last 3 Encounters:  ?03/05/22 109 lb 6.4 oz (49.6 kg)  ?02/04/22 110 lb 3.2 oz (50 kg)  ?11/07/21 113 lb 9.6 oz (51.5 kg)  ?  ?Physical Exam ?Vitals and nursing note reviewed.  ?Constitutional:    ?   General: She is not in acute distress. ?   Appearance: Normal appearance. She is not ill-appearing or diaphoretic.  ?Eyes:  ?   Conjunctiva/sclera: Conjunctivae normal.  ?Pulmonary:  ?   Breath sounds: No rhonchi.  ?Abdominal:  ?   General: Abdomen is flat. Bowel sounds are normal. There is no distension.  ?   Palpations: Abdomen is soft.  ?   Tenderness: There is no abdominal tenderness. There is no guarding.  ?Skin: ?   General: Skin is warm and dry.  ?   Coloration: Skin is not jaundiced.  ?   Findings: No erythema.  ?Neurological:  ?   Mental Status: She is alert.  ? ? ?Results for orders placed or performed in visit on 11/07/21  ?Comp Met (CMET)  ?Result Value Ref Range  ? Glucose 79 70 - 99 mg/dL  ? BUN 9 8 - 27 mg/dL  ? Creatinine, Ser 0.72 0.57 - 1.00 mg/dL  ? eGFR 86 >59 mL/min/1.73  ? BUN/Creatinine Ratio 13 12 - 28  ? Sodium 143 134 - 144 mmol/L  ? Potassium 4.3 3.5 - 5.2 mmol/L  ? Chloride 105 96 - 106 mmol/L  ? CO2 26 20 - 29 mmol/L  ? Calcium 9.2 8.7 - 10.3 mg/dL  ? Total Protein 6.2 6.0 - 8.5 g/dL  ?  Albumin 4.2 3.7 - 4.7 g/dL  ? Globulin, Total 2.0 1.5 - 4.5 g/dL  ? Albumin/Globulin Ratio 2.1 1.2 - 2.2  ? Bilirubin Total 0.2 0.0 - 1.2 mg/dL  ? Alkaline Phosphatase 100 44 - 121 IU/L  ? AST 20 0 - 40 IU/L  ? ALT 13 0 - 32 IU/L  ?Lipid Profile  ?Result Value Ref Range  ? Cholesterol, Total 182 100 - 199 mg/dL  ? Triglycerides 91 0 - 149 mg/dL  ? HDL 51 >39 mg/dL  ? VLDL Cholesterol Cal 17 5 - 40 mg/dL  ? LDL Chol Calc (NIH) 114 (H) 0 - 99 mg/dL  ? Chol/HDL Ratio 3.6 0.0 - 4.4 ratio  ?Vitamin D (25 hydroxy)  ?Result Value Ref Range  ? Vit D, 25-Hydroxy 43.1 30.0 - 100.0 ng/mL  ?CBC w/Diff  ?Result Value Ref Range  ? WBC 6.6 3.4 - 10.8 x10E3/uL  ? RBC 4.27 3.77 - 5.28 x10E6/uL  ? Hemoglobin 11.7 11.1 - 15.9 g/dL  ? Hematocrit 35.7 34.0 - 46.6 %  ? MCV 84 79 - 97 fL  ? MCH 27.4 26.6 - 33.0 pg  ? MCHC 32.8 31.5 - 35.7 g/dL  ? RDW 14.0 11.7 - 15.4 %  ? Platelets 381 150 - 450 x10E3/uL  ? Neutrophils 57  Not Estab. %  ? Lymphs 31 Not Estab. %  ? Monocytes 8 Not Estab. %  ? Eos 3 Not Estab. %  ? Basos 1 Not Estab. %  ? Neutrophils Absolute 3.8 1.4 - 7.0 x10E3/uL  ? Lymphocytes Absolute 2.1 0.7 - 3.1 x10E3/uL  ? Monocytes Absolute 0.5 0.1 - 0.9 x10E3/uL  ? EOS (ABSOLUTE) 0.2 0.0 - 0.4 x10E3/uL  ? Basophils Absolute 0.0 0.0 - 0.2 x10E3/uL  ? Immature Granulocytes 0 Not Estab. %  ? Immature Grans (Abs) 0.0 0.0 - 0.1 x10E3/uL  ? ?   ? ? ?Current Outpatient Medications:  ?  cetirizine (ZYRTEC) 10 MG tablet, Take 1 tablet (10 mg total) by mouth daily., Disp: 90 tablet, Rfl: 3 ?  Cyanocobalamin (VITAMIN B 12 PO), Take by mouth daily. , Disp: , Rfl:  ?  diclofenac Sodium (VOLTAREN) 1 % GEL, Apply 2 g topically 4 (four) times daily., Disp: 50 g, Rfl: 1 ?  fluticasone (FLONASE) 50 MCG/ACT nasal spray, Place 2 sprays into both nostrils daily., Disp: 16 g, Rfl: 2 ?  Multiple Vitamins-Minerals (CENTRUM SILVER 50+WOMEN PO), Take by mouth., Disp: , Rfl:  ?  naproxen (NAPROSYN) 500 MG tablet, Take 1 tablet (500 mg total) by mouth 2 (two) times daily., Disp: 60 tablet, Rfl: 1 ?  ondansetron (ZOFRAN ODT) 4 MG disintegrating tablet, Take 1 tablet (4 mg total) by mouth every 8 (eight) hours as needed., Disp: 20 tablet, Rfl: 1 ?  Simethicone (GAS-X PO), Take by mouth., Disp: , Rfl:  ?  tiZANidine (ZANAFLEX) 4 MG tablet, Take 1 tablet (4 mg total) by mouth every 8 (eight) hours as needed., Disp: 30 tablet, Rfl: 1 ?  triamcinolone (KENALOG) 0.1 %, 2 (two) times daily as needed, Disp: 90 g, Rfl: 0 ?  vitamin E 180 MG (400 UNITS) capsule, Take 400 Units by mouth daily., Disp: , Rfl:   ? ? ?Assessment & Plan:  ? ?UTI ?check UA.  pt is currently symptomatic for an Urinary tract infection(abd pain, burning etc), will cover with emperic abx, see med module for details.  encouraged to increase water/fluid intake.Signs and symptoms of emergency were discussed with the patient. The risks, benefits  and side effects of treatment were discussed with  the patient. The patient verbalized an understanding of plan, and was told to call the clinic/go to the ED if symptoms worsen at any point of time. ? ?Problem List Items Addressed This Visit   ?None ?Visit Diagnoses   ? ? Painful urination    -  Primary  ? Relevant Orders  ? Urine Culture  ? Urinalysis, Routine w reflex microscopic  ? ?  ?  ? ?Orders Placed This Encounter  ?Procedures  ? Urine Culture  ? Urinalysis, Routine w reflex microscopic  ?  ? ?No orders of the defined types were placed in this encounter. ?  ? ?Follow up plan: ?No follow-ups on file. ? ?

## 2022-03-05 NOTE — Telephone Encounter (Signed)
?  Chief Complaint: Dysuria ?Symptoms: burning with urination 10/10 last night, mild this AM, noted blood with wiping this AM ?Frequency: LAst night ?Pertinent Negatives: Patient denies fever, back/flank pain ?Disposition: '[]'$ ED /'[]'$ Urgent Care (no appt availability in office) / '[x]'$ Appointment(In office/virtual)/ '[]'$  [place] [facility]/ '[]'$ Home Care/ '[]'$ Refused Recommended Disposition /'[]'$ [place] [facility]/ '[]'$  Follow-up with PCP ?Additional Notes: Appt secured for this morning. Care advise given per protocol, verbalizes understanding. ?Reason for Disposition ? Urinating more frequently than usual (i.e., frequency) ? ?Answer Assessment - Initial Assessment Questions ?1. SYMPTOM: "What's the main symptom you're concerned about?" (e.g., frequency, incontinence) ?    Burning pain ?2. ONSET: "When did the    start?" ?    Last night ?3. PAIN: "Is there any pain?" If Yes, ask: "How bad is it?" (Scale: 1-10; mild, moderate, severe) ?    Yes. Better than last night. 10/10 last night.   ?4. CAUSE: "What do you think is causing the symptoms?" ?    UTI ?5. OTHER SYMPTOMS: "Do you have any other symptoms?" (e.g., fever, flank pain, blood in urine, pain with urination) ?    Noted when wiping, frequency and urgency ? ?Protocols used: Urinary Symptoms-A-AH ? ?

## 2022-03-08 LAB — URINE CULTURE

## 2022-03-09 NOTE — Progress Notes (Signed)
Contacted via [facility] ? ? ?Good evening Kayla Johnson, your urine culture returned.  I see you are taking Bactrim ordered by Dr. Neomia Dear.  Urine does show bacteria, but not at growth >100,000.  Often we do not treat if not greater then 100,000, but with your symptoms on review of not I recommend for now completing course and maintain visit with [name] upcoming.  Any questions?

## 2022-03-26 ENCOUNTER — Ambulatory Visit: Payer: Medicare HMO | Admitting: Nurse Practitioner

## 2022-05-13 NOTE — Progress Notes (Unsigned)
There were no vitals taken for this visit.   Subjective:    Patient ID: Kayla Johnson, female    DOB: 08-02-1946, 76 y.o.   MRN: [id number]  HPI: Kayla Johnson is a 76 y.o. female presenting on 05/14/2022 for comprehensive medical examination. Current medical complaints include:{Blank single:19197::"none","***"}  She currently lives with: Menopausal Symptoms: {Blank single:19197::"yes","no"}  HYPERLIPIDEMIA Hyperlipidemia status: {Blank single:19197::"excellent compliance","good compliance","fair compliance","poor compliance"} Satisfied with current treatment?  {Blank single:19197::"yes","no"} Side effects:  {Blank single:19197::"yes","no"} Medication compliance: {Blank single:19197::"excellent compliance","good compliance","fair compliance","poor compliance"} Past cholesterol meds: {Blank multiple:19196::"none","atorvastain (lipitor)","lovastatin (mevacor)","pravastatin (pravachol)","rosuvastatin (crestor)","simvastatin (zocor)","vytorin","fenofibrate (tricor)","gemfibrozil","ezetimide (zetia)","niaspan","lovaza"} Supplements: {Blank multiple:19196::"none","fish oil","niacin","red yeast rice"} Aspirin:  {Blank single:19197::"yes","no"} The ASCVD Risk score (Arnett DK, et al., 2019) failed to calculate for the following reasons:   Unable to determine if patient is Non-Hispanic African American Chest pain:  {Blank single:19197::"yes","no"} Coronary artery disease:  {Blank single:19197::"yes","no"} Family history CAD:  {Blank single:19197::"yes","no"} Family history early CAD:  {Blank single:19197::"yes","no"}  Depression Screen done today and results listed below:     03/05/2022   10:51 AM 10/23/2021   11:38 AM 08/21/2020    3:58 PM 08/07/2020    4:48 PM 07/05/2020    1:10 PM  Depression screen PHQ 2/9  Decreased Interest 0 0 0 0 0  Down, Depressed, Hopeless 0 0 0 0 0  PHQ - 2 Score 0 0 0 0 0  Altered sleeping 0 0 0 0   Tired, decreased energy 0 0 3 3   Change in appetite 0 0 0 3    Feeling bad or failure about yourself  0 0 0 0   Trouble concentrating 0 0 0 0   Moving slowly or fidgety/restless 0 0 0 0   Suicidal thoughts 0 0 0 0   PHQ-9 Score 0 0 3 6   Difficult doing work/chores Not difficult at all  Not difficult at all Not difficult at all     The patient {has/does not have:19849} a history of falls. I {did/did not:19850} complete a risk assessment for falls. A plan of care for falls {was/was not:19852} documented.   Past Medical History:  Past Medical History:  Diagnosis Date   Carpal tunnel syndrome    COVID-19 12/06/2020   reported by patient   GERD (gastroesophageal reflux disease)    Lichen sclerosus    Pernicious anemia    Shoulder bursitis     Surgical History:  Past Surgical History:  Procedure Laterality Date   ABDOMINAL HYSTERECTOMY     ESOPHAGOGASTRODUODENOSCOPY (EGD) WITH PROPOFOL N/A 09/12/2020   Procedure: ESOPHAGOGASTRODUODENOSCOPY (EGD) WITH PROPOFOL;  Surgeon: Toledo, Benay Pike, MD;  Location: [facility] ENDOSCOPY;  Service: Gastroenterology;  Laterality: N/A;   Molar Pregnancy     S/P D & C   TONSILLECTOMY     Tubes & Ovaries removed  2015    Medications:  Current Outpatient Medications on File Prior to Visit  Medication Sig   cetirizine (ZYRTEC) 10 MG tablet Take 1 tablet (10 mg total) by mouth daily.   Cyanocobalamin (VITAMIN B 12 PO) Take by mouth daily.    diclofenac Sodium (VOLTAREN) 1 % GEL Apply 2 g topically 4 (four) times daily.   fluticasone (FLONASE) 50 MCG/ACT nasal spray Place 2 sprays into both nostrils daily.   Multiple Vitamins-Minerals (CENTRUM SILVER 50+WOMEN PO) Take by mouth.   naproxen (NAPROSYN) 500 MG tablet Take 1 tablet (500 mg total) by mouth 2 (two) times daily.   ondansetron (ZOFRAN ODT) 4 MG disintegrating tablet Take  1 tablet (4 mg total) by mouth every 8 (eight) hours as needed.   Simethicone (GAS-X PO) Take by mouth.   tiZANidine (ZANAFLEX) 4 MG tablet Take 1 tablet (4 mg total) by mouth every 8  (eight) hours as needed.   triamcinolone (KENALOG) 0.1 % 2 (two) times daily as needed   vitamin E 180 MG (400 UNITS) capsule Take 400 Units by mouth daily.   No current facility-administered medications on file prior to visit.    Allergies:  Allergies  Allergen Reactions   Lidocaine Tinitus    Social History:  Social History   Socioeconomic History   Marital status: Widowed    Spouse name: Not on file   Number of children: Not on file   Years of education: Not on file   Highest education level: Not on file  Occupational History   Not on file  Tobacco Use   Smoking status: Never   Smokeless tobacco: Never  Vaping Use   Vaping Use: Never used  Substance and Sexual Activity   Alcohol use: No    Alcohol/week: 0.0 standard drinks of alcohol   Drug use: No   Sexual activity: Not Currently    Birth control/protection: Post-menopausal  Other Topics Concern   Not on file  Social History Narrative   Works 32 hours a week at [employer] Strain: Low Risk  (04/14/2018)   Overall Financial Resource Strain (CARDIA)    Difficulty of Paying Living Expenses: Not hard at all  Food Insecurity: No Food Insecurity (04/14/2018)   Hunger Vital Sign    Worried About Running Out of Food in the Last Year: Never true    [place] in the Last Year: Never true  Transportation Needs: No Transportation Needs (04/14/2018)   PRAPARE - Hydrologist (Medical): No    Lack of Transportation (Non-Medical): No  Physical Activity: Inactive (04/14/2018)   Exercise Vital Sign    Days of Exercise per Week: 0 days    Minutes of Exercise per Session: 0 min  Stress: No Stress Concern Present (04/14/2018)   [place]    Feeling of Stress : Not at all  Social Connections: Somewhat Isolated (04/14/2018)   Social Connection and Isolation Panel [NHANES]     Frequency of Communication with Friends and Family: More than three times a week    Frequency of Social Gatherings with Friends and Family: More than three times a week    Attends Religious Services: More than 4 times per year    Active Member of [employer] or Organizations: No    Attends Archivist Meetings: Never    Marital Status: Widowed  Intimate Partner Violence: Not At Risk (04/14/2018)   Humiliation, Afraid, Rape, and Kick questionnaire    Fear of Current or Ex-Partner: No    Emotionally Abused: No    Physically Abused: No    Sexually Abused: No   Social History   Tobacco Use  Smoking Status Never  Smokeless Tobacco Never   Social History   Substance and Sexual Activity  Alcohol Use No   Alcohol/week: 0.0 standard drinks of alcohol    Family History:  Family History  Problem Relation Age of Onset   Arthritis Mother    Diabetes Mother    Arthritis Father    Diabetes Sister    Stroke Sister    Diabetes  Sister    Ovarian cancer Sister     Past medical history, surgical history, medications, allergies, family history and social history reviewed with patient today and changes made to appropriate areas of the chart.   ROS All other ROS negative except what is listed above and in the HPI.      Objective:    There were no vitals taken for this visit.  Wt Readings from Last 3 Encounters:  03/05/22 109 lb 6.4 oz (49.6 kg)  02/04/22 110 lb 3.2 oz (50 kg)  11/07/21 113 lb 9.6 oz (51.5 kg)    Physical Exam  Results for orders placed or performed in visit on 03/05/22  Urine Culture   Specimen: Urine   UR  Result Value Ref Range   Urine Culture, Routine Final report (A)    Organism ID, Bacteria Escherichia coli (A)    Antimicrobial Susceptibility Comment   Microscopic Examination   Urine  Result Value Ref Range   WBC, UA 0-5 0 - 5 /hpf   RBC None seen 0 - 2 /hpf   Epithelial Cells (non renal) 0-10 0 - 10 /hpf   Mucus, UA Present (A) Not Estab.    Bacteria, UA None seen None seen/Few  Urinalysis, Routine w reflex microscopic  Result Value Ref Range   Specific Gravity, UA 1.010 1.005 - 1.030   pH, UA 7.0 5.0 - 7.5   Color, UA Yellow Yellow   Appearance Ur Clear Clear   Leukocytes,UA 1+ (A) Negative   Protein,UA Negative Negative/Trace   Glucose, UA Negative Negative   Ketones, UA Negative Negative   RBC, UA Trace (A) Negative   Bilirubin, UA Negative Negative   Urobilinogen, Ur 0.2 0.2 - 1.0 mg/dL   Nitrite, UA Negative Negative   Microscopic Examination See below:       Assessment & Plan:   Problem List Items Addressed This Visit       Cardiovascular and Mediastinum   Senile purpura (HCC)   Aortic atherosclerosis (HCC)     Other   Vitamin D deficiency   Hypercholesteremia   Other Visit Diagnoses     Annual physical exam    -  Primary        Follow up plan: No follow-ups on file.   LABORATORY TESTING:  - Pap smear: {Blank YCXKGY:18563::"JSH done","not applicable","up to date","done elsewhere"}  IMMUNIZATIONS:   - Tdap: Tetanus vaccination status reviewed: {tetanus status:315746}. - Influenza: {Blank single:19197::"Up to date","Administered today","Postponed to flu season","Refused","Given elsewhere"} - Pneumovax: {Blank single:19197::"Up to date","Administered today","Not applicable","Refused","Given elsewhere"} - Prevnar: {Blank single:19197::"Up to date","Administered today","Not applicable","Refused","Given elsewhere"} - COVID: {Blank single:19197::"Up to date","Administered today","Not applicable","Refused","Given elsewhere"} - HPV: {Blank single:19197::"Up to date","Administered today","Not applicable","Refused","Given elsewhere"} - Shingrix vaccine: {Blank single:19197::"Up to date","Administered today","Not applicable","Refused","Given elsewhere"}  SCREENING: -Mammogram: {Blank single:19197::"Up to date","Ordered today","Not applicable","Refused","Done elsewhere"}  - Colonoscopy: {Blank  single:19197::"Up to date","Ordered today","Not applicable","Refused","Done elsewhere"}  - Bone Density: {Blank single:19197::"Up to date","Ordered today","Not applicable","Refused","Done elsewhere"}  -Hearing Test: {Blank single:19197::"Up to date","Ordered today","Not applicable","Refused","Done elsewhere"}  -Spirometry: {Blank single:19197::"Up to date","Ordered today","Not applicable","Refused","Done elsewhere"}   PATIENT COUNSELING:   Advised to take 1 mg of folate supplement per day if capable of pregnancy.   Sexuality: Discussed sexually transmitted diseases, partner selection, use of condoms, avoidance of unintended pregnancy  and contraceptive alternatives.   Advised to avoid cigarette smoking.  I discussed with the patient that most people either abstain from alcohol or drink within safe limits (<=14/week and <=4 drinks/occasion for males, <=7/weeks and <= 3  drinks/occasion for females) and that the risk for alcohol disorders and other health effects rises proportionally with the number of drinks per week and how often a drinker exceeds daily limits.  Discussed cessation/primary prevention of drug use and availability of treatment for abuse.   Diet: Encouraged to adjust caloric intake to maintain  or achieve ideal body weight, to reduce intake of dietary saturated fat and total fat, to limit sodium intake by avoiding high sodium foods and not adding table salt, and to maintain adequate dietary potassium and calcium preferably from fresh fruits, vegetables, and low-fat dairy products.    stressed the importance of regular exercise  Injury prevention: Discussed safety belts, safety helmets, smoke detector, smoking near bedding or upholstery.   Dental health: Discussed importance of regular tooth brushing, flossing, and dental visits.    NEXT PREVENTATIVE PHYSICAL DUE IN 1 YEAR. No follow-ups on file.

## 2022-05-14 ENCOUNTER — Ambulatory Visit (INDEPENDENT_AMBULATORY_CARE_PROVIDER_SITE_OTHER): Payer: Medicare HMO | Admitting: Nurse Practitioner

## 2022-05-14 ENCOUNTER — Encounter: Payer: Self-pay | Admitting: Nurse Practitioner

## 2022-05-14 VITALS — BP 118/71 | HR 75 | Temp 98.5°F | Ht 61.02 in | Wt 107.8 lb

## 2022-05-14 DIAGNOSIS — E559 Vitamin D deficiency, unspecified: Secondary | ICD-10-CM | POA: Diagnosis not present

## 2022-05-14 DIAGNOSIS — J309 Allergic rhinitis, unspecified: Secondary | ICD-10-CM | POA: Diagnosis not present

## 2022-05-14 DIAGNOSIS — E7800 Pure hypercholesterolemia, unspecified: Secondary | ICD-10-CM | POA: Diagnosis not present

## 2022-05-14 DIAGNOSIS — Z23 Encounter for immunization: Secondary | ICD-10-CM | POA: Diagnosis not present

## 2022-05-14 DIAGNOSIS — D692 Other nonthrombocytopenic purpura: Secondary | ICD-10-CM

## 2022-05-14 DIAGNOSIS — Z0000 Encounter for general adult medical examination without abnormal findings: Secondary | ICD-10-CM

## 2022-05-14 DIAGNOSIS — I700 Atherosclerosis of aorta: Secondary | ICD-10-CM | POA: Diagnosis not present

## 2022-05-14 DIAGNOSIS — L900 Lichen sclerosus et atrophicus: Secondary | ICD-10-CM

## 2022-05-14 DIAGNOSIS — Z1231 Encounter for screening mammogram for malignant neoplasm of breast: Secondary | ICD-10-CM

## 2022-05-14 LAB — URINALYSIS, ROUTINE W REFLEX MICROSCOPIC
Bilirubin, UA: NEGATIVE
Glucose, UA: NEGATIVE
Ketones, UA: NEGATIVE
Leukocytes,UA: NEGATIVE
Nitrite, UA: NEGATIVE
Protein,UA: NEGATIVE
RBC, UA: NEGATIVE
Specific Gravity, UA: 1.02 (ref 1.005–1.030)
Urobilinogen, Ur: 1 mg/dL (ref 0.2–1.0)
pH, UA: 7.5 (ref 5.0–7.5)

## 2022-05-14 MED ORDER — TRIAMCINOLONE ACETONIDE 0.1 % EX CREA: TOPICAL_CREAM | CUTANEOUS | 0 refills | Status: DC

## 2022-05-14 MED ORDER — DICLOFENAC SODIUM 1 % EX GEL: 2.0000 g | Freq: Four times a day (QID) | CUTANEOUS | 1 refills | Status: DC

## 2022-05-14 MED ORDER — ONDANSETRON 4 MG PO TBDP: 4.0000 mg | ORAL_TABLET | Freq: Three times a day (TID) | ORAL | 1 refills | Status: DC | PRN

## 2022-05-14 MED ORDER — TIZANIDINE HCL 4 MG PO TABS: 4.0000 mg | ORAL_TABLET | Freq: Three times a day (TID) | ORAL | 1 refills | Status: DC | PRN

## 2022-05-14 MED ORDER — CETIRIZINE HCL 10 MG PO TABS: 10.0000 mg | ORAL_TABLET | Freq: Every day | ORAL | 3 refills | Status: DC

## 2022-05-14 MED ORDER — NAPROXEN 500 MG PO TABS: 500.0000 mg | ORAL_TABLET | Freq: Two times a day (BID) | ORAL | 1 refills | Status: DC

## 2022-05-14 NOTE — Assessment & Plan Note (Signed)
Found on CT 12/11/20. Stable. Labs ordered today. Will make recommendations based on lab results.  

## 2022-05-14 NOTE — Assessment & Plan Note (Signed)
Controlled with Zyrtec. Refill sent today.

## 2022-05-14 NOTE — Assessment & Plan Note (Signed)
Labs ordered today.  Will make recommendations based on lab results. ?

## 2022-05-14 NOTE — Assessment & Plan Note (Signed)
Reassured patient, continue to monitor. 

## 2022-05-14 NOTE — Assessment & Plan Note (Signed)
Chronic.  Controlled with diet. Labs ordered today.  Return to clinic in 6 months for reevaluation.  Call sooner if concerns arise.   

## 2022-05-15 LAB — CBC WITH DIFFERENTIAL/PLATELET
Basophils Absolute: 0 10*3/uL (ref 0.0–0.2)
Basos: 1 %
EOS (ABSOLUTE): 0.1 10*3/uL (ref 0.0–0.4)
Eos: 2 %
Hematocrit: 37.9 % (ref 34.0–46.6)
Hemoglobin: 12.4 g/dL (ref 11.1–15.9)
Immature Grans (Abs): 0 10*3/uL (ref 0.0–0.1)
Immature Granulocytes: 0 %
Lymphocytes Absolute: 2 10*3/uL (ref 0.7–3.1)
Lymphs: 38 %
MCH: 27.9 pg (ref 26.6–33.0)
MCHC: 32.7 g/dL (ref 31.5–35.7)
MCV: 85 fL (ref 79–97)
Monocytes Absolute: 0.5 10*3/uL (ref 0.1–0.9)
Monocytes: 9 %
Neutrophils Absolute: 2.6 10*3/uL (ref 1.4–7.0)
Neutrophils: 50 %
Platelets: 337 10*3/uL (ref 150–450)
RBC: 4.44 x10E6/uL (ref 3.77–5.28)
RDW: 14 % (ref 11.7–15.4)
WBC: 5.2 10*3/uL (ref 3.4–10.8)

## 2022-05-15 LAB — LIPID PANEL
Chol/HDL Ratio: 3.6 ratio (ref 0.0–4.4)
Cholesterol, Total: 210 mg/dL — ABNORMAL HIGH (ref 100–199)
HDL: 59 mg/dL (ref >39)
LDL Chol Calc (NIH): 136 mg/dL — ABNORMAL HIGH (ref 0–99)
Triglycerides: 82 mg/dL (ref 0–149)
VLDL Cholesterol Cal: 15 mg/dL (ref 5–40)

## 2022-05-15 LAB — COMPREHENSIVE METABOLIC PANEL
ALT: 7 IU/L (ref 0–32)
AST: 15 IU/L (ref 0–40)
Albumin/Globulin Ratio: 2 (ref 1.2–2.2)
Albumin: 4.2 g/dL (ref 3.7–4.7)
Alkaline Phosphatase: 87 IU/L (ref 44–121)
BUN/Creatinine Ratio: 14 (ref 12–28)
BUN: 11 mg/dL (ref 8–27)
Bilirubin Total: <0.2 mg/dL (ref 0.0–1.2)
CO2: 23 mmol/L (ref 20–29)
Calcium: 9.2 mg/dL (ref 8.7–10.3)
Chloride: 102 mmol/L (ref 96–106)
Creatinine, Ser: 0.8 mg/dL (ref 0.57–1.00)
Globulin, Total: 2.1 g/dL (ref 1.5–4.5)
Glucose: 95 mg/dL (ref 70–99)
Potassium: 4.6 mmol/L (ref 3.5–5.2)
Sodium: 144 mmol/L (ref 134–144)
Total Protein: 6.3 g/dL (ref 6.0–8.5)
eGFR: 76 mL/min/{1.73_m2} (ref >59)

## 2022-05-15 LAB — VITAMIN D 25 HYDROXY (VIT D DEFICIENCY, FRACTURES): Vit D, 25-Hydroxy: 34.6 ng/mL (ref 30.0–100.0)

## 2022-05-15 LAB — TSH: TSH: 2.23 u[IU]/mL (ref 0.450–4.500)

## 2022-05-15 NOTE — Progress Notes (Signed)
Please let patient know that her lab work looks good.  Her cholesterol is elevated.  I recommend a low fat diet and exercise.  No other concerns at this time.  Continue with current medication regimen.  Follow up as discussed.

## 2022-05-21 DIAGNOSIS — L900 Lichen sclerosus et atrophicus: Secondary | ICD-10-CM | POA: Diagnosis not present

## 2022-07-09 ENCOUNTER — Ambulatory Visit: Payer: Medicare HMO

## 2022-08-06 DIAGNOSIS — L900 Lichen sclerosus et atrophicus: Secondary | ICD-10-CM | POA: Diagnosis not present

## 2022-08-06 DIAGNOSIS — L570 Actinic keratosis: Secondary | ICD-10-CM | POA: Diagnosis not present

## 2022-08-11 ENCOUNTER — Telehealth: Payer: Self-pay | Admitting: Nurse Practitioner

## 2022-08-11 NOTE — Telephone Encounter (Signed)
Copied from [place] [phone]. Topic: Medicare AWV >> Aug 11, 2022  4:51 PM Josephina Gip wrote: Reason for CRM:  Left message for patient to call back and schedule Medicare Annual Wellness Visit (AWV) to be done virtually or by telephone.  No hx of AWV eligible as of 04/14/18  Please schedule at anytime with [facility]-Nurse Health Advisor.      80 Minutes appointment   Any questions, please call me at [phone]

## 2022-08-28 ENCOUNTER — Ambulatory Visit: Payer: Self-pay | Admitting: *Deleted

## 2022-08-28 ENCOUNTER — Encounter: Payer: Self-pay | Admitting: Nurse Practitioner

## 2022-08-28 ENCOUNTER — Ambulatory Visit (INDEPENDENT_AMBULATORY_CARE_PROVIDER_SITE_OTHER): Payer: Medicare HMO | Admitting: Nurse Practitioner

## 2022-08-28 VITALS — BP 127/74 | HR 76 | Temp 99.0°F | Wt 109.8 lb

## 2022-08-28 DIAGNOSIS — R6889 Other general symptoms and signs: Secondary | ICD-10-CM

## 2022-08-28 LAB — VERITOR FLU A/B WAIVED
Influenza A: NEGATIVE
Influenza B: NEGATIVE

## 2022-08-28 MED ORDER — GUAIFENESIN ER 600 MG PO TB12: 600.0000 mg | ORAL_TABLET | Freq: Two times a day (BID) | ORAL | 0 refills | Status: DC

## 2022-08-28 MED ORDER — ONDANSETRON 4 MG PO TBDP: 4.0000 mg | ORAL_TABLET | Freq: Three times a day (TID) | ORAL | 1 refills | Status: DC | PRN

## 2022-08-28 NOTE — Telephone Encounter (Signed)
  Chief Complaint: Headache and tongue and throat feeling heavy and thick. Symptoms: Headache, sinus congestion, sore throat, runny nose, pain behind right eye, feeling tired and weak, post nasal drip Frequency: For last couple of days Pertinent Negatives: Patient denies Fever or testing for Covid.   Tests she has are 76 yrs old. Disposition: '[]'$ ED /'[]'$ Urgent Care (no appt availability in office) / '[x]'$ Appointment(In office/virtual)/ '[]'$  [facility]/ '[]'$ Home Care/ '[]'$ Refused Recommended Disposition /'[]'$ [place] [facility]/ '[]'$  Follow-up with PCP Additional Notes: Appt made for today with Jon Billings, NP for 4:00.   Requested to come in because doesn't use a computer.

## 2022-08-28 NOTE — Telephone Encounter (Signed)
Reason for Disposition  [1] Sinus pain of forehead AND [2] yellow or green nasal discharge  Answer Assessment - Initial Assessment Questions 1. LOCATION: "Where does it hurt?"      Has a headache, nausea, pain behind my right eye, lumps in my throat and my tongue is thick and I'm feeling tired and weak. 2. ONSET: "When did the headache start?" (Minutes, hours or days)      Started a couple of days ago.   My throat was sore.   I'm just weak and not feel well.   I stopped the cough medicine.   My throat is thick and and so is my tongue.  I'm having drainage.    Has not tested for Covid.    I had diarrhea a couple of days ago but it's gone today.   I feel like my heart flutters sometimes.   I sigh a lot. 3. PATTERN: "Does the pain come and go, or has it been constant since it started?"     I don't feel good in my head especially on the right side.   It's real noisy in my head.   I've been diagnosed with tinnitis but it's usually not this bad. 4. SEVERITY: "How bad is the pain?" and "What does it keep you from doing?"  (e.g., Scale 1-10; mild, moderate, or severe)   - MILD (1-3): doesn't interfere with normal activities    - MODERATE (4-7): interferes with normal activities or awakens from sleep    - SEVERE (8-10): excruciating pain, unable to do any normal activities        I've been sneezing some.   I'm blowing my nose.   I have a lot of mucus in my nose. 5. RECURRENT SYMPTOM: "Have you ever had headaches before?" If Yes, ask: "When was the last time?" and "What happened that time?"      I have sinus problems. 6. CAUSE: "What do you think is causing the headache?"     I'm congestion. 7. MIGRAINE: "Have you been diagnosed with migraine headaches?" If Yes, ask: "Is this headache similar?"      No 8. HEAD INJURY: "Has there been any recent injury to the head?"      No 9. OTHER SYMPTOMS: "Do you have any other symptoms?" (fever, stiff neck, eye pain, sore throat, cold symptoms)     See above     respiratory symptoms. 10. PREGNANCY: "Is there any chance you are pregnant?" "When was your last menstrual period?"       N/A  Protocols used: Headache-A-AH

## 2022-08-28 NOTE — Progress Notes (Signed)
BP 127/74   Pulse 76   Temp 99 F (37.2 C) (Oral)   Wt 109 lb 12.8 oz (49.8 kg)   SpO2 95%   BMI 20.73 kg/m    Subjective:    Patient ID: Kayla Johnson, female    DOB: [birth year], 76 y.o.   MRN: [id number]  HPI: Kayla Johnson is a 76 y.o. female  Chief Complaint  Patient presents with   Headache    Pt also reports white coat on tongue, coughing up phlegm. Scratchy throat. Denies cough. Post nasal drip. Reports diarrhea 2 days ago but has improved.    UPPER RESPIRATORY TRACT INFECTION Worst symptom: symptoms started two days ago Fever: no Cough: no Shortness of breath: yes Wheezing: no Chest pain: no Chest tightness: no Chest congestion: no Nasal congestion: yes Runny nose: no Post nasal drip: yes Sneezing: yes Sore throat: yes Swollen glands: no Sinus pressure: no Headache: yes Face pain: no Toothache: no Ear pain: no bilateral Ear pressure: yes bilateral Eyes red/itching:no Eye drainage/crusting: no  Vomiting: no Rash: no Fatigue: yes Sick contacts: no Strep contacts: no  Context: stable Recurrent sinusitis: no Relief with OTC cold/cough medications: no  Treatments attempted: none   Relevant past medical, surgical, family and social history reviewed and updated as indicated. Interim medical history since our last visit reviewed. Allergies and medications reviewed and updated.  Review of Systems  Constitutional:  Positive for fatigue. Negative for fever.  HENT:  Positive for congestion, postnasal drip, sneezing and sore throat. Negative for dental problem, ear pain, rhinorrhea, sinus pressure and sinus pain.   Respiratory:  Positive for cough and shortness of breath. Negative for wheezing.   Cardiovascular:  Negative for chest pain.  Gastrointestinal:  Negative for vomiting.  Skin:  Negative for rash.  Neurological:  Positive for headaches.    Per HPI unless specifically indicated above     Objective:    BP 127/74   Pulse 76   Temp 99 F (37.2  C) (Oral)   Wt 109 lb 12.8 oz (49.8 kg)   SpO2 95%   BMI 20.73 kg/m   Wt Readings from Last 3 Encounters:  08/28/22 109 lb 12.8 oz (49.8 kg)  05/14/22 107 lb 12.8 oz (48.9 kg)  03/05/22 109 lb 6.4 oz (49.6 kg)    Physical Exam Vitals and nursing note reviewed.  Constitutional:      General: She is not in acute distress.    Appearance: Normal appearance. She is normal weight. She is not ill-appearing, toxic-appearing or diaphoretic.  HENT:     Head: Normocephalic.     Right Ear: External ear normal. A middle ear effusion is present.     Left Ear: External ear normal. A middle ear effusion is present.     Nose: Nose normal.     Right Sinus: No maxillary sinus tenderness or frontal sinus tenderness.     Left Sinus: No maxillary sinus tenderness or frontal sinus tenderness.     Mouth/Throat:     Mouth: Mucous membranes are moist.     Pharynx: Oropharynx is clear.  Eyes:     General:        Right eye: No discharge.        Left eye: No discharge.     Extraocular Movements: Extraocular movements intact.     Conjunctiva/sclera: Conjunctivae normal.     Pupils: Pupils are equal, round, and reactive to light.  Cardiovascular:     Rate and Rhythm: Normal  rate and regular rhythm.     Heart sounds: No murmur heard. Pulmonary:     Effort: Pulmonary effort is normal. No respiratory distress.     Breath sounds: Normal breath sounds. No wheezing or rales.  Musculoskeletal:     Cervical back: Normal range of motion and neck supple.  Skin:    General: Skin is warm and dry.     Capillary Refill: Capillary refill takes less than 2 seconds.  Neurological:     General: No focal deficit present.     Mental Status: She is alert and oriented to person, place, and time. Mental status is at baseline.  Psychiatric:        Mood and Affect: Mood normal.        Behavior: Behavior normal.        Thought Content: Thought content normal.        Judgment: Judgment normal.     Results for orders  placed or performed in visit on 05/14/22  CBC with Differential/Platelet  Result Value Ref Range   WBC 5.2 3.4 - 10.8 x10E3/uL   RBC 4.44 3.77 - 5.28 x10E6/uL   Hemoglobin 12.4 11.1 - 15.9 g/dL   Hematocrit 37.9 34.0 - 46.6 %   MCV 85 79 - 97 fL   MCH 27.9 26.6 - 33.0 pg   MCHC 32.7 31.5 - 35.7 g/dL   RDW 14.0 11.7 - 15.4 %   Platelets 337 150 - 450 x10E3/uL   Neutrophils 50 Not Estab. %   Lymphs 38 Not Estab. %   Monocytes 9 Not Estab. %   Eos 2 Not Estab. %   Basos 1 Not Estab. %   Neutrophils Absolute 2.6 1.4 - 7.0 x10E3/uL   Lymphocytes Absolute 2.0 0.7 - 3.1 x10E3/uL   Monocytes Absolute 0.5 0.1 - 0.9 x10E3/uL   EOS (ABSOLUTE) 0.1 0.0 - 0.4 x10E3/uL   Basophils Absolute 0.0 0.0 - 0.2 x10E3/uL   Immature Granulocytes 0 Not Estab. %   Immature Grans (Abs) 0.0 0.0 - 0.1 x10E3/uL  Comprehensive metabolic panel  Result Value Ref Range   Glucose 95 70 - 99 mg/dL   BUN 11 8 - 27 mg/dL   Creatinine, Ser 0.80 0.57 - 1.00 mg/dL   eGFR 76 >59 mL/min/1.73   BUN/Creatinine Ratio 14 12 - 28   Sodium 144 134 - 144 mmol/L   Potassium 4.6 3.5 - 5.2 mmol/L   Chloride 102 96 - 106 mmol/L   CO2 23 20 - 29 mmol/L   Calcium 9.2 8.7 - 10.3 mg/dL   Total Protein 6.3 6.0 - 8.5 g/dL   Albumin 4.2 3.7 - 4.7 g/dL   Globulin, Total 2.1 1.5 - 4.5 g/dL   Albumin/Globulin Ratio 2.0 1.2 - 2.2   Bilirubin Total <0.2 0.0 - 1.2 mg/dL   Alkaline Phosphatase 87 44 - 121 IU/L   AST 15 0 - 40 IU/L   ALT 7 0 - 32 IU/L  Lipid panel  Result Value Ref Range   Cholesterol, Total 210 (H) 100 - 199 mg/dL   Triglycerides 82 0 - 149 mg/dL   HDL 59 >39 mg/dL   VLDL Cholesterol Cal 15 5 - 40 mg/dL   LDL Chol Calc (NIH) 136 (H) 0 - 99 mg/dL   Chol/HDL Ratio 3.6 0.0 - 4.4 ratio  TSH  Result Value Ref Range   TSH 2.230 0.450 - 4.500 uIU/mL  Urinalysis, Routine w reflex microscopic  Result Value Ref Range   Specific Gravity, UA  1.020 1.005 - 1.030   pH, UA 7.5 5.0 - 7.5   Color, UA Yellow Yellow    Appearance Ur Clear Clear   Leukocytes,UA Negative Negative   Protein,UA Negative Negative/Trace   Glucose, UA Negative Negative   Ketones, UA Negative Negative   RBC, UA Negative Negative   Bilirubin, UA Negative Negative   Urobilinogen, Ur 1.0 0.2 - 1.0 mg/dL   Nitrite, UA Negative Negative  Vitamin D (25 hydroxy)  Result Value Ref Range   Vit D, 25-Hydroxy 34.6 30.0 - 100.0 ng/mL      Assessment & Plan:   Problem List Items Addressed This Visit   None Visit Diagnoses     Flu-like symptoms    -  Primary   Will swab for COVID and FLU. Discussed viral illness. Will send Mucinex and refill Zyrtec.  Follow up if symptoms not improved.    Relevant Orders   Novel Coronavirus, NAA ([facility])   Veritor Flu A/B Waived        Follow up plan: Return if symptoms worsen or fail to improve.

## 2022-08-29 NOTE — Progress Notes (Signed)
Please let patient know that her flu test was negative.  We will await her covid results.

## 2022-08-30 LAB — NOVEL CORONAVIRUS, NAA: SARS-CoV-2, NAA: NOT DETECTED

## 2022-08-31 NOTE — Progress Notes (Signed)
Hi Kayla Johnson.  Your COVID test was negative.  Symptoms are likely from another virus.  If not improved, please make another appointment.

## 2022-10-22 DIAGNOSIS — L720 Epidermal cyst: Secondary | ICD-10-CM | POA: Diagnosis not present

## 2022-10-22 DIAGNOSIS — D225 Melanocytic nevi of trunk: Secondary | ICD-10-CM | POA: Diagnosis not present

## 2022-10-22 DIAGNOSIS — L821 Other seborrheic keratosis: Secondary | ICD-10-CM | POA: Diagnosis not present

## 2022-10-22 DIAGNOSIS — L900 Lichen sclerosus et atrophicus: Secondary | ICD-10-CM | POA: Diagnosis not present

## 2022-10-22 DIAGNOSIS — L578 Other skin changes due to chronic exposure to nonionizing radiation: Secondary | ICD-10-CM | POA: Diagnosis not present

## 2022-11-18 NOTE — Progress Notes (Unsigned)
There were no vitals taken for this visit.   Subjective:    Patient ID: Kayla Johnson, female    DOB: [birth year], 76 y.o.   MRN: [id number]  HPI: Kayla Johnson is a 76 y.o. female presenting on 11/19/2022 for comprehensive medical examination. Current medical complaints include:{Blank single:19197::"none","***"}  She currently lives with: Menopausal Symptoms: {Blank single:19197::"yes","no"}  Functional Status Survey:       08/28/2022    4:05 PM 03/05/2022   10:51 AM 10/23/2021   11:38 AM 08/21/2020    3:58 PM 08/07/2020    3:13 PM  Fall Risk   Falls in the past year? 0 0 0 0 1  Number falls in past yr: 0 0 0  0  Injury with Fall? 0 0 0  0  Risk for fall due to : No Fall Risks No Fall Risks No Fall Risks    Follow up Falls evaluation completed Falls evaluation completed Falls evaluation completed      Depression Screen    08/28/2022    4:05 PM 03/05/2022   10:51 AM 10/23/2021   11:38 AM 08/21/2020    3:58 PM 08/07/2020    4:48 PM  Depression screen PHQ 2/9  Decreased Interest 0 0 0 0 0  Down, Depressed, Hopeless 0 0 0 0 0  PHQ - 2 Score 0 0 0 0 0  Altered sleeping 0 0 0 0 0  Tired, decreased energy 1 0 0 3 3  Change in appetite 0 0 0 0 3  Feeling bad or failure about yourself  0 0 0 0 0  Trouble concentrating 0 0 0 0 0  Moving slowly or fidgety/restless 0 0 0 0 0  Suicidal thoughts 0 0 0 0 0  PHQ-9 Score 1 0 0 3 6  Difficult doing work/chores Not difficult at all Not difficult at all  Not difficult at all Not difficult at all     Advanced Directives Does patient have a HCPOA?    {Blank single:19197::"yes","no"} If yes, name and contact information:  Does patient have a living will or MOST form?  {Blank single:19197::"yes","no"}  Past Medical History:  Past Medical History:  Diagnosis Date   Carpal tunnel syndrome    COVID-19 12/06/2020   reported by patient   GERD (gastroesophageal reflux disease)    Lichen sclerosus    Pernicious anemia    Shoulder bursitis      Surgical History:  Past Surgical History:  Procedure Laterality Date   ABDOMINAL HYSTERECTOMY     ESOPHAGOGASTRODUODENOSCOPY (EGD) WITH PROPOFOL N/A 09/12/2020   Procedure: ESOPHAGOGASTRODUODENOSCOPY (EGD) WITH PROPOFOL;  Surgeon: Toledo, Benay Pike, MD;  Location: [facility] ENDOSCOPY;  Service: Gastroenterology;  Laterality: N/A;   Molar Pregnancy     S/P D & C   TONSILLECTOMY     Tubes & Ovaries removed  2015    Medications:  Current Outpatient Medications on File Prior to Visit  Medication Sig   cetirizine (ZYRTEC) 10 MG tablet Take 1 tablet (10 mg total) by mouth daily.   Cyanocobalamin (VITAMIN B 12 PO) Take by mouth daily.    diclofenac Sodium (VOLTAREN) 1 % GEL Apply 2 g topically 4 (four) times daily.   fluticasone (FLONASE) 50 MCG/ACT nasal spray Place 2 sprays into both nostrils daily.   guaiFENesin (MUCINEX) 600 MG 12 hr tablet Take 1 tablet (600 mg total) by mouth 2 (two) times daily.   Multiple Vitamins-Minerals (CENTRUM SILVER 50+WOMEN PO) Take by mouth.   naproxen (NAPROSYN)  500 MG tablet Take 1 tablet (500 mg total) by mouth 2 (two) times daily.   ondansetron (ZOFRAN ODT) 4 MG disintegrating tablet Take 1 tablet (4 mg total) by mouth every 8 (eight) hours as needed.   Simethicone (GAS-X PO) Take by mouth.   tiZANidine (ZANAFLEX) 4 MG tablet Take 1 tablet (4 mg total) by mouth every 8 (eight) hours as needed.   triamcinolone cream (KENALOG) 0.1 % 2 (two) times daily as needed   vitamin E 180 MG (400 UNITS) capsule Take 400 Units by mouth daily.   No current facility-administered medications on file prior to visit.    Allergies:  Allergies  Allergen Reactions   Lidocaine Tinitus    Social History:  Social History   Socioeconomic History   Marital status: Widowed    Spouse name: Not on file   Number of children: Not on file   Years of education: Not on file   Highest education level: Not on file  Occupational History   Not on file  Tobacco Use   Smoking  status: Never   Smokeless tobacco: Never  Vaping Use   Vaping Use: Never used  Substance and Sexual Activity   Alcohol use: No    Alcohol/week: 0.0 standard drinks of alcohol   Drug use: No   Sexual activity: Not Currently    Birth control/protection: Post-menopausal  Other Topics Concern   Not on file  Social History Narrative   Works 32 hours a week at [employer] Strain: Low Risk  (04/14/2018)   Overall Financial Resource Strain (CARDIA)    Difficulty of Paying Living Expenses: Not hard at all  Food Insecurity: No Food Insecurity (04/14/2018)   Hunger Vital Sign    Worried About Running Out of Food in the Last Year: Never true    [place] in the Last Year: Never true  Transportation Needs: No Transportation Needs (04/14/2018)   PRAPARE - Hydrologist (Medical): No    Lack of Transportation (Non-Medical): No  Physical Activity: Inactive (04/14/2018)   Exercise Vital Sign    Days of Exercise per Week: 0 days    Minutes of Exercise per Session: 0 min  Stress: No Stress Concern Present (04/14/2018)   [place]    Feeling of Stress : Not at all  Social Connections: Somewhat Isolated (04/14/2018)   Social Connection and Isolation Panel [NHANES]    Frequency of Communication with Friends and Family: More than three times a week    Frequency of Social Gatherings with Friends and Family: More than three times a week    Attends Religious Services: More than 4 times per year    Active Member of [employer] or Organizations: No    Attends Archivist Meetings: Never    Marital Status: Widowed  Intimate Partner Violence: Not At Risk (04/14/2018)   Humiliation, Afraid, Rape, and Kick questionnaire    Fear of Current or Ex-Partner: No    Emotionally Abused: No    Physically Abused: No    Sexually Abused: No   Social History    Tobacco Use  Smoking Status Never  Smokeless Tobacco Never   Social History   Substance and Sexual Activity  Alcohol Use No   Alcohol/week: 0.0 standard drinks of alcohol    Family History:  Family History  Problem Relation Age of Onset  Arthritis Mother    Diabetes Mother    Arthritis Father    Diabetes Sister    Stroke Sister    Diabetes Sister    Ovarian cancer Sister     Past medical history, surgical history, medications, allergies, family history and social history reviewed with patient today and changes made to appropriate areas of the chart.   ROS  All other ROS negative except what is listed above and in the HPI.      Objective:    There were no vitals taken for this visit.  Wt Readings from Last 3 Encounters:  08/28/22 109 lb 12.8 oz (49.8 kg)  05/14/22 107 lb 12.8 oz (48.9 kg)  03/05/22 109 lb 6.4 oz (49.6 kg)    No results found.  Physical Exam     04/14/2018    1:51 PM  6CIT Screen  What Year? 0 points  What month? 0 points  What time? 0 points  Count back from 20 0 points  Months in reverse 0 points  Repeat phrase 0 points  Total Score 0 points    Cognitive Testing - 6-CIT  Correct? Score   What year is it? {YES NO:22349} {Numbers; 0-4:31231} Yes = 0    No = 4  What month is it? {YES NO:22349} {Numbers; 0-4:31231} Yes = 0    No = 3  Remember:     Pia Mau, [place], [place]     What time is it? {YES NO:22349} {Numbers; 0-4:31231} Yes = 0    No = 3  Count backwards from 20 to 1 {YES NO:22349} {Numbers; 0-4:31231} Correct = 0    1 error = 2   More than 1 error = 4  Say the months of the year in reverse. {YES NO:22349} {Numbers; 0-4:31231} Correct = 0    1 error = 2   More than 1 error = 4  What address did I ask you to remember? {YES NO:22349} {NUMBERS; 0-10:5044} Correct = 0  1 error = 2    2 error = 4    3 error = 6    4 error = 8    All wrong = 10       TOTAL SCORE  {Numbers; 2-67:12458}/09   Interpretation:  {Desc;  normal/abnormal:11317::"Normal"}  Normal (0-7) Abnormal (8-28)   Results for orders placed or performed in visit on 08/28/22  Novel Coronavirus, NAA ([facility])   Specimen: Nasopharyngeal(NP) swabs in vial transport medium  Result Value Ref Range   SARS-CoV-2, NAA Not Detected Not Detected  Veritor Flu A/B Waived  Result Value Ref Range   Influenza A Negative Negative   Influenza B Negative Negative      Assessment & Plan:   Problem List Items Addressed This Visit       Cardiovascular and Mediastinum   Aortic atherosclerosis ([place]) - Primary     Preventative Services:  AAA screening:  Health Risk Assessment and Personalized Prevention Plan: Bone Mass Measurements: Breast Cancer Screening: CVD Screening:  Cervical Cancer Screening: Colon Cancer Screening:  Depression Screening:  Diabetes Screening:  Glaucoma Screening:  Hepatitis B vaccine: Hepatitis C screening:  HIV Screening: Flu Vaccine: Lung cancer Screening: Obesity Screening:  Pneumonia Vaccines (2): STI Screening:  Follow up plan: No follow-ups on file.   LABORATORY TESTING:  - Pap smear: {Blank XIPJAS:50539::"JQB done","not applicable","up to date","done elsewhere"}  IMMUNIZATIONS:   - Tdap: Tetanus vaccination status reviewed: {tetanus status:315746}. - Influenza: {Blank single:19197::"Up to date","Administered today","Postponed to  flu season","Refused","Given elsewhere"} - Pneumovax: {Blank single:19197::"Up to date","Administered today","Not applicable","Refused","Given elsewhere"} - Prevnar: {Blank single:19197::"Up to date","Administered today","Not applicable","Refused","Given elsewhere"} - Zostavax vaccine: {Blank single:19197::"Up to date","Administered today","Not applicable","Refused","Given elsewhere"}  SCREENING: -Mammogram: {Blank single:19197::"Up to date","Ordered today","Not applicable","Refused","Done elsewhere"}  - Colonoscopy: {Blank single:19197::"Up to date","Ordered today","Not  applicable","Refused","Done elsewhere"}  - Bone Density: {Blank single:19197::"Up to date","Ordered today","Not applicable","Refused","Done elsewhere"}  -Hearing Test: {Blank single:19197::"Up to date","Ordered today","Not applicable","Refused","Done elsewhere"}  -Spirometry: {Blank single:19197::"Up to date","Ordered today","Not applicable","Refused","Done elsewhere"}   PATIENT COUNSELING:   Advised to take 1 mg of folate supplement per day if capable of pregnancy.   Sexuality: Discussed sexually transmitted diseases, partner selection, use of condoms, avoidance of unintended pregnancy  and contraceptive alternatives.   Advised to avoid cigarette smoking.  I discussed with the patient that most people either abstain from alcohol or drink within safe limits (<=14/week and <=4 drinks/occasion for males, <=7/weeks and <= 3 drinks/occasion for females) and that the risk for alcohol disorders and other health effects rises proportionally with the number of drinks per week and how often a drinker exceeds daily limits.  Discussed cessation/primary prevention of drug use and availability of treatment for abuse.   Diet: Encouraged to adjust caloric intake to maintain  or achieve ideal body weight, to reduce intake of dietary saturated fat and total fat, to limit sodium intake by avoiding high sodium foods and not adding table salt, and to maintain adequate dietary potassium and calcium preferably from fresh fruits, vegetables, and low-fat dairy products.    stressed the importance of regular exercise  Injury prevention: Discussed safety belts, safety helmets, smoke detector, smoking near bedding or upholstery.   Dental health: Discussed importance of regular tooth brushing, flossing, and dental visits.    NEXT PREVENTATIVE PHYSICAL DUE IN 1 YEAR. No follow-ups on file.

## 2022-11-19 ENCOUNTER — Ambulatory Visit (INDEPENDENT_AMBULATORY_CARE_PROVIDER_SITE_OTHER): Payer: Medicare HMO | Admitting: Nurse Practitioner

## 2022-11-19 ENCOUNTER — Telehealth: Payer: Self-pay

## 2022-11-19 ENCOUNTER — Encounter: Payer: Self-pay | Admitting: Nurse Practitioner

## 2022-11-19 VITALS — BP 131/66 | HR 74 | Temp 97.7°F | Ht 61.0 in | Wt 111.1 lb

## 2022-11-19 DIAGNOSIS — R32 Unspecified urinary incontinence: Secondary | ICD-10-CM | POA: Diagnosis not present

## 2022-11-19 DIAGNOSIS — D692 Other nonthrombocytopenic purpura: Secondary | ICD-10-CM | POA: Diagnosis not present

## 2022-11-19 DIAGNOSIS — E559 Vitamin D deficiency, unspecified: Secondary | ICD-10-CM | POA: Diagnosis not present

## 2022-11-19 DIAGNOSIS — I700 Atherosclerosis of aorta: Secondary | ICD-10-CM | POA: Diagnosis not present

## 2022-11-19 DIAGNOSIS — E7800 Pure hypercholesterolemia, unspecified: Secondary | ICD-10-CM

## 2022-11-19 DIAGNOSIS — Z0000 Encounter for general adult medical examination without abnormal findings: Secondary | ICD-10-CM

## 2022-11-19 DIAGNOSIS — Z1231 Encounter for screening mammogram for malignant neoplasm of breast: Secondary | ICD-10-CM

## 2022-11-19 DIAGNOSIS — Z7189 Other specified counseling: Secondary | ICD-10-CM | POA: Diagnosis not present

## 2022-11-19 MED ORDER — DICLOFENAC SODIUM 1 % EX GEL: 2.0000 g | Freq: Four times a day (QID) | CUTANEOUS | 1 refills | Status: DC

## 2022-11-19 MED ORDER — NAPROXEN 500 MG PO TABS: 500.0000 mg | ORAL_TABLET | Freq: Two times a day (BID) | ORAL | 1 refills | Status: DC

## 2022-11-19 NOTE — Telephone Encounter (Signed)
[facility] [place] [facility] and scheduled the patient's mammogram for 12/10/22 at 10:40 AM.    Called and LVM asking for patient to please return my call for appointment date and time.    OK for [facility] to speak to patient and advise her of the date, time, and location for the mammogram.

## 2022-11-19 NOTE — Assessment & Plan Note (Signed)
Found on CT 12/11/20. Stable. Labs ordered today. Will make recommendations based on lab results.

## 2022-11-19 NOTE — Assessment & Plan Note (Signed)
A voluntary discussion about advance care planning including the explanation and discussion of advance directives was extensively discussed  with the patient for 10 minutes with patient.  Explanation about the health care proxy and Living will was reviewed and packet with forms with explanation of how to fill them out was given.  During this discussion, the patient was able to identify a health care proxy as her daughter and plans to fill out the paperwork required.  Patient was offered a separate [place] visit for further assistance with forms.

## 2022-11-19 NOTE — Assessment & Plan Note (Signed)
Reassured patient, continue to monitor.

## 2022-11-19 NOTE — Assessment & Plan Note (Signed)
Chronic.  Controlled.  Continue with current medication regimen.  Labs ordered today.  Return to clinic in 6 months for reevaluation.  Call sooner if concerns arise.  ? ?

## 2022-11-19 NOTE — Telephone Encounter (Signed)
Error

## 2022-11-19 NOTE — Telephone Encounter (Signed)
-----   Message from Georgina Peer, [place] sent at 11/19/2022  9:12 AM EST ----- Schedule mammogram- Wednesday is preferred

## 2022-11-19 NOTE — Assessment & Plan Note (Signed)
Labs ordered at visit today.  Will make recommendations based on lab results.   

## 2022-11-20 LAB — COMPREHENSIVE METABOLIC PANEL
ALT: 10 IU/L (ref 0–32)
AST: 17 IU/L (ref 0–40)
Albumin/Globulin Ratio: 1.9 (ref 1.2–2.2)
Albumin: 4.2 g/dL (ref 3.8–4.8)
Alkaline Phosphatase: 98 IU/L (ref 44–121)
BUN/Creatinine Ratio: 13 (ref 12–28)
BUN: 10 mg/dL (ref 8–27)
Bilirubin Total: 0.3 mg/dL (ref 0.0–1.2)
CO2: 30 mmol/L — ABNORMAL HIGH (ref 20–29)
Calcium: 9.6 mg/dL (ref 8.7–10.3)
Chloride: 101 mmol/L (ref 96–106)
Creatinine, Ser: 0.76 mg/dL (ref 0.57–1.00)
Globulin, Total: 2.2 g/dL (ref 1.5–4.5)
Glucose: 90 mg/dL (ref 70–99)
Potassium: 4.2 mmol/L (ref 3.5–5.2)
Sodium: 141 mmol/L (ref 134–144)
Total Protein: 6.4 g/dL (ref 6.0–8.5)
eGFR: 80 mL/min/{1.73_m2} (ref >59)

## 2022-11-20 LAB — LIPID PANEL
Chol/HDL Ratio: 4 ratio (ref 0.0–4.4)
Cholesterol, Total: 210 mg/dL — ABNORMAL HIGH (ref 100–199)
HDL: 53 mg/dL (ref >39)
LDL Chol Calc (NIH): 136 mg/dL — ABNORMAL HIGH (ref 0–99)
Triglycerides: 119 mg/dL (ref 0–149)
VLDL Cholesterol Cal: 21 mg/dL (ref 5–40)

## 2022-11-20 LAB — VITAMIN D 25 HYDROXY (VIT D DEFICIENCY, FRACTURES): Vit D, 25-Hydroxy: 54.2 ng/mL (ref 30.0–100.0)

## 2022-11-20 NOTE — Progress Notes (Signed)
Please let patient know that her lab work looks good.  Liver, kidneys and electrolytes look good.  Cholesterol is stable.  No concerns at this time.  Continue with current medication regimen.

## 2022-11-26 DIAGNOSIS — R059 Cough, unspecified: Secondary | ICD-10-CM | POA: Diagnosis not present

## 2022-11-26 DIAGNOSIS — Z20822 Contact with and (suspected) exposure to covid-19: Secondary | ICD-10-CM | POA: Diagnosis not present

## 2022-11-26 DIAGNOSIS — J069 Acute upper respiratory infection, unspecified: Secondary | ICD-10-CM | POA: Diagnosis not present

## 2022-12-24 ENCOUNTER — Ambulatory Visit
Admission: RE | Admit: 2022-12-24 | Discharge: 2022-12-24 | Disposition: A | Discharge status: Home / Self Care | Payer: Medicare HMO | Source: Ambulatory Visit | Attending: Nurse Practitioner | Admitting: Nurse Practitioner

## 2022-12-24 DIAGNOSIS — Z1231 Encounter for screening mammogram for malignant neoplasm of breast: Secondary | ICD-10-CM | POA: Diagnosis not present

## 2022-12-26 NOTE — Progress Notes (Signed)
Please let patient know her Mammogram did not show any evidence of a malignancy.  The recommendation is to repeat the Mammogram in 1 year.  

## 2023-01-14 ENCOUNTER — Telehealth: Payer: Self-pay | Admitting: Nurse Practitioner

## 2023-01-14 NOTE — Telephone Encounter (Signed)
Pt is calling iask why she was switch to [name]? In 2022 Pt was a pt of Dr. Wynetta Emery. And was switched to [name]. Pt would liked to be switched to Dr. Wynetta Emery.   Pt is calling to schedule CPE with PCP.  CB- [phone]

## 2023-01-19 NOTE — Telephone Encounter (Signed)
Patient returned call back about switching pcp to Dr Mathis Dad

## 2023-01-19 NOTE — Telephone Encounter (Signed)
LMOM for patient to return my call regarding PCP switch.

## 2023-02-04 NOTE — Telephone Encounter (Signed)
Spoke with patient regarding PCP switch. Patient stated that she is pleased with seeing [name] but that she and her daughter prefer that she sees a physician due to her current age. Advised patient that a one time switch would be granted. Patient acknowledged understanding.

## 2023-03-18 ENCOUNTER — Encounter: Payer: Medicare HMO | Admitting: Obstetrics & Gynecology

## 2023-04-15 DIAGNOSIS — H524 Presbyopia: Secondary | ICD-10-CM | POA: Diagnosis not present

## 2023-05-13 DIAGNOSIS — H18513 Endothelial corneal dystrophy, bilateral: Secondary | ICD-10-CM | POA: Diagnosis not present

## 2023-05-13 DIAGNOSIS — H2513 Age-related nuclear cataract, bilateral: Secondary | ICD-10-CM | POA: Diagnosis not present

## 2023-05-13 DIAGNOSIS — H02831 Dermatochalasis of right upper eyelid: Secondary | ICD-10-CM | POA: Diagnosis not present

## 2023-05-13 DIAGNOSIS — H43393 Other vitreous opacities, bilateral: Secondary | ICD-10-CM | POA: Diagnosis not present

## 2023-05-13 DIAGNOSIS — H532 Diplopia: Secondary | ICD-10-CM | POA: Diagnosis not present

## 2023-05-13 DIAGNOSIS — H02834 Dermatochalasis of left upper eyelid: Secondary | ICD-10-CM | POA: Diagnosis not present

## 2023-05-20 ENCOUNTER — Telehealth: Payer: Self-pay | Admitting: Family Medicine

## 2023-05-20 ENCOUNTER — Encounter: Payer: Medicare HMO | Admitting: Nurse Practitioner

## 2023-05-20 ENCOUNTER — Ambulatory Visit (INDEPENDENT_AMBULATORY_CARE_PROVIDER_SITE_OTHER): Payer: Medicare HMO | Admitting: Family Medicine

## 2023-05-20 ENCOUNTER — Encounter: Payer: Self-pay | Admitting: Family Medicine

## 2023-05-20 VITALS — BP 109/63 | HR 76 | Temp 98.4°F | Ht 62.5 in | Wt 109.4 lb

## 2023-05-20 DIAGNOSIS — D510 Vitamin B12 deficiency anemia due to intrinsic factor deficiency: Secondary | ICD-10-CM | POA: Diagnosis not present

## 2023-05-20 DIAGNOSIS — I700 Atherosclerosis of aorta: Secondary | ICD-10-CM

## 2023-05-20 DIAGNOSIS — Z13820 Encounter for screening for osteoporosis: Secondary | ICD-10-CM | POA: Diagnosis not present

## 2023-05-20 DIAGNOSIS — J309 Allergic rhinitis, unspecified: Secondary | ICD-10-CM

## 2023-05-20 DIAGNOSIS — D692 Other nonthrombocytopenic purpura: Secondary | ICD-10-CM

## 2023-05-20 DIAGNOSIS — E559 Vitamin D deficiency, unspecified: Secondary | ICD-10-CM

## 2023-05-20 DIAGNOSIS — E7800 Pure hypercholesterolemia, unspecified: Secondary | ICD-10-CM | POA: Diagnosis not present

## 2023-05-20 DIAGNOSIS — Z0000 Encounter for general adult medical examination without abnormal findings: Secondary | ICD-10-CM

## 2023-05-20 DIAGNOSIS — N393 Stress incontinence (female) (male): Secondary | ICD-10-CM

## 2023-05-20 DIAGNOSIS — Z1231 Encounter for screening mammogram for malignant neoplasm of breast: Secondary | ICD-10-CM

## 2023-05-20 DIAGNOSIS — Z1211 Encounter for screening for malignant neoplasm of colon: Secondary | ICD-10-CM | POA: Diagnosis not present

## 2023-05-20 DIAGNOSIS — H524 Presbyopia: Secondary | ICD-10-CM | POA: Diagnosis not present

## 2023-05-20 LAB — URINALYSIS, ROUTINE W REFLEX MICROSCOPIC
Bilirubin, UA: NEGATIVE
Glucose, UA: NEGATIVE
Ketones, UA: NEGATIVE
Leukocytes,UA: NEGATIVE
Nitrite, UA: NEGATIVE
Protein,UA: NEGATIVE
RBC, UA: NEGATIVE
Specific Gravity, UA: 1.01 (ref 1.005–1.030)
Urobilinogen, Ur: 0.2 mg/dL (ref 0.2–1.0)
pH, UA: 7.5 (ref 5.0–7.5)

## 2023-05-20 MED ORDER — TIZANIDINE HCL 4 MG PO TABS: 4.0000 mg | ORAL_TABLET | Freq: Three times a day (TID) | ORAL | 1 refills | Status: DC | PRN

## 2023-05-20 MED ORDER — CETIRIZINE HCL 10 MG PO TABS: 10.0000 mg | ORAL_TABLET | Freq: Every day | ORAL | 3 refills | Status: DC

## 2023-05-20 MED ORDER — ONDANSETRON 4 MG PO TBDP: 4.0000 mg | ORAL_TABLET | Freq: Three times a day (TID) | ORAL | 1 refills | Status: DC | PRN

## 2023-05-20 MED ORDER — FLUTICASONE PROPIONATE 50 MCG/ACT NA SUSP: 2.0000 | Freq: Every day | NASAL | 2 refills | Status: DC

## 2023-05-20 MED ORDER — CLOBETASOL PROPIONATE 0.05 % EX OINT: TOPICAL_OINTMENT | CUTANEOUS | 2 refills | Status: DC

## 2023-05-20 NOTE — Telephone Encounter (Signed)
Copied from CRM [id number]. Topic: General - Other >> May 20, 2023  1:36 PM Pincus Sanes wrote: Reason for CRM: Pt was just in office for exam today and Dr Shela Commons mentioned to her about doing exercises for her bladder and she was thinking she would get them in her discharge papers. Please fu to advise. [phone]

## 2023-05-20 NOTE — Progress Notes (Unsigned)
BP 109/63   Pulse 76   Temp 98.4 F (36.9 C) (Oral)   Ht 5' 2.5" (1.588 m)   Wt 109 lb 6.4 oz (49.6 kg)   SpO2 96%   BMI 19.69 kg/m    Subjective:    Patient ID: Kayla Johnson, female    DOB: 04-Apr-1946, 77 y.o.   MRN: [id number]  HPI: Kayla Johnson is a 77 y.o. female presenting on 05/20/2023 for comprehensive medical examination. Current medical complaints include:  Urinary incontinence  Menopausal Symptoms: no  Depression Screen done today and results listed below:     11/19/2022    9:27 AM 08/28/2022    4:05 PM 03/05/2022   10:51 AM 10/23/2021   11:38 AM 08/21/2020    3:58 PM  Depression screen PHQ 2/9  Decreased Interest 0 0 0 0 0  Down, Depressed, Hopeless 0 0 0 0 0  PHQ - 2 Score 0 0 0 0 0  Altered sleeping 0 0 0 0 0  Tired, decreased energy 0 1 0 0 3  Change in appetite 0 0 0 0 0  Feeling bad or failure about yourself  0 0 0 0 0  Trouble concentrating 0 0 0 0 0  Moving slowly or fidgety/restless 0 0 0 0 0  Suicidal thoughts 0 0 0 0 0  PHQ-9 Score 0 1 0 0 3  Difficult doing work/chores Not difficult at all Not difficult at all Not difficult at all  Not difficult at all    Past Medical History:  Past Medical History:  Diagnosis Date   Carpal tunnel syndrome    COVID-19 12/06/2020   reported by patient   GERD (gastroesophageal reflux disease)    Lichen sclerosus    Pernicious anemia    Shoulder bursitis     Surgical History:  Past Surgical History:  Procedure Laterality Date   ABDOMINAL HYSTERECTOMY     ESOPHAGOGASTRODUODENOSCOPY (EGD) WITH PROPOFOL N/A 09/12/2020   Procedure: ESOPHAGOGASTRODUODENOSCOPY (EGD) WITH PROPOFOL;  Surgeon: Toledo, Boykin Nearing, MD;  Location: [facility] ENDOSCOPY;  Service: Gastroenterology;  Laterality: N/A;   Molar Pregnancy     S/P D & C   TONSILLECTOMY     Tubes & Ovaries removed  2015    Medications:  Current Outpatient Medications on File Prior to Visit  Medication Sig   cetirizine (ZYRTEC) 10 MG tablet Take 1 tablet (10  mg total) by mouth daily.   Cyanocobalamin (VITAMIN B 12 PO) Take by mouth daily.    diclofenac Sodium (VOLTAREN) 1 % GEL Apply 2 g topically 4 (four) times daily.   Multiple Vitamins-Minerals (CENTRUM SILVER 50+WOMEN PO) Take by mouth.   Simethicone (GAS-X PO) Take by mouth.   vitamin E 180 MG (400 UNITS) capsule Take 400 Units by mouth daily.   fluticasone (FLONASE) 50 MCG/ACT nasal spray Place 2 sprays into both nostrils daily. (Patient not taking: Reported on 05/20/2023)   naproxen (NAPROSYN) 500 MG tablet Take 1 tablet (500 mg total) by mouth 2 (two) times daily. (Patient not taking: Reported on 05/20/2023)   ondansetron (ZOFRAN ODT) 4 MG disintegrating tablet Take 1 tablet (4 mg total) by mouth every 8 (eight) hours as needed. (Patient not taking: Reported on 05/20/2023)   tiZANidine (ZANAFLEX) 4 MG tablet Take 1 tablet (4 mg total) by mouth every 8 (eight) hours as needed. (Patient not taking: Reported on 11/19/2022)   No current facility-administered medications on file prior to visit.    Allergies:  Allergies  Allergen  Reactions   Lidocaine Tinitus    Social History:  Social History   Socioeconomic History   Marital status: Widowed    Spouse name: Not on file   Number of children: Not on file   Years of education: Not on file   Highest education level: Not on file  Occupational History   Not on file  Tobacco Use   Smoking status: Never   Smokeless tobacco: Never  Vaping Use   Vaping Use: Never used  Substance and Sexual Activity   Alcohol use: No    Alcohol/week: 0.0 standard drinks of alcohol   Drug use: No   Sexual activity: Not Currently    Birth control/protection: Post-menopausal  Other Topics Concern   Not on file  Social History Narrative   Works 32 hours a week at [employer] Strain: Low Risk  (04/14/2018)   Overall Financial Resource Strain (CARDIA)    Difficulty of Paying Living Expenses: Not hard at  all  Food Insecurity: No Food Insecurity (04/14/2018)   Hunger Vital Sign    Worried About Running Out of Food in the Last Year: Never true    Ran Out of Food in the Last Year: Never true  Transportation Needs: No Transportation Needs (04/14/2018)   PRAPARE - Administrator, Civil Service (Medical): No    Lack of Transportation (Non-Medical): No  Physical Activity: Inactive (04/14/2018)   Exercise Vital Sign    Days of Exercise per Week: 0 days    Minutes of Exercise per Session: 0 min  Stress: No Stress Concern Present (04/14/2018)   [employer] of Occupational Health - Occupational Stress Questionnaire    Feeling of Stress : Not at all  Social Connections: Somewhat Isolated (04/14/2018)   Social Connection and Isolation Panel [NHANES]    Frequency of Communication with Friends and Family: More than three times a week    Frequency of Social Gatherings with Friends and Family: More than three times a week    Attends Religious Services: More than 4 times per year    Active Member of [employer] or Organizations: No    Attends Banker Meetings: Never    Marital Status: Widowed  Intimate Partner Violence: Not At Risk (04/14/2018)   Humiliation, Afraid, Rape, and Kick questionnaire    Fear of Current or Ex-Partner: No    Emotionally Abused: No    Physically Abused: No    Sexually Abused: No   Social History   Tobacco Use  Smoking Status Never  Smokeless Tobacco Never   Social History   Substance and Sexual Activity  Alcohol Use No   Alcohol/week: 0.0 standard drinks of alcohol    Family History:  Family History  Problem Relation Age of Onset   Arthritis Mother    Diabetes Mother    Arthritis Father    Diabetes Sister    Stroke Sister    Diabetes Sister    Ovarian cancer Sister     Past medical history, surgical history, medications, allergies, family history and social history reviewed with patient today and changes made to appropriate areas of  the chart.   Review of Systems  Constitutional: Negative.   HENT: Negative.    Eyes: Negative.   Respiratory:  Positive for cough. Negative for hemoptysis, sputum production, shortness of breath and wheezing.   Cardiovascular:  Positive for leg swelling. Negative for chest pain, palpitations, orthopnea, claudication and PND.  Gastrointestinal:  Positive for heartburn. Negative for abdominal pain, blood in stool, constipation, diarrhea, melena, nausea and vomiting.  Genitourinary: Negative.        Occasionally stress incontinence  Musculoskeletal: Negative.   Skin: Negative.   Neurological: Negative.   Endo/Heme/Allergies:  Positive for environmental allergies. Negative for polydipsia. Bruises/bleeds easily.  Psychiatric/Behavioral: Negative.     All other ROS negative except what is listed above and in the HPI.      Objective:    BP 109/63   Pulse 76   Temp 98.4 F (36.9 C) (Oral)   Ht 5' 2.5" (1.588 m)   Wt 109 lb 6.4 oz (49.6 kg)   SpO2 96%   BMI 19.69 kg/m   Wt Readings from Last 3 Encounters:  05/20/23 109 lb 6.4 oz (49.6 kg)  11/19/22 111 lb 1.6 oz (50.4 kg)  08/28/22 109 lb 12.8 oz (49.8 kg)    Physical Exam  Results for orders placed or performed in visit on 11/19/22  Comp Met (CMET)  Result Value Ref Range   Glucose 90 70 - 99 mg/dL   BUN 10 8 - 27 mg/dL   Creatinine, Ser 1.61 0.57 - 1.00 mg/dL   eGFR 80 >09 UE/AVW/0.98   BUN/Creatinine Ratio 13 12 - 28   Sodium 141 134 - 144 mmol/L   Potassium 4.2 3.5 - 5.2 mmol/L   Chloride 101 96 - 106 mmol/L   CO2 30 (H) 20 - 29 mmol/L   Calcium 9.6 8.7 - 10.3 mg/dL   Total Protein 6.4 6.0 - 8.5 g/dL   Albumin 4.2 3.8 - 4.8 g/dL   Globulin, Total 2.2 1.5 - 4.5 g/dL   Albumin/Globulin Ratio 1.9 1.2 - 2.2   Bilirubin Total 0.3 0.0 - 1.2 mg/dL   Alkaline Phosphatase 98 44 - 121 IU/L   AST 17 0 - 40 IU/L   ALT 10 0 - 32 IU/L  Lipid Profile  Result Value Ref Range   Cholesterol, Total 210 (H) 100 - 199 mg/dL    Triglycerides 119 0 - 149 mg/dL   HDL 53 >14 mg/dL   VLDL Cholesterol Cal 21 5 - 40 mg/dL   LDL Chol Calc (NIH) 782 (H) 0 - 99 mg/dL   Chol/HDL Ratio 4.0 0.0 - 4.4 ratio  Vitamin D (25 hydroxy)  Result Value Ref Range   Vit D, 25-Hydroxy 54.2 30.0 - 100.0 ng/mL      Assessment & Plan:   Problem List Items Addressed This Visit       Cardiovascular and Mediastinum   Senile purpura (HCC)   Relevant Orders   CBC with Differential/Platelet   Comprehensive metabolic panel   Urinalysis, Routine w reflex microscopic   TSH   Aortic atherosclerosis (HCC) - Primary   Relevant Orders   Comprehensive metabolic panel   Lipid Panel w/o Chol/HDL Ratio   Urinalysis, Routine w reflex microscopic     Other   Pernicious anemia   Relevant Orders   Comprehensive metabolic panel   Urinalysis, Routine w reflex microscopic   B12   Vitamin D deficiency   Relevant Orders   VITAMIN D 25 Hydroxy (Vit-D Deficiency, Fractures)   Comprehensive metabolic panel   Urinalysis, Routine w reflex microscopic   Hypercholesteremia   Relevant Orders   Comprehensive metabolic panel   Lipid Panel w/o Chol/HDL Ratio   Urinalysis, Routine w reflex microscopic     Follow up plan: No follow-ups on file.   LABORATORY TESTING:  - Pap smear: not  applicable  IMMUNIZATIONS:   - Tdap: Tetanus vaccination status reviewed: Refused. - Influenza: Postponed to flu season - Pneumovax: Up to date - Prevnar: Up to date - COVID: Refused - HPV: Not applicable - Shingrix vaccine: Refused  SCREENING: -Mammogram: Ordered today  - Colonoscopy: Ordered today  - Bone Density: Ordered today   PATIENT COUNSELING:   Advised to take 1 mg of folate supplement per day if capable of pregnancy.   Sexuality: Discussed sexually transmitted diseases, partner selection, use of condoms, avoidance of unintended pregnancy  and contraceptive alternatives.   Advised to avoid cigarette smoking.  I discussed with the patient that  most people either abstain from alcohol or drink within safe limits (<=14/week and <=4 drinks/occasion for males, <=7/weeks and <= 3 drinks/occasion for females) and that the risk for alcohol disorders and other health effects rises proportionally with the number of drinks per week and how often a drinker exceeds daily limits.  Discussed cessation/primary prevention of drug use and availability of treatment for abuse.   Diet: Encouraged to adjust caloric intake to maintain  or achieve ideal body weight, to reduce intake of dietary saturated fat and total fat, to limit sodium intake by avoiding high sodium foods and not adding table salt, and to maintain adequate dietary potassium and calcium preferably from fresh fruits, vegetables, and low-fat dairy products.    stressed the importance of regular exercise  Injury prevention: Discussed safety belts, safety helmets, smoke detector, smoking near bedding or upholstery.   Dental health: Discussed importance of regular tooth brushing, flossing, and dental visits.    NEXT PREVENTATIVE PHYSICAL DUE IN 1 YEAR. No follow-ups on file.

## 2023-05-20 NOTE — Telephone Encounter (Signed)
This is a referral for physical therapy. They will be calling her.

## 2023-05-20 NOTE — Telephone Encounter (Signed)
Looked at AVS and there were no exercises added on there. Please advise.

## 2023-05-20 NOTE — Patient Instructions (Signed)
Please call to schedule your mammogram and bone density for the end of January 2025: [facility] at [facility]  Address: [address], [place], [place] [address] Phone: [phone]  [facility] [address] [place],  [place]  [address] Phone: [phone]

## 2023-05-21 ENCOUNTER — Encounter: Payer: Self-pay | Admitting: Family Medicine

## 2023-05-21 LAB — COMPREHENSIVE METABOLIC PANEL
ALT: 11 IU/L (ref 0–32)
AST: 18 IU/L (ref 0–40)
Albumin: 4.2 g/dL (ref 3.8–4.8)
Alkaline Phosphatase: 84 IU/L (ref 44–121)
BUN/Creatinine Ratio: 17 (ref 12–28)
BUN: 12 mg/dL (ref 8–27)
Bilirubin Total: 0.4 mg/dL (ref 0.0–1.2)
CO2: 27 mmol/L (ref 20–29)
Calcium: 9 mg/dL (ref 8.7–10.3)
Chloride: 101 mmol/L (ref 96–106)
Creatinine, Ser: 0.72 mg/dL (ref 0.57–1.00)
Globulin, Total: 1.9 g/dL (ref 1.5–4.5)
Glucose: 89 mg/dL (ref 70–99)
Potassium: 4.4 mmol/L (ref 3.5–5.2)
Sodium: 140 mmol/L (ref 134–144)
Total Protein: 6.1 g/dL (ref 6.0–8.5)
eGFR: 86 mL/min/{1.73_m2} (ref >59)

## 2023-05-21 LAB — CBC WITH DIFFERENTIAL/PLATELET
Basophils Absolute: 0 10*3/uL (ref 0.0–0.2)
Basos: 0 %
EOS (ABSOLUTE): 0.1 10*3/uL (ref 0.0–0.4)
Eos: 1 %
Hematocrit: 37.6 % (ref 34.0–46.6)
Hemoglobin: 11.9 g/dL (ref 11.1–15.9)
Immature Grans (Abs): 0 10*3/uL (ref 0.0–0.1)
Immature Granulocytes: 0 %
Lymphocytes Absolute: 1.8 10*3/uL (ref 0.7–3.1)
Lymphs: 32 %
MCH: 26.9 pg (ref 26.6–33.0)
MCHC: 31.6 g/dL (ref 31.5–35.7)
MCV: 85 fL (ref 79–97)
Monocytes Absolute: 0.4 10*3/uL (ref 0.1–0.9)
Monocytes: 7 %
Neutrophils Absolute: 3.4 10*3/uL (ref 1.4–7.0)
Neutrophils: 60 %
Platelets: 286 10*3/uL (ref 150–450)
RBC: 4.43 x10E6/uL (ref 3.77–5.28)
RDW: 14 % (ref 11.7–15.4)
WBC: 5.8 10*3/uL (ref 3.4–10.8)

## 2023-05-21 LAB — TSH: TSH: 1.42 u[IU]/mL (ref 0.450–4.500)

## 2023-05-21 LAB — VITAMIN D 25 HYDROXY (VIT D DEFICIENCY, FRACTURES): Vit D, 25-Hydroxy: 43.5 ng/mL (ref 30.0–100.0)

## 2023-05-21 LAB — LIPID PANEL W/O CHOL/HDL RATIO
Cholesterol, Total: 205 mg/dL — ABNORMAL HIGH (ref 100–199)
HDL: 61 mg/dL (ref >39)
LDL Chol Calc (NIH): 133 mg/dL — ABNORMAL HIGH (ref 0–99)
Triglycerides: 61 mg/dL (ref 0–149)
VLDL Cholesterol Cal: 11 mg/dL (ref 5–40)

## 2023-05-21 LAB — VITAMIN B12: Vitamin B-12: 1427 pg/mL — ABNORMAL HIGH (ref 232–1245)

## 2023-05-21 NOTE — Assessment & Plan Note (Signed)
Rechecking labs today. Await results. Treat as needed.  °

## 2023-05-21 NOTE — Assessment & Plan Note (Signed)
Reassured patient. Continue to monitor. Call with any concerns.  

## 2023-05-21 NOTE — Telephone Encounter (Signed)
Spoke with patient and made her aware of Dr Henriette Combs recommendations. Patient verbalized understanding and has no further questions at this time.

## 2023-05-21 NOTE — Assessment & Plan Note (Signed)
Under good control on current regimen. Continue current regimen. Continue to monitor. Call with any concerns. Refills given.   

## 2023-05-21 NOTE — Assessment & Plan Note (Signed)
Under good control on current regimen. Continue current regimen. Continue to monitor. Call with any concerns. Refills given. Labs drawn today.   

## 2023-05-21 NOTE — Assessment & Plan Note (Signed)
Will keep BP and cholesterol under good control. Continue to monitor. Call with any concerns. Continue to monitor. ?

## 2023-06-01 DIAGNOSIS — Z1211 Encounter for screening for malignant neoplasm of colon: Secondary | ICD-10-CM | POA: Diagnosis not present

## 2023-06-08 LAB — COLOGUARD: COLOGUARD: NEGATIVE

## 2023-06-08 NOTE — Progress Notes (Signed)
Contacted via MyChart   Cologuard is negative, repeat in 3 years.

## 2023-07-15 ENCOUNTER — Ambulatory Visit: Payer: Medicare HMO | Admitting: Podiatry

## 2023-07-22 ENCOUNTER — Ambulatory Visit: Payer: Medicare HMO | Admitting: Podiatry

## 2023-07-22 ENCOUNTER — Ambulatory Visit: Payer: Medicare HMO

## 2023-07-22 ENCOUNTER — Encounter: Payer: Self-pay | Admitting: Podiatry

## 2023-07-22 DIAGNOSIS — D2372 Other benign neoplasm of skin of left lower limb, including hip: Secondary | ICD-10-CM | POA: Diagnosis not present

## 2023-07-22 DIAGNOSIS — D2371 Other benign neoplasm of skin of right lower limb, including hip: Secondary | ICD-10-CM | POA: Diagnosis not present

## 2023-07-22 DIAGNOSIS — M2011 Hallux valgus (acquired), right foot: Secondary | ICD-10-CM | POA: Diagnosis not present

## 2023-07-23 NOTE — Progress Notes (Signed)
She presents today concerned about the medial aspect of her right hallux Halla starting to develop a callus there.  She is also concerned about areas on the plantar aspect of the right foot that are tender.  And there is 1 in particular along the plantar lateral aspect of the right foot that is most exquisitely tender.  She states that she does have a bunion and it just gets red sometimes with shoes.  Objective: Vital signs are stable she alert oriented x 3.  There is no erythema edema salines drainage or odor hallux valgus is noted right over left.  She does have a very deep cornified porokeratotic lesion to the plantar lateral aspect of the right foot.  This was enucleated today.  I debrided all reactive hyperkeratotic tissue.  Assessment: Multiple benign skin lesions bilateral hallux valgus bilateral.  Plan: Discussed the etiology pathology conservative surgical therapies with her.  Debrided all benign skin lesions today we will follow-up with her on an as-needed basis.

## 2023-08-19 DIAGNOSIS — H02831 Dermatochalasis of right upper eyelid: Secondary | ICD-10-CM | POA: Diagnosis not present

## 2023-08-19 DIAGNOSIS — H02834 Dermatochalasis of left upper eyelid: Secondary | ICD-10-CM | POA: Diagnosis not present

## 2023-10-14 DIAGNOSIS — Z872 Personal history of diseases of the skin and subcutaneous tissue: Secondary | ICD-10-CM | POA: Diagnosis not present

## 2023-10-14 DIAGNOSIS — L578 Other skin changes due to chronic exposure to nonionizing radiation: Secondary | ICD-10-CM | POA: Diagnosis not present

## 2023-10-14 DIAGNOSIS — L570 Actinic keratosis: Secondary | ICD-10-CM | POA: Diagnosis not present

## 2023-10-14 DIAGNOSIS — L900 Lichen sclerosus et atrophicus: Secondary | ICD-10-CM | POA: Diagnosis not present

## 2023-10-14 DIAGNOSIS — L720 Epidermal cyst: Secondary | ICD-10-CM | POA: Diagnosis not present

## 2023-12-30 ENCOUNTER — Other Ambulatory Visit: Payer: Medicare HMO

## 2024-01-04 ENCOUNTER — Ambulatory Visit: Payer: No Typology Code available for payment source | Attending: Nurse Practitioner

## 2024-01-04 ENCOUNTER — Ambulatory Visit: Payer: No Typology Code available for payment source | Admitting: Nurse Practitioner

## 2024-01-04 ENCOUNTER — Ambulatory Visit: Payer: Self-pay

## 2024-01-04 ENCOUNTER — Encounter: Payer: Self-pay | Admitting: Nurse Practitioner

## 2024-01-04 VITALS — BP 129/80 | HR 85 | Ht 61.5 in | Wt 110.4 lb

## 2024-01-04 DIAGNOSIS — R000 Tachycardia, unspecified: Secondary | ICD-10-CM | POA: Diagnosis not present

## 2024-01-04 DIAGNOSIS — I471 Supraventricular tachycardia, unspecified: Secondary | ICD-10-CM

## 2024-01-04 NOTE — Progress Notes (Addendum)
 BP 129/80 (BP Location: Right Arm, Patient Position: Sitting, Cuff Size: Large)   Pulse 85   Ht 5' 1.5" (1.562 m)   Wt 110 lb 6.4 oz (50.1 kg)   SpO2 96%   BMI 20.52 kg/m    Subjective:    Patient ID: Kayla Johnson, female    DOB: 01/17/1946, 78 y.o.   MRN: [id number]  HPI: [name] is a 78 y.o. female  Chief Complaint  Patient presents with   Tachycardia    Possible Afib   TACHYCARDIA Patient states she has been having increased HR symptoms.  States it gets up to 150.  States it lasted about a minute then would come back down then would go up again.  She woke up this morning and her HR was in 160s.  She is monitoring it with her pulse ox.  Symptoms started yesterday.  She gets a little SOB with it happens.  Denies any chest pain or tightness.    Relevant past medical, surgical, family and social history reviewed and updated as indicated. Interim medical history since our last visit reviewed. Allergies and medications reviewed and updated.  Review of Systems  Respiratory:  Positive for shortness of breath. Negative for chest tightness.   Cardiovascular:  Negative for chest pain.       Tachycardia    Per HPI unless specifically indicated above     Objective:    BP 129/80 (BP Location: Right Arm, Patient Position: Sitting, Cuff Size: Large)   Pulse 85   Ht 5' 1.5" (1.562 m)   Wt 110 lb 6.4 oz (50.1 kg)   SpO2 96%   BMI 20.52 kg/m   Wt Readings from Last 3 Encounters:  01/04/24 110 lb 6.4 oz (50.1 kg)  05/20/23 109 lb 6.4 oz (49.6 kg)  11/19/22 111 lb 1.6 oz (50.4 kg)    Physical Exam Vitals and nursing note reviewed.  Constitutional:      General: She is not in acute distress.    Appearance: Normal appearance. She is normal weight. She is not ill-appearing, toxic-appearing or diaphoretic.  HENT:     Head: Normocephalic.     Right Ear: External ear normal.     Left Ear: External ear normal.     Nose: Nose normal.     Mouth/Throat:     Mouth: Mucous  membranes are moist.     Pharynx: Oropharynx is clear.  Eyes:     General:        Right eye: No discharge.        Left eye: No discharge.     Extraocular Movements: Extraocular movements intact.     Conjunctiva/sclera: Conjunctivae normal.     Pupils: Pupils are equal, round, and reactive to light.  Cardiovascular:     Rate and Rhythm: Normal rate. Rhythm irregular.     Heart sounds: No murmur heard. Pulmonary:     Effort: Pulmonary effort is normal. No respiratory distress.     Breath sounds: Normal breath sounds. No wheezing or rales.  Musculoskeletal:     Cervical back: Normal range of motion and neck supple.  Skin:    General: Skin is warm and dry.     Capillary Refill: Capillary refill takes less than 2 seconds.  Neurological:     General: No focal deficit present.     Mental Status: She is alert and oriented to person, place, and time. Mental status is at baseline.  Psychiatric:  Mood and Affect: Mood normal.        Behavior: Behavior normal.        Thought Content: Thought content normal.        Judgment: Judgment normal.     Results for orders placed or performed in visit on 05/20/23  Urinalysis, Routine w reflex microscopic   Collection Time: 05/20/23 11:11 AM  Result Value Ref Range   Specific Gravity, UA 1.010 1.005 - 1.030   pH, UA 7.5 5.0 - 7.5   Color, UA Yellow Yellow   Appearance Ur Clear Clear   Leukocytes,UA Negative Negative   Protein,UA Negative Negative/Trace   Glucose, UA Negative Negative   Ketones, UA Negative Negative   RBC, UA Negative Negative   Bilirubin, UA Negative Negative   Urobilinogen, Ur 0.2 0.2 - 1.0 mg/dL   Nitrite, UA Negative Negative   Microscopic Examination Comment   VITAMIN D 25 Hydroxy (Vit-D Deficiency, Fractures)   Collection Time: 05/20/23 11:13 AM  Result Value Ref Range   Vit D, 25-Hydroxy 43.5 30.0 - 100.0 ng/mL  CBC with Differential/Platelet   Collection Time: 05/20/23 11:13 AM  Result Value Ref Range    WBC 5.8 3.4 - 10.8 x10E3/uL   RBC 4.43 3.77 - 5.28 x10E6/uL   Hemoglobin 11.9 11.1 - 15.9 g/dL   Hematocrit 16.1 09.6 - 46.6 %   MCV 85 79 - 97 fL   MCH 26.9 26.6 - 33.0 pg   MCHC 31.6 31.5 - 35.7 g/dL   RDW 04.5 40.9 - 81.1 %   Platelets 286 150 - 450 x10E3/uL   Neutrophils 60 Not Estab. %   Lymphs 32 Not Estab. %   Monocytes 7 Not Estab. %   Eos 1 Not Estab. %   Basos 0 Not Estab. %   Neutrophils Absolute 3.4 1.4 - 7.0 x10E3/uL   Lymphocytes Absolute 1.8 0.7 - 3.1 x10E3/uL   Monocytes Absolute 0.4 0.1 - 0.9 x10E3/uL   EOS (ABSOLUTE) 0.1 0.0 - 0.4 x10E3/uL   Basophils Absolute 0.0 0.0 - 0.2 x10E3/uL   Immature Granulocytes 0 Not Estab. %   Immature Grans (Abs) 0.0 0.0 - 0.1 x10E3/uL  Comprehensive metabolic panel   Collection Time: 05/20/23 11:13 AM  Result Value Ref Range   Glucose 89 70 - 99 mg/dL   BUN 12 8 - 27 mg/dL   Creatinine, Ser 9.14 0.57 - 1.00 mg/dL   eGFR 86 >78 GN/FAO/1.30   BUN/Creatinine Ratio 17 12 - 28   Sodium 140 134 - 144 mmol/L   Potassium 4.4 3.5 - 5.2 mmol/L   Chloride 101 96 - 106 mmol/L   CO2 27 20 - 29 mmol/L   Calcium 9.0 8.7 - 10.3 mg/dL   Total Protein 6.1 6.0 - 8.5 g/dL   Albumin 4.2 3.8 - 4.8 g/dL   Globulin, Total 1.9 1.5 - 4.5 g/dL   Bilirubin Total 0.4 0.0 - 1.2 mg/dL   Alkaline Phosphatase 84 44 - 121 IU/L   AST 18 0 - 40 IU/L   ALT 11 0 - 32 IU/L  Lipid Panel w/o Chol/HDL Ratio   Collection Time: 05/20/23 11:13 AM  Result Value Ref Range   Cholesterol, Total 205 (H) 100 - 199 mg/dL   Triglycerides 61 0 - 149 mg/dL   HDL 61 >86 mg/dL   VLDL Cholesterol Cal 11 5 - 40 mg/dL   LDL Chol Calc (NIH) 578 (H) 0 - 99 mg/dL  TSH   Collection Time: 05/20/23 11:13 AM  Result  Value Ref Range   TSH 1.420 0.450 - 4.500 uIU/mL  B12   Collection Time: 05/20/23 11:13 AM  Result Value Ref Range   Vitamin B-12 1,427 (H) 232 - 1,245 pg/mL  Cologuard   Collection Time: 06/01/23  4:25 PM  Result Value Ref Range   COLOGUARD Negative Negative       Assessment & Plan:   Problem List Items Addressed This Visit   None Visit Diagnoses       Tachycardia    -  Primary   EKG in office was normal.  Suspect patient is in and out of AFIB. ZIO monitor ordered. Will follow up once results are returned.   Relevant Orders   EKG 12-Lead   LONG TERM MONITOR (3-14 DAYS) (Completed)     SVT (supraventricular tachycardia) (HCC)       Relevant Medications   metoprolol succinate (TOPROL-XL) 25 MG 24 hr tablet   Other Relevant Orders   Ambulatory referral to Cardiology        Follow up plan: No follow-ups on file.

## 2024-01-04 NOTE — Telephone Encounter (Signed)
  Chief Complaint: Tachycardia Symptoms:  Frequency: Yesterday a few times and this morning Pertinent Negatives: Patient denies any other s/s Disposition: [] ED /[] Urgent Care (no appt availability in office) / [x] Appointment(In office/virtual)/ []  [place] [facility]/ [] Home Care/ [] Refused Recommended Disposition /[] [place] [facility]/ []  Follow-up with PCP Additional Notes: Pt states that yesterday during her walk her HR went to 150.  Then later w/o exercise hr rate again wen to 140-150 and then resolved. It happened again this morning, pt currently has HR of 70-80. Appt made for this afternoon. Pt will cal back if rapid HR returns or she has any other s/s.     Reason for Disposition  [1] Heart beating very rapidly (e.g., > 140 / minute) AND [2] not present now  (Exception: During exercise.)  Answer Assessment - Initial Assessment Questions 1. DESCRIPTION: "Please describe your heart rate or heartbeat that you are having" (e.g., fast/slow, regular/irregular, skipped or extra beats, "palpitations")     Fast heart rate  - then goes down to 70-80 2. ONSET: "When did it start?" (Minutes, hours or days)      yesterday 3. DURATION: "How long does it last" (e.g., seconds, minutes, hours)     A few minutes to 1 hour. Then HR goes to normal 4. PATTERN "Does it come and go, or has it been constant since it started?"  "Does it get worse with exertion?"   "Are you feeling it now?"     Comes and goes 6. HEART RATE: "Can you tell me your heart rate?" "How many beats in 15 seconds?"  (Note: not all patients can do this)       150-160 7. RECURRENT SYMPTOM: "Have you ever had this before?" If Yes, ask: "When was the last time?" and "What happened that time?"      This monring 8. CAUSE: "What do you think is causing the palpitations?"     unsure 10. OTHER SYMPTOMS: "Do you have any other symptoms?" (e.g., dizziness, chest pain, sweating, difficulty breathing)       no  Protocols used: Heart  Rate and Heartbeat Questions-A-AH

## 2024-01-18 ENCOUNTER — Ambulatory Visit: Payer: Self-pay | Admitting: *Deleted

## 2024-01-18 NOTE — Telephone Encounter (Signed)
    Chief Complaint: Pt. Asking if she can wear her cardiac monitor while having teeth cleaned. Dental office told her it would not interfere.  Symptoms: n/a Frequency: n/a Pertinent Negatives: Patient denies  Disposition: [] ED /[] Urgent Care (no appt availability in office) / [] Appointment(In office/virtual)/ []  [place] [facility]/ [x] Home Care/ [] Refused Recommended Disposition /[]  Mobile Bus/ []  Follow-up with PCP Additional Notes: Instructed she can wear monitor without difficulty.  Reason for Disposition  Health Information question, no triage required and triager able to answer question  Answer Assessment - Initial Assessment Questions 1. REASON FOR CALL or QUESTION: "What is your reason for calling today?" or "How can I best help you?" or "What question do you have that I can help answer?"     Can I wear my heart monitor when I get my teeth cleaned.  Protocols used: Information Only Call - No Triage-A-AH

## 2024-01-18 NOTE — Telephone Encounter (Signed)
 Attempted to return her call.   Left a voicemail to call back.

## 2024-01-18 NOTE — Telephone Encounter (Signed)
Message from [place] S sent at 01/18/2024 10:26 AM EST  Summary: Heart monitor and teeth cleaning?   The patient called in wanting to speak with a nurse. She states she has a teeth cleaning on Wednesday and that she has a heart monitor on. She states she is not due to take it off until Thursday and she wants to know if for any reason if the teeth cleaning would interfere with wearing the heart monitor? She is worried being there are a lot of instruments and just wanted to make sure this would be ok. The dental office said it would be no problem but she would like peace of mind hearing from a nurse. Please assist patient further          Call History  Contact Date/Time Type Contact Phone/Fax By  01/18/2024 10:22 AM EST Phone (Incoming) Johnson, Kayla Skolnik (Self) [phone] Rexene Edison) Kandis Cocking

## 2024-01-31 DIAGNOSIS — R000 Tachycardia, unspecified: Secondary | ICD-10-CM | POA: Diagnosis not present

## 2024-02-01 ENCOUNTER — Telehealth: Payer: Self-pay

## 2024-02-01 NOTE — Telephone Encounter (Signed)
 Copied from CRM [id number]. Topic: Clinical - Lab/Test Results >> Feb 01, 2024 10:22 AM Maxwell Marion wrote: Reason for CRM: Patient called for results from heart monitor, says they are in her mychart but she doesn't understand them and would like a call back from the nurse. I do not see any results to relay to patient.

## 2024-02-01 NOTE — Telephone Encounter (Signed)
 Sending to ordering provider to provide direction of results and then will call patient back with further details.

## 2024-02-02 ENCOUNTER — Encounter: Payer: Self-pay | Admitting: Nurse Practitioner

## 2024-02-02 MED ORDER — METOPROLOL SUCCINATE ER 25 MG PO TB24: 25.0000 mg | ORAL_TABLET | Freq: Every day | ORAL | 0 refills | Status: DC

## 2024-02-02 NOTE — Addendum Note (Signed)
 Addended by: Larae Grooms on: 02/02/2024 12:51 PM   Modules accepted: Orders

## 2024-02-02 NOTE — Telephone Encounter (Signed)
Please see Result Note 

## 2024-02-09 ENCOUNTER — Telehealth: Payer: Self-pay | Admitting: Family Medicine

## 2024-02-09 NOTE — Telephone Encounter (Signed)
 Further discussed results to ensure her starting the medication is recommend by her PCP. She does however wonder if she can get in with a cardiologist sooner or if there is a female she could see. Advised her to do some research to see who she might be comfortable with seeing and then relay that information to [place] so we can then redirect her referral. She understands if she needs further information about the condition and does not feel she can wait for cardiology she will need to schedule visit with PCP to review. She understands and agrees.

## 2024-02-09 NOTE — Telephone Encounter (Signed)
 Copied from CRM [id number]. Topic: Clinical - Lab/Test Results >> Feb 09, 2024  1:44 PM Patsy Lager T wrote: Reason for CRM: patient is requesting a nurse call back to explain her monitoring results from where she wore a heart monitor

## 2024-04-22 ENCOUNTER — Ambulatory Visit: Admitting: Cardiology

## 2024-05-25 ENCOUNTER — Encounter: Payer: Self-pay | Admitting: Family Medicine

## 2024-05-30 ENCOUNTER — Telehealth: Payer: Self-pay | Admitting: *Deleted

## 2024-05-30 NOTE — Telephone Encounter (Signed)
 LMOVM to verify card hx.

## 2024-05-31 DIAGNOSIS — I471 Supraventricular tachycardia, unspecified: Secondary | ICD-10-CM | POA: Insufficient documentation

## 2024-05-31 NOTE — Progress Notes (Unsigned)
 Cardiology Office Note  Date:  06/01/2024   ID:  Kayla Johnson, DOB 1946/10/03, MRN [id number]  PCP:  Vicci Duwaine SQUIBB, DO   Chief Complaint  Patient presents with   New Patient (Initial Visit)    Ref by Duwaine Vicci, DO for SVT. Patient wore a Zio monitor in Feb. 2025. Patient c/o fluttering in chest at times, cramping in feet and legs occasion; not associated with walking feeling exhausted, tiredness and is very sleepy.     HPI:  [name] is a 78 year old woman with past medical history of Paroxysmal tachycardia Hyperlipidemia Aortic atherosclerosis Who presents by referral from Kayla Johnson for SVT  On discussion today, she reports earlier in the year she had some occasional palpitations She had a event monitor performed showing on average 8 short runs SVT daily States that she was relatively asymptomatic when she had the monitor on and since that time  She was prescribed metoprolol  succinate, she has not been taking this Prefers not to be on medications  Again states minimal palpitations or symptoms of tachycardia  Main complaint is extreme fatigue and exhaustion at times Reports that she sleeps well overnight, feels she gets enough hours of sleep, sometimes in the afternoon will have acute onset of severe exhaustion, has to lay down and sleep  Walks in the mall 1 mile, good activity tolerance  Event monitor January 27, 2024, pulled up and reviewed HR 60 to 197, average 82. 102 nonsustained SVT episodes, longest 12 seconds with an average rate of 153 bpm. Rare supraventricular and ventricular ectopy. No sustained arrhythmias. No atrial fibrillation.   EKG personally reviewed by myself on todays visit EKG Interpretation Date/Time:  Wednesday June 01 2024 09:43:47 EDT Ventricular Rate:  72 PR Interval:  154 QRS Duration:  74 QT Interval:  374 QTC Calculation: 409 R Axis:   60  Text Interpretation: Normal sinus rhythm Normal ECG When compared with ECG of  11-Dec-2020 16:22, No significant change was found Confirmed by Perla Lye [phone]) on 06/01/2024 9:50:45 AM    PMH:   has a past medical history of Carpal tunnel syndrome, COVID-19 (12/06/2020), GERD (gastroesophageal reflux disease), Lichen sclerosus, Pernicious anemia, and Shoulder bursitis.  PSH:    Past Surgical History:  Procedure Laterality Date   ABDOMINAL HYSTERECTOMY     ESOPHAGOGASTRODUODENOSCOPY (EGD) WITH PROPOFOL  N/A 09/12/2020   Procedure: ESOPHAGOGASTRODUODENOSCOPY (EGD) WITH PROPOFOL ;  Surgeon: Toledo, Ladell POUR, MD;  Location: [facility] ENDOSCOPY;  Service: Gastroenterology;  Laterality: N/A;   Molar Pregnancy     S/P D & C   TONSILLECTOMY     Tubes & Ovaries removed  2015    Current Outpatient Medications  Medication Sig Dispense Refill   cetirizine  (ZYRTEC ) 10 MG tablet Take 1 tablet (10 mg total) by mouth daily. 90 tablet 3   clobetasol  ointment (TEMOVATE ) 0.05 % 1 application 2 times a week 30 g 2   Cyanocobalamin  (VITAMIN B 12 PO) Take by mouth daily.      diclofenac  Sodium (VOLTAREN ) 1 % GEL Apply 2 g topically 4 (four) times daily. 50 g 1   fluticasone  (FLONASE ) 50 MCG/ACT nasal spray Place 2 sprays into both nostrils daily. 16 g 2   Multiple Vitamins-Minerals (CENTRUM SILVER 50+WOMEN PO) Take by mouth.     ondansetron  (ZOFRAN  ODT) 4 MG disintegrating tablet Take 1 tablet (4 mg total) by mouth every 8 (eight) hours as needed. 20 tablet 1   Simethicone  (GAS-X PO) Take by mouth.  tiZANidine  (ZANAFLEX ) 4 MG tablet Take 1 tablet (4 mg total) by mouth every 8 (eight) hours as needed. 30 tablet 1   vitamin E 180 MG (400 UNITS) capsule Take 400 Units by mouth daily.     metoprolol  succinate (TOPROL -XL) 25 MG 24 hr tablet Take 1 tablet (25 mg total) by mouth daily as needed.     omeprazole  (PRILOSEC) 20 MG capsule Take 20 mg by mouth daily. (Patient not taking: Reported on 06/01/2024)     No current facility-administered medications for this visit.     Allergies:    Lidocaine    Social History:  The patient  reports that she has never smoked. She has never used smokeless tobacco. She reports that she does not drink alcohol and does not use drugs.   Family History:   family history includes Arthritis in her father and mother; Diabetes in her mother, sister, and sister; Ovarian cancer in her sister; Stroke in her sister.    Review of Systems: Review of Systems  Constitutional: Negative.   HENT: Negative.    Respiratory: Negative.    Cardiovascular: Negative.   Gastrointestinal: Negative.   Musculoskeletal: Negative.   Neurological: Negative.   Psychiatric/Behavioral: Negative.    All other systems reviewed and are negative.   PHYSICAL EXAM: VS:  BP (!) 140/60 (BP Location: Right Arm, Patient Position: Sitting, Cuff Size: Normal)   Pulse 72   Ht 5' 3 (1.6 m)   Wt 108 lb 6 oz (49.2 kg)   SpO2 97%   BMI 19.20 kg/m  , BMI Body mass index is 19.2 kg/m. GEN: Well nourished, well developed, in no acute distress HEENT: normal Neck: no JVD, carotid bruits, or masses Cardiac: RRR; no murmurs, rubs, or gallops,no edema  Respiratory:  clear to auscultation bilaterally, normal work of breathing GI: soft, nontender, nondistended, + BS MS: no deformity or atrophy Skin: warm and dry, no rash Neuro:  Strength and sensation are intact Psych: euthymic mood, full affect  Recent Labs: No results found for requested labs within last 365 days.    Lipid Panel Lab Results  Component Value Date   CHOL 205 (H) 05/20/2023   HDL 61 05/20/2023   LDLCALC 133 (H) 05/20/2023   TRIG 61 05/20/2023      Wt Readings from Last 3 Encounters:  06/01/24 108 lb 6 oz (49.2 kg)  01/04/24 110 lb 6.4 oz (50.1 kg)  05/20/23 109 lb 6.4 oz (49.6 kg)       ASSESSMENT AND PLAN:  Problem List Items Addressed This Visit       Cardiology Problems   SVT (supraventricular tachycardia) (HCC) - Primary   Relevant Medications   metoprolol  succinate (TOPROL -XL) 25 MG 24  hr tablet   Other Relevant Orders   EKG 12-Lead (Completed)   Hypercholesteremia   Relevant Medications   metoprolol  succinate (TOPROL -XL) 25 MG 24 hr tablet   Aortic atherosclerosis (HCC)   Relevant Medications   metoprolol  succinate (TOPROL -XL) 25 MG 24 hr tablet   Other Relevant Orders   EKG 12-Lead (Completed)   SVT Rare episodes on average 8/day, lasting several seconds at a time, asymptomatic She prefers not to be on medications at this time Recommend she take the metoprolol  succinate as needed for days when she has symptomatic palpitations -Echocardiogram ordered as below  Extreme fatigue/exhaustion Etiology unclear, lab work from 2024 essentially normal including B12 Recommend she try not to miss meals, make sure her sugar is not dropping low She could try small  amount of coffee, small candy bar for her extreme exhaustion, in case sugars running low Previously thyroid  numbers well-controlled -She does exercise program, walks up to a mile in the mall with no symptoms, less likely angina - Echocardiogram ordered to rule out structural heart disease  Aortic atherosclerosis Very mild aortic atherosclerosis seen on CT scan, 1/22 images pulled up and reviewed  Cholesterol of 200, above goal Ideally should be less than 150 She  has indicated she prefers not to be on medications  Signed, Velinda Lunger, M.D., Ph.D. [facility] [place], [place] [phone]

## 2024-06-01 ENCOUNTER — Ambulatory Visit: Attending: Cardiovascular Disease | Admitting: Cardiovascular Disease

## 2024-06-01 ENCOUNTER — Encounter: Payer: Self-pay | Admitting: Cardiovascular Disease

## 2024-06-01 VITALS — BP 140/60 | HR 72 | Ht 63.0 in | Wt 108.4 lb

## 2024-06-01 DIAGNOSIS — R5383 Other fatigue: Secondary | ICD-10-CM

## 2024-06-01 DIAGNOSIS — I700 Atherosclerosis of aorta: Secondary | ICD-10-CM

## 2024-06-01 DIAGNOSIS — R002 Palpitations: Secondary | ICD-10-CM

## 2024-06-01 DIAGNOSIS — I471 Supraventricular tachycardia, unspecified: Secondary | ICD-10-CM | POA: Diagnosis not present

## 2024-06-01 DIAGNOSIS — E7800 Pure hypercholesterolemia, unspecified: Secondary | ICD-10-CM

## 2024-06-01 NOTE — Patient Instructions (Addendum)

## 2024-06-20 ENCOUNTER — Encounter: Payer: Self-pay | Admitting: Family Medicine

## 2024-06-20 ENCOUNTER — Ambulatory Visit: Attending: Family Medicine

## 2024-06-20 ENCOUNTER — Ambulatory Visit: Admitting: Family Medicine

## 2024-06-20 VITALS — BP 132/74 | HR 81 | Temp 97.9°F | Ht 62.0 in | Wt 108.4 lb

## 2024-06-20 DIAGNOSIS — E559 Vitamin D deficiency, unspecified: Secondary | ICD-10-CM | POA: Diagnosis not present

## 2024-06-20 DIAGNOSIS — R5383 Other fatigue: Secondary | ICD-10-CM

## 2024-06-20 DIAGNOSIS — R55 Syncope and collapse: Secondary | ICD-10-CM

## 2024-06-20 DIAGNOSIS — D510 Vitamin B12 deficiency anemia due to intrinsic factor deficiency: Secondary | ICD-10-CM | POA: Diagnosis not present

## 2024-06-20 DIAGNOSIS — D692 Other nonthrombocytopenic purpura: Secondary | ICD-10-CM

## 2024-06-20 DIAGNOSIS — R7301 Impaired fasting glucose: Secondary | ICD-10-CM | POA: Diagnosis not present

## 2024-06-20 DIAGNOSIS — Z0000 Encounter for general adult medical examination without abnormal findings: Secondary | ICD-10-CM

## 2024-06-20 DIAGNOSIS — I700 Atherosclerosis of aorta: Secondary | ICD-10-CM

## 2024-06-20 DIAGNOSIS — E7800 Pure hypercholesterolemia, unspecified: Secondary | ICD-10-CM | POA: Diagnosis not present

## 2024-06-20 DIAGNOSIS — R32 Unspecified urinary incontinence: Secondary | ICD-10-CM | POA: Diagnosis not present

## 2024-06-20 LAB — BAYER DCA HB A1C WAIVED: HB A1C (BAYER DCA - WAIVED): 6 % — ABNORMAL HIGH (ref 4.8–5.6)

## 2024-06-20 MED ORDER — ONDANSETRON 4 MG PO TBDP: 4.0000 mg | ORAL_TABLET | Freq: Three times a day (TID) | ORAL | 1 refills | Status: AC | PRN

## 2024-06-20 MED ORDER — CLOBETASOL PROPIONATE 0.05 % EX OINT: TOPICAL_OINTMENT | CUTANEOUS | 2 refills | Status: AC

## 2024-06-20 MED ORDER — TIZANIDINE HCL 4 MG PO TABS: 4.0000 mg | ORAL_TABLET | Freq: Three times a day (TID) | ORAL | 1 refills | Status: AC | PRN

## 2024-06-20 MED ORDER — FLUTICASONE PROPIONATE 50 MCG/ACT NA SUSP: 2.0000 | Freq: Every day | NASAL | 2 refills | Status: AC

## 2024-06-20 NOTE — Progress Notes (Unsigned)
 Subjective:   Kayla Johnson is a 78 y.o. female who presents for Medicare Annual (Subsequent) preventive examination.  Visit Complete: In person  Patient Medicare AWV questionnaire was completed by the patient on 06/20/24; I have confirmed that all information answered by patient is correct and no changes since this date.  Cardiac Risk Factors include: advanced age (>73men, >1 women)     Objective:    Today's Vitals   06/20/24 0930 06/20/24 0933  BP: 132/74   Pulse: 81   Temp: 97.9 F (36.6 C)   TempSrc: Oral   SpO2: 92%   Weight: 108 lb 6.4 oz (49.2 kg)   Height: 5' 2 (1.575 m)   PainSc: 0-No pain 0-No pain   Body mass index is 19.83 kg/m.     06/20/2024    9:53 AM 11/13/2021   10:06 AM 12/11/2020    8:24 PM 12/11/2020    4:19 PM 09/12/2020   10:51 AM 05/24/2020   11:21 AM 01/27/2020    9:32 PM  Advanced Directives  Does Patient Have a Medical Advance Directive? No Yes Yes No No No No  Type of Special educational needs teacher of [place];Living will Living will      Does patient want to make changes to medical advance directive?  No - Patient declined No - Patient declined      Copy of Healthcare Power of Attorney in Chart?  No - copy requested       Would patient like information on creating a medical advance directive? Yes (Inpatient - patient defers creating a medical advance directive at this time - Information given)    No - Patient declined No - Patient declined No - Patient declined    Current Medications (verified) Outpatient Encounter Medications as of 06/20/2024  Medication Sig   cetirizine  (ZYRTEC ) 10 MG tablet Take 1 tablet (10 mg total) by mouth daily.   Cyanocobalamin  (VITAMIN B 12 PO) Take by mouth daily.    diclofenac  Sodium (VOLTAREN ) 1 % GEL Apply 2 g topically 4 (four) times daily.   metoprolol  succinate (TOPROL -XL) 25 MG 24 hr tablet Take 1 tablet (25 mg total) by mouth daily as needed.   Multiple Vitamins-Minerals (CENTRUM SILVER 50+WOMEN PO)  Take by mouth.   Simethicone  (GAS-X PO) Take by mouth.   vitamin E 180 MG (400 UNITS) capsule Take 400 Units by mouth daily.   [DISCONTINUED] clobetasol  ointment (TEMOVATE ) 0.05 % 1 application 2 times a week   [DISCONTINUED] fluticasone  (FLONASE ) 50 MCG/ACT nasal spray Place 2 sprays into both nostrils daily.   [DISCONTINUED] ondansetron  (ZOFRAN  ODT) 4 MG disintegrating tablet Take 1 tablet (4 mg total) by mouth every 8 (eight) hours as needed.   [DISCONTINUED] tiZANidine  (ZANAFLEX ) 4 MG tablet Take 1 tablet (4 mg total) by mouth every 8 (eight) hours as needed.   clobetasol  ointment (TEMOVATE ) 0.05 % 1 application 2 times a week   fluticasone  (FLONASE ) 50 MCG/ACT nasal spray Place 2 sprays into both nostrils daily.   omeprazole  (PRILOSEC) 20 MG capsule Take 20 mg by mouth daily. (Patient not taking: Reported on 06/20/2024)   ondansetron  (ZOFRAN  ODT) 4 MG disintegrating tablet Take 1 tablet (4 mg total) by mouth every 8 (eight) hours as needed.   tiZANidine  (ZANAFLEX ) 4 MG tablet Take 1 tablet (4 mg total) by mouth every 8 (eight) hours as needed.   No facility-administered encounter medications on file as of 06/20/2024.    Allergies (verified) Lidocaine    History: Past Medical  History:  Diagnosis Date   Carpal tunnel syndrome    COVID-19 12/06/2020   reported by patient   GERD (gastroesophageal reflux disease)    Lichen sclerosus    Pernicious anemia    Shoulder bursitis    Past Surgical History:  Procedure Laterality Date   ABDOMINAL HYSTERECTOMY     ESOPHAGOGASTRODUODENOSCOPY (EGD) WITH PROPOFOL  N/A 09/12/2020   Procedure: ESOPHAGOGASTRODUODENOSCOPY (EGD) WITH PROPOFOL ;  Surgeon: Toledo, Ladell POUR, MD;  Location: [facility] ENDOSCOPY;  Service: Gastroenterology;  Laterality: N/A;   Molar Pregnancy     S/P D & C   TONSILLECTOMY     Tubes & Ovaries removed  2015   Family History  Problem Relation Age of Onset   Arthritis Mother    Diabetes Mother    Arthritis Father    Diabetes  Sister    Stroke Sister    Diabetes Sister    Ovarian cancer Sister    Social History   Socioeconomic History   Marital status: Widowed    Spouse name: Not on file   Number of children: Not on file   Years of education: Not on file   Highest education level: Not on file  Occupational History   Not on file  Tobacco Use   Smoking status: Never   Smokeless tobacco: Never  Vaping Use   Vaping status: Never Used  Substance and Sexual Activity   Alcohol use: No    Alcohol/week: 0.0 standard drinks of alcohol   Drug use: No   Sexual activity: Not Currently    Birth control/protection: Post-menopausal  Other Topics Concern   Not on file  Social History Narrative   Works 32 hours a week at [employer]: Low Risk  (06/20/2024)   Overall Financial Resource Strain (CARDIA)    Difficulty of Paying Living Expenses: Not hard at all  Food Insecurity: No Food Insecurity (06/20/2024)   Hunger Vital Sign    Worried About Running Out of Food in the Last Year: Never true    Ran Out of Food in the Last Year: Never true  Transportation Needs: No Transportation Needs (06/20/2024)   PRAPARE - Administrator, Civil Service (Medical): No    Lack of Transportation (Non-Medical): No  Physical Activity: Inactive (06/20/2024)   Exercise Vital Sign    Days of Exercise per Week: 0 days    Minutes of Exercise per Session: 0 min  Stress: No Stress Concern Present (06/20/2024)   [employer] of Occupational Health - Occupational Stress Questionnaire    Feeling of Stress: Not at all  Social Connections: Socially Integrated (06/20/2024)   Social Connection and Isolation Panel    Frequency of Communication with Friends and Family: More than three times a week    Frequency of Social Gatherings with Friends and Family: Not on file    Attends Religious Services: More than 4 times per year    Active Member of [employer] or Organizations: Yes     Attends Engineer, structural: More than 4 times per year    Marital Status: Married    Tobacco Counseling Counseling given: Not Answered   Clinical Intake:     Pain : No/denies pain Pain Score: 0-No pain     Nutritional Risks: None Diabetes: No  How often do you need to have someone help you when you read instructions, pamphlets, or other written materials from your doctor or pharmacy?: 1 - Never  Interpreter Needed?: No      Activities of Daily Living    06/20/2024    9:25 AM  In your present state of health, do you have any difficulty performing the following activities:  Hearing? 0  Vision? 0  Difficulty concentrating or making decisions? 0  Walking or climbing stairs? 0  Dressing or bathing? 0  Doing errands, shopping? 0  Preparing Food and eating ? N  Using the Toilet? N  In the past six months, have you accidently leaked urine? N  Do you have problems with loss of bowel control? N  Managing your Medications? N  Managing your Finances? N  Housekeeping or managing your Housekeeping? N    Patient Care Team: Vicci Duwaine SQUIBB, DO as PCP - General (Family Medicine) Portia Fireman, OD (Optometry)  Indicate any recent Medical Services you may have received from other than [facility] providers in the past year (date may be approximate).     Assessment:   This is a routine wellness examination for Kayla Johnson.  Hearing/Vision screen No results found.   Goals Addressed   None    Depression Screen    06/20/2024    9:27 AM 01/04/2024    2:00 PM 05/21/2023    1:06 PM 11/19/2022    9:27 AM 08/28/2022    4:05 PM 03/05/2022   10:51 AM 10/23/2021   11:38 AM  PHQ 2/9 Scores  PHQ - 2 Score 0 0 0 0 0 0 0  PHQ- 9 Score 0 0 0 0 1 0 0    Fall Risk    06/20/2024    9:33 AM 11/19/2022    9:27 AM 08/28/2022    4:05 PM 03/05/2022   10:51 AM 10/23/2021   11:38 AM  Fall Risk   Falls in the past year? 0 0 0 0 0  Number falls in past yr: 0 0 0 0 0  Injury with Fall?  0 0 0 0 0  Risk for fall due to :  No Fall Risks No Fall Risks No Fall Risks No Fall Risks  Follow up  Falls evaluation completed  Falls evaluation completed  Falls evaluation completed  Falls evaluation completed      Data saved with a previous flowsheet row definition    MEDICARE RISK AT HOME: Medicare Risk at Home Any stairs in or around the home?: No If so, are there any without handrails?: No Home free of loose throw rugs in walkways, pet beds, electrical cords, etc?: No Adequate lighting in your home to reduce risk of falls?: Yes Life alert?: No Use of a cane, walker or w/c?: No Grab bars in the bathroom?: No Shower chair or bench in shower?: No Elevated toilet seat or a handicapped toilet?: No  TIMED UP AND GO:  Was the test performed?  Yes  Length of time to ambulate 10 feet: 10 sec Gait steady and fast without use of assistive device    Cognitive Function:        06/20/2024    9:26 AM 04/14/2018    1:51 PM  6CIT Screen  What Year? 0 points 0 points  What month? 0 points 0 points  What time? 0 points 0 points  Count back from 20 0 points 0 points  Months in reverse 0 points 0 points  Repeat phrase 0 points 0 points  Total Score 0 points 0 points    Immunizations Immunization History  Administered Date(s) Administered   PNEUMOCOCCAL CONJUGATE-20 05/14/2022   Pneumococcal  Conjugate-13 12/12/2015    TDAP status: declined  Flu shot in the fall  Pneumococcal vaccine status: Up to date  Covid-19 vaccine status: Declined, Education has been provided regarding the importance of this vaccine but patient still declined. Advised may receive this vaccine at local pharmacy or Health Dept.or vaccine clinic. Aware to provide a copy of the vaccination record if obtained from local pharmacy or Health Dept. Verbalized acceptance and understanding.  Qualifies for Shingles Vaccine? Yes   Zostavax completed No   Shingrix Completed?: No.    Education has been provided  regarding the importance of this vaccine. Patient has been advised to call insurance company to determine out of pocket expense if they have not yet received this vaccine. Advised may also receive vaccine at local pharmacy or Health Dept. Verbalized acceptance and understanding.  Screening Tests Health Maintenance  Topic Date Due   Zoster Vaccines- Shingrix (1 of 2) Never done   DEXA SCAN  01/18/2020   DTaP/Tdap/Td (1 - Tdap) 06/20/2025 (Originally 05/25/1965)   INFLUENZA VACCINE  07/01/2024   Medicare Annual Wellness (AWV)  06/20/2025   Pneumococcal Vaccine: 50+ Years  Completed   Hepatitis B Vaccines  Aged Out   HPV VACCINES  Aged Out   Meningococcal B Vaccine  Aged Out   COVID-19 Vaccine  Discontinued   Hepatitis C Screening  Discontinued   Fecal DNA (Cologuard)  Discontinued    Health Maintenance  Health Maintenance Due  Topic Date Due   Zoster Vaccines- Shingrix (1 of 2) Never done   DEXA SCAN  01/18/2020    Colorectal cancer screening: No longer required.   Mammogram status: No longer required due to age.  Bone Density status: Completed 01/17/15. Results reflect: Bone density results: OSTEOPENIA. Repeat every 5 years.  Lung Cancer Screening: (Low Dose CT Chest recommended if Age 70-80 years, 20 pack-year currently smoking OR have quit w/in 15years.) does not qualify.    Additional Screening:  Hepatitis C Screening: does qualify; Completed 08/21/20  Dental Screening: Recommended annual dental exams for proper oral hygiene  Community Resource Referral / Chronic Care Management: CRR required this visit?  No   CCM required this visit?  No     Plan:     I have personally reviewed and noted the following in the patient's chart:   Medical and social history Use of alcohol, tobacco or illicit drugs  Current medications and supplements including opioid prescriptions. Patient is currently taking opioid prescriptions. Information provided to patient regarding  non-opioid alternatives. Patient advised to discuss non-opioid treatment plan with their provider. Functional ability and status Nutritional status Physical activity Advanced directives List of other physicians Hospitalizations, surgeries, and ER visits in previous 12 months Vitals Screenings to include cognitive, depression, and falls Referrals and appointments  In addition, I have reviewed and discussed with patient certain preventive protocols, quality metrics, and best practice recommendations. A written personalized care plan for preventive services as well as general preventive health recommendations were provided to patient.     Duwaine Louder, DO   06/23/2024   After Visit Summary: (In Person-Printed) AVS printed and given to the patient

## 2024-06-20 NOTE — Progress Notes (Unsigned)
 BP 132/74   Pulse 81   Temp 97.9 F (36.6 C) (Oral)   Ht 5' 2 (1.575 m)   Wt 108 lb 6.4 oz (49.2 kg)   SpO2 92%   BMI 19.83 kg/m    Subjective:    Patient ID: Kayla Johnson, female    DOB: [birth year], 78 y.o.   MRN: [id number]  HPI: Kayla Johnson is a 78 y.o. female presenting on 06/20/2024 for comprehensive medical examination. Current medical complaints include:  UPPER RESPIRATORY TRACT INFECTION Duration: 1-2 days Worst symptom: nasal congestion Fever: no Cough: no Shortness of breath: no Wheezing: no Chest pain: no Chest tightness: no Chest congestion: no Nasal congestion: yes Runny nose: no Post nasal drip: yes Sneezing: no Sore throat: no Swollen glands: no Sinus pressure: yes Headache: yes Face pain: yes Toothache: no Ear pain: no  Ear pressure: no  Eyes red/itching:no Eye drainage/crusting: no  Vomiting: no Rash: no Fatigue: yes Sick contacts: no Strep contacts: no  Context: worse Recurrent sinusitis: no Relief with OTC cold/cough medications: no  Treatments attempted: none   FATIGUE Duration:  {Blank single:19197::chronic,days,weeks,months} Severity: {Blank single:19197::mild,moderate,severe,1/10,2/10,3/10,4/10,5/10,6/10,7/10,8/10,9/10,10/10}  Onset: {Blank single:19197::sudden,gradual} Context when symptoms started:  {Blank single:19197::none,unknown} Symptoms improve with rest: {Blank single:19197::yes,no}  Depressive symptoms: {Blank single:19197::yes,no} Stress/anxiety: {Blank single:19197::yes,no} Insomnia: {Blank single:19197::yes,no} {Blank single:19197::hard to fall asleep,hard to stay asleep} Snoring: {Blank single:19197::yes,no} Observed apnea by bed partner: {Blank single:19197::yes,no} Daytime hypersomnolence:{Blank single:19197::yes,no} Wakes feeling refreshed: {Blank single:19197::yes,no} History of sleep study: {Blank single:19197::yes,no} Dysnea  on exertion:  {Blank single:19197::yes,no} Orthopnea/PND: {Blank single:19197::yes,no} Chest pain: {Blank single:19197::yes,no} Chronic cough: {Blank single:19197::yes,no} Lower extremity edema: {Blank single:19197::yes,no} Arthralgias:{Blank single:19197::yes,no} Myalgias: {Blank single:19197::yes,no} Weakness: {Blank single:19197::yes,no} Rash: {Blank single:19197::yes,no}  HYPERLIPIDEMIA Hyperlipidemia status: {Blank single:19197::excellent compliance,good compliance,fair compliance,poor compliance} Satisfied with current treatment?  {Blank single:19197::yes,no} Side effects:  {Blank single:19197::yes,no} Medication compliance: {Blank single:19197::excellent compliance,good compliance,fair compliance,poor compliance} Past cholesterol meds: {Blank multiple:19196::none,atorvastain (lipitor),lovastatin (mevacor),pravastatin (pravachol),rosuvastatin (crestor),simvastatin (zocor),vytorin,fenofibrate (tricor),gemfibrozil,ezetimide (zetia),niaspan,lovaza} Supplements: {Blank multiple:19196::none,fish oil,niacin,red yeast rice} Aspirin:  {Blank single:19197::yes,no} The ASCVD Risk score (Arnett DK, et al., 2019) failed to calculate for the following reasons:   The 2019 ASCVD risk score is only valid for ages 14 to 97 Chest pain:  {Blank single:19197::yes,no} Coronary artery disease:  {Blank single:19197::yes,no} Family history CAD:  {Blank single:19197::yes,no} Family history early CAD:  {Blank single:19197::yes,no}  Menopausal Symptoms: {Blank single:19197::yes,no}  Functional Status Survey: Is the patient deaf or have difficulty hearing?: No Does the patient have difficulty seeing, even when wearing glasses/contacts?: No Does the patient have difficulty concentrating, remembering, or making decisions?: No Does the patient have difficulty walking or climbing stairs?: No Does  the patient have difficulty dressing or bathing?: No Does the patient have difficulty doing errands alone such as visiting a doctor's office or shopping?: No     06/20/2024    9:33 AM 11/19/2022    9:27 AM 08/28/2022    4:05 PM 03/05/2022   10:51 AM 10/23/2021   11:38 AM  Fall Risk   Falls in the past year? 0 0 0 0 0  Number falls in past yr: 0 0 0 0 0  Injury with Fall? 0 0 0 0 0  Risk for fall due to :  No Fall Risks No Fall Risks No Fall Risks No Fall Risks  Follow up  Falls evaluation completed  Falls evaluation completed  Falls evaluation completed  Falls evaluation completed      Data saved with a previous flowsheet row definition    Depression Screen  06/20/2024    9:27 AM 01/04/2024    2:00 PM 05/21/2023    1:06 PM 11/19/2022    9:27 AM 08/28/2022    4:05 PM  Depression screen PHQ 2/9  Decreased Interest 0 0 0 0 0  Down, Depressed, Hopeless 0 0 0 0 0  PHQ - 2 Score 0 0 0 0 0  Altered sleeping 0 0 0 0 0  Tired, decreased energy 0 0 0 0 1  Change in appetite 0 0 0 0 0  Feeling bad or failure about yourself  0 0 0 0 0  Trouble concentrating 0 0 0 0 0  Moving slowly or fidgety/restless 0 0 0 0 0  Suicidal thoughts 0 0 0 0 0  PHQ-9 Score 0 0 0 0 1  Difficult doing work/chores   Not difficult at all Not difficult at all Not difficult at all    Advanced Directives Does patient have a HCPOA?    no If yes, name and contact information:  Does patient have a living will or MOST form?  no  Past Medical History:  Past Medical History:  Diagnosis Date   Carpal tunnel syndrome    COVID-19 12/06/2020   reported by patient   GERD (gastroesophageal reflux disease)    Lichen sclerosus    Pernicious anemia    Shoulder bursitis     Surgical History:  Past Surgical History:  Procedure Laterality Date   ABDOMINAL HYSTERECTOMY     ESOPHAGOGASTRODUODENOSCOPY (EGD) WITH PROPOFOL  N/A 09/12/2020   Procedure: ESOPHAGOGASTRODUODENOSCOPY (EGD) WITH PROPOFOL ;  Surgeon: Toledo,  Ladell POUR, MD;  Location: [facility] ENDOSCOPY;  Service: Gastroenterology;  Laterality: N/A;   Molar Pregnancy     S/P D & C   TONSILLECTOMY     Tubes & Ovaries removed  2015    Medications:  Current Outpatient Medications on File Prior to Visit  Medication Sig   cetirizine  (ZYRTEC ) 10 MG tablet Take 1 tablet (10 mg total) by mouth daily.   Cyanocobalamin  (VITAMIN B 12 PO) Take by mouth daily.    diclofenac  Sodium (VOLTAREN ) 1 % GEL Apply 2 g topically 4 (four) times daily.   metoprolol  succinate (TOPROL -XL) 25 MG 24 hr tablet Take 1 tablet (25 mg total) by mouth daily as needed.   Multiple Vitamins-Minerals (CENTRUM SILVER 50+WOMEN PO) Take by mouth.   Simethicone  (GAS-X PO) Take by mouth.   vitamin E 180 MG (400 UNITS) capsule Take 400 Units by mouth daily.   omeprazole  (PRILOSEC) 20 MG capsule Take 20 mg by mouth daily. (Patient not taking: Reported on 06/20/2024)   No current facility-administered medications on file prior to visit.    Allergies:  Allergies  Allergen Reactions   Lidocaine  Tinitus    Social History:  Social History   Socioeconomic History   Marital status: Widowed    Spouse name: Not on file   Number of children: Not on file   Years of education: Not on file   Highest education level: Not on file  Occupational History   Not on file  Tobacco Use   Smoking status: Never   Smokeless tobacco: Never  Vaping Use   Vaping status: Never Used  Substance and Sexual Activity   Alcohol use: No    Alcohol/week: 0.0 standard drinks of alcohol   Drug use: No   Sexual activity: Not Currently    Birth control/protection: Post-menopausal  Other Topics Concern   Not on file  Social History Narrative   Works 32 hours  a week at [employer]    Social Drivers of Health   Financial Resource Strain: Low Risk  (06/20/2024)   Overall Financial Resource Strain (CARDIA)    Difficulty of Paying Living Expenses: Not hard at all  Food Insecurity: No Food Insecurity (06/20/2024)    Hunger Vital Sign    Worried About Running Out of Food in the Last Year: Never true    Ran Out of Food in the Last Year: Never true  Transportation Needs: No Transportation Needs (06/20/2024)   PRAPARE - Administrator, Civil Service (Medical): No    Lack of Transportation (Non-Medical): No  Physical Activity: Inactive (06/20/2024)   Exercise Vital Sign    Days of Exercise per Week: 0 days    Minutes of Exercise per Session: 0 min  Stress: No Stress Concern Present (06/20/2024)   [employer] of Occupational Health - Occupational Stress Questionnaire    Feeling of Stress: Not at all  Social Connections: Socially Integrated (06/20/2024)   Social Connection and Isolation Panel    Frequency of Communication with Friends and Family: More than three times a week    Frequency of Social Gatherings with Friends and Family: Not on file    Attends Religious Services: More than 4 times per year    Active Member of [employer] or Organizations: Yes    Attends Banker Meetings: More than 4 times per year    Marital Status: Married  Catering manager Violence: Not At Risk (06/20/2024)   Humiliation, Afraid, Rape, and Kick questionnaire    Fear of Current or Ex-Partner: No    Emotionally Abused: No    Physically Abused: No    Sexually Abused: No   Social History   Tobacco Use  Smoking Status Never  Smokeless Tobacco Never   Social History   Substance and Sexual Activity  Alcohol Use No   Alcohol/week: 0.0 standard drinks of alcohol    Family History:  Family History  Problem Relation Age of Onset   Arthritis Mother    Diabetes Mother    Arthritis Father    Diabetes Sister    Stroke Sister    Diabetes Sister    Ovarian cancer Sister     Past medical history, surgical history, medications, allergies, family history and social history reviewed with patient today and changes made to appropriate areas of the chart.   Review of Systems  Constitutional:  Positive  for malaise/fatigue. Negative for chills, diaphoresis, fever and weight loss.  HENT:  Positive for congestion. Negative for ear discharge, ear pain, hearing loss, nosebleeds, sinus pain, sore throat and tinnitus.   Eyes:  Positive for blurred vision. Negative for double vision, photophobia, pain, discharge and redness.  Respiratory:  Positive for cough. Negative for hemoptysis, sputum production, shortness of breath, wheezing and stridor.   Cardiovascular: Negative.   Gastrointestinal:  Positive for heartburn. Negative for abdominal pain, blood in stool, constipation, diarrhea, melena, nausea and vomiting.  Genitourinary: Negative.        + small amount of incontinence  Musculoskeletal: Negative.   Skin: Negative.   Neurological: Negative.   Endo/Heme/Allergies:  Negative for environmental allergies and polydipsia. Bruises/bleeds easily.  Psychiatric/Behavioral: Negative.      All other ROS negative except what is listed above and in the HPI.      Objective:    BP 132/74   Pulse 81   Temp 97.9 F (36.6 C) (Oral)   Ht 5' 2 (1.575 m)   Wt  108 lb 6.4 oz (49.2 kg)   SpO2 92%   BMI 19.83 kg/m   Wt Readings from Last 3 Encounters:  06/20/24 108 lb 6.4 oz (49.2 kg)  06/01/24 108 lb 6 oz (49.2 kg)  01/04/24 110 lb 6.4 oz (50.1 kg)     Physical Exam     06/20/2024    9:26 AM 04/14/2018    1:51 PM  6CIT Screen  What Year? 0 points 0 points  What month? 0 points 0 points  What time? 0 points 0 points  Count back from 20 0 points 0 points  Months in reverse 0 points 0 points  Repeat phrase 0 points 0 points  Total Score 0 points 0 points   Results for orders placed or performed in visit on 06/20/24  HM HEPATITIS C SCREENING LAB   Collection Time: 08/21/20 12:00 AM  Result Value Ref Range   HM Hepatitis Screen Negative-Validated       Assessment & Plan:   Problem List Items Addressed This Visit       Cardiovascular and Mediastinum   Senile purpura (HCC)   Relevant  Orders   CBC with Differential/Platelet   Comprehensive metabolic panel with GFR   Aortic atherosclerosis (HCC)   Relevant Orders   Comprehensive metabolic panel with GFR   TSH     Other   Pernicious anemia - Primary   Relevant Orders   Comprehensive metabolic panel with GFR   B12   Vitamin D  deficiency   Relevant Orders   Comprehensive metabolic panel with GFR   VITAMIN D  25 Hydroxy (Vit-D Deficiency, Fractures)   Hypercholesteremia   Relevant Orders   Comprehensive metabolic panel with GFR   Lipid Panel w/o Chol/HDL Ratio   TSH   Other Visit Diagnoses       Syncope, unspecified syncope type       Relevant Orders   EKG 12-Lead   Bayer DCA Hb A1c Waived   Comprehensive metabolic panel with GFR   TSH   LONG TERM MONITOR XT (3-14 DAYS)     Urinary incontinence, unspecified type       Relevant Orders   Bayer DCA Hb A1c Waived   Comprehensive metabolic panel with GFR   Ambulatory referral to Physical Therapy        Preventative Services:  AAA screening: N/A Health Risk Assessment and Personalized Prevention Plan: Done today Bone Mass Measurements: Ordered today Breast Cancer Screening: N/A CVD Screening: Done today Cervical Cancer Screening: N/A Colon Cancer Screening: N/A Depression Screening: Done today Diabetes Screening: Done today Glaucoma Screening: see your eye doctor Hepatitis B vaccine: N/A Hepatitis C screening: Up to date HIV Screening: N/A Flu Vaccine: get in the fall Lung cancer Screening: N/A Obesity Screening: done today Pneumonia Vaccines (2): up to date STI Screening: N/A  Follow up plan: Return in about 5 weeks (around 07/25/2024).   LABORATORY TESTING:  - Pap smear: not applicable  IMMUNIZATIONS:   - Tdap: Tetanus vaccination status reviewed: Declined. - Influenza: Postponed to flu season - Pneumovax: Up to date - Prevnar: Up to date - Zostavax vaccine: Refused  SCREENING: -Mammogram: Not applicable  - Colonoscopy: Not  applicable  - Bone Density: Ordered today   PATIENT COUNSELING:   Advised to take 1 mg of folate supplement per day if capable of pregnancy.   Sexuality: Discussed sexually transmitted diseases, partner selection, use of condoms, avoidance of unintended pregnancy  and contraceptive alternatives.   Advised to avoid cigarette smoking.  I discussed with the patient that most people either abstain from alcohol or drink within safe limits (<=14/week and <=4 drinks/occasion for males, <=7/weeks and <= 3 drinks/occasion for females) and that the risk for alcohol disorders and other health effects rises proportionally with the number of drinks per week and how often a drinker exceeds daily limits.  Discussed cessation/primary prevention of drug use and availability of treatment for abuse.   Diet: Encouraged to adjust caloric intake to maintain  or achieve ideal body weight, to reduce intake of dietary saturated fat and total fat, to limit sodium intake by avoiding high sodium foods and not adding table salt, and to maintain adequate dietary potassium and calcium  preferably from fresh fruits, vegetables, and low-fat dairy products.    stressed the importance of regular exercise  Injury prevention: Discussed safety belts, safety helmets, smoke detector, smoking near bedding or upholstery.   Dental health: Discussed importance of regular tooth brushing, flossing, and dental visits.    NEXT PREVENTATIVE PHYSICAL DUE IN 1 YEAR. Return in about 5 weeks (around 07/25/2024).

## 2024-06-20 NOTE — Patient Instructions (Signed)
 Preventative Services:  AAA screening: N/A Health Risk Assessment and Personalized Prevention Plan: Done today Bone Mass Measurements: Ordered today Breast Cancer Screening: N/A CVD Screening: Done today Cervical Cancer Screening: N/A Colon Cancer Screening: N/A Depression Screening: Done today Diabetes Screening: Done today Glaucoma Screening: see your eye doctor Hepatitis B vaccine: N/A Hepatitis C screening: Up to date HIV Screening: N/A Flu Vaccine: get in the fall Lung cancer Screening: N/A Obesity Screening: done today Pneumonia Vaccines (2): up to date STI Screening: N/A  Please call to schedule your bone density: [facility]  Address: [address], [place], [place] [address] Phone: [phone]  [facility] [address] [place],  [place]  [address] Phone: [phone]    Kayla Johnson , Thank you for taking time to come for your Medicare Wellness Visit. I appreciate your ongoing commitment to your health goals. Please review the following plan we discussed and let me know if I can assist you in the future.   These are the goals we discussed:  Goals      DIET - INCREASE WATER INTAKE     Recommend drinking at least 6-8 glasses of water a day         This is a list of the screening recommended for you and due dates:  Health Maintenance  Topic Date Due   Zoster (Shingles) Vaccine (1 of 2) Never done   DEXA scan (bone density measurement)  01/18/2020   Medicare Annual Wellness Visit  11/20/2023   DTaP/Tdap/Td vaccine (1 - Tdap) 06/20/2025*   Flu Shot  07/01/2024   Pneumococcal Vaccine for age over 9  Completed   Hepatitis B Vaccine  Aged Out   HPV Vaccine  Aged Out   Meningitis B Vaccine  Aged Out   COVID-19 Vaccine  Discontinued   Hepatitis C Screening  Discontinued   Cologuard (Stool DNA test)  Discontinued  *Topic was postponed. The date shown is not the original due date.

## 2024-06-21 LAB — CBC WITH DIFFERENTIAL/PLATELET
Basophils Absolute: 0 x10E3/uL (ref 0.0–0.2)
Basos: 0 %
EOS (ABSOLUTE): 0 x10E3/uL (ref 0.0–0.4)
Eos: 0 %
Hematocrit: 37.1 % (ref 34.0–46.6)
Hemoglobin: 11.8 g/dL (ref 11.1–15.9)
Immature Grans (Abs): 0 x10E3/uL (ref 0.0–0.1)
Immature Granulocytes: 0 %
Lymphocytes Absolute: 1.7 x10E3/uL (ref 0.7–3.1)
Lymphs: 23 %
MCH: 27.4 pg (ref 26.6–33.0)
MCHC: 31.8 g/dL (ref 31.5–35.7)
MCV: 86 fL (ref 79–97)
Monocytes Absolute: 0.8 x10E3/uL (ref 0.1–0.9)
Monocytes: 10 %
Neutrophils Absolute: 4.9 x10E3/uL (ref 1.4–7.0)
Neutrophils: 67 %
Platelets: 301 x10E3/uL (ref 150–450)
RBC: 4.31 x10E6/uL (ref 3.77–5.28)
RDW: 14.6 % (ref 11.7–15.4)
WBC: 7.3 x10E3/uL (ref 3.4–10.8)

## 2024-06-21 LAB — COMPREHENSIVE METABOLIC PANEL WITH GFR
ALT: 12 IU/L (ref 0–32)
AST: 21 IU/L (ref 0–40)
Albumin: 4.2 g/dL (ref 3.8–4.8)
Alkaline Phosphatase: 120 IU/L (ref 44–121)
BUN/Creatinine Ratio: 9 — ABNORMAL LOW (ref 12–28)
BUN: 7 mg/dL — ABNORMAL LOW (ref 8–27)
Bilirubin Total: 0.6 mg/dL (ref 0.0–1.2)
CO2: 23 mmol/L (ref 20–29)
Calcium: 9.4 mg/dL (ref 8.7–10.3)
Chloride: 102 mmol/L (ref 96–106)
Creatinine, Ser: 0.75 mg/dL (ref 0.57–1.00)
Globulin, Total: 2.2 g/dL (ref 1.5–4.5)
Glucose: 96 mg/dL (ref 70–99)
Potassium: 4.1 mmol/L (ref 3.5–5.2)
Sodium: 140 mmol/L (ref 134–144)
Total Protein: 6.4 g/dL (ref 6.0–8.5)
eGFR: 80 mL/min/1.73 (ref >59)

## 2024-06-21 LAB — LIPID PANEL W/O CHOL/HDL RATIO
Cholesterol, Total: 209 mg/dL — ABNORMAL HIGH (ref 100–199)
HDL: 66 mg/dL (ref >39)
LDL Chol Calc (NIH): 132 mg/dL — ABNORMAL HIGH (ref 0–99)
Triglycerides: 63 mg/dL (ref 0–149)
VLDL Cholesterol Cal: 11 mg/dL (ref 5–40)

## 2024-06-21 LAB — VITAMIN D 25 HYDROXY (VIT D DEFICIENCY, FRACTURES): Vit D, 25-Hydroxy: 44.4 ng/mL (ref 30.0–100.0)

## 2024-06-21 LAB — TSH: TSH: 1.52 u[IU]/mL (ref 0.450–4.500)

## 2024-06-21 LAB — VITAMIN B12: Vitamin B-12: 847 pg/mL (ref 232–1245)

## 2024-06-22 ENCOUNTER — Ambulatory Visit: Payer: Self-pay | Admitting: Family Medicine

## 2024-06-23 ENCOUNTER — Ambulatory Visit: Attending: Cardiovascular Disease

## 2024-06-23 DIAGNOSIS — R002 Palpitations: Secondary | ICD-10-CM | POA: Diagnosis not present

## 2024-06-23 DIAGNOSIS — I700 Atherosclerosis of aorta: Secondary | ICD-10-CM | POA: Diagnosis not present

## 2024-06-23 DIAGNOSIS — I471 Supraventricular tachycardia, unspecified: Secondary | ICD-10-CM

## 2024-06-23 LAB — ECHOCARDIOGRAM COMPLETE
AR max vel: 2.65 cm2
AV Area VTI: 2.39 cm2
AV Area mean vel: 2.67 cm2
AV Mean grad: 2 mmHg
AV Peak grad: 3.3 mmHg
Ao pk vel: 0.91 m/s
Area-P 1/2: 3.72 cm2
S' Lateral: 2.13 cm

## 2024-06-23 NOTE — Assessment & Plan Note (Signed)
 Rechecking labs today. Await results. Treat as needed.

## 2024-06-23 NOTE — Assessment & Plan Note (Signed)
 Reassured patient. Continue to monitor. Call with any concerns.

## 2024-06-23 NOTE — Assessment & Plan Note (Signed)
 Will keep BP and cholesterol under good control. Continue to monitor. Call with any concerns.

## 2024-06-25 ENCOUNTER — Ambulatory Visit: Payer: Self-pay | Admitting: Cardiovascular Disease

## 2024-07-01 DIAGNOSIS — L255 Unspecified contact dermatitis due to plants, except food: Secondary | ICD-10-CM | POA: Diagnosis not present

## 2024-07-01 DIAGNOSIS — L570 Actinic keratosis: Secondary | ICD-10-CM | POA: Diagnosis not present

## 2024-07-01 DIAGNOSIS — L821 Other seborrheic keratosis: Secondary | ICD-10-CM | POA: Diagnosis not present

## 2024-07-02 DIAGNOSIS — L237 Allergic contact dermatitis due to plants, except food: Secondary | ICD-10-CM | POA: Diagnosis not present

## 2024-07-13 DIAGNOSIS — R55 Syncope and collapse: Secondary | ICD-10-CM | POA: Diagnosis not present

## 2024-07-15 DIAGNOSIS — R55 Syncope and collapse: Secondary | ICD-10-CM

## 2024-07-20 DIAGNOSIS — L638 Other alopecia areata: Secondary | ICD-10-CM | POA: Diagnosis not present

## 2024-07-20 DIAGNOSIS — L648 Other androgenic alopecia: Secondary | ICD-10-CM | POA: Diagnosis not present

## 2024-08-02 ENCOUNTER — Encounter: Payer: Self-pay | Admitting: Family Medicine

## 2024-08-02 ENCOUNTER — Ambulatory Visit (INDEPENDENT_AMBULATORY_CARE_PROVIDER_SITE_OTHER): Admitting: Family Medicine

## 2024-08-02 VITALS — BP 138/62 | HR 70 | Temp 98.0°F | Ht 62.0 in | Wt 109.6 lb

## 2024-08-02 DIAGNOSIS — L659 Nonscarring hair loss, unspecified: Secondary | ICD-10-CM | POA: Insufficient documentation

## 2024-08-02 DIAGNOSIS — I471 Supraventricular tachycardia, unspecified: Secondary | ICD-10-CM | POA: Diagnosis not present

## 2024-08-02 DIAGNOSIS — Z780 Asymptomatic menopausal state: Secondary | ICD-10-CM

## 2024-08-02 MED ORDER — METOPROLOL SUCCINATE ER 25 MG PO TB24: 25.0000 mg | ORAL_TABLET | Freq: Every day | ORAL | 0 refills | Status: AC | PRN

## 2024-08-02 NOTE — Assessment & Plan Note (Signed)
 About to start 100mg  spironalactone through dermatology with follow up in 6 months. Concern for hypotension. Will follow up in 6 weeks to check BP. Call with any concerns.

## 2024-08-02 NOTE — Progress Notes (Signed)
 BP 138/62   Pulse 70   Temp 98 F (36.7 C) (Oral)   Ht 5' 2 (1.575 m)   Wt 109 lb 9.6 oz (49.7 kg)   SpO2 96%   BMI 20.05 kg/m    Subjective:    Patient ID: Kayla Johnson, female    DOB: February 24, 1946, 78 y.o.   MRN: [id number]  HPI: Kayla Johnson is a 78 y.o. female  Chief Complaint  Patient presents with   Results    Zio patch   Feeling much better. Not anywhere near as sleepy as she was last time she was in.   HYPERTENSION  Hypertension status: controlled  Satisfied with current treatment? yes Duration of hypertension: chronic BP monitoring frequency:  rarely BP medication side effects:  no Medication compliance: excellent Previous BP meds:PRN metoprolol  Aspirin: no Recurrent headaches: no Visual changes: no Palpitations: no Dyspnea: no Chest pain: no Lower extremity edema: no Dizzy/lightheaded: no   Relevant past medical, surgical, family and social history reviewed and updated as indicated. Interim medical history since our last visit reviewed. Allergies and medications reviewed and updated.  Review of Systems  Constitutional: Negative.   Respiratory: Negative.    Cardiovascular:  Positive for palpitations. Negative for chest pain and leg swelling.  Musculoskeletal: Negative.   Skin: Negative.   Psychiatric/Behavioral: Negative.      Per HPI unless specifically indicated above     Objective:    BP 138/62   Pulse 70   Temp 98 F (36.7 C) (Oral)   Ht 5' 2 (1.575 m)   Wt 109 lb 9.6 oz (49.7 kg)   SpO2 96%   BMI 20.05 kg/m   Wt Readings from Last 3 Encounters:  08/02/24 109 lb 9.6 oz (49.7 kg)  06/20/24 108 lb 6.4 oz (49.2 kg)  06/01/24 108 lb 6 oz (49.2 kg)    Physical Exam Vitals and nursing note reviewed.  Constitutional:      General: She is not in acute distress.    Appearance: Normal appearance. She is normal weight. She is not ill-appearing, toxic-appearing or diaphoretic.  HENT:     Head: Normocephalic and atraumatic.     Right  Ear: External ear normal.     Left Ear: External ear normal.     Nose: Nose normal.     Mouth/Throat:     Mouth: Mucous membranes are moist.     Pharynx: Oropharynx is clear.  Eyes:     General: No scleral icterus.       Right eye: No discharge.        Left eye: No discharge.     Extraocular Movements: Extraocular movements intact.     Conjunctiva/sclera: Conjunctivae normal.     Pupils: Pupils are equal, round, and reactive to light.  Cardiovascular:     Rate and Rhythm: Normal rate and regular rhythm.     Pulses: Normal pulses.     Heart sounds: Normal heart sounds. No murmur heard.    No friction rub. No gallop.  Pulmonary:     Effort: Pulmonary effort is normal. No respiratory distress.     Breath sounds: Normal breath sounds. No stridor. No wheezing, rhonchi or rales.  Chest:     Chest Dickmann: No tenderness.  Musculoskeletal:        General: Normal range of motion.     Cervical back: Normal range of motion and neck supple.  Skin:    General: Skin is warm and dry.  Capillary Refill: Capillary refill takes less than 2 seconds.     Coloration: Skin is not jaundiced or pale.     Findings: No bruising, erythema, lesion or rash.  Neurological:     General: No focal deficit present.     Mental Status: She is alert and oriented to person, place, and time. Mental status is at baseline.  Psychiatric:        Mood and Affect: Mood normal.        Behavior: Behavior normal.        Thought Content: Thought content normal.        Judgment: Judgment normal.     Results for orders placed or performed in visit on 06/23/24  ECHOCARDIOGRAM COMPLETE   Collection Time: 06/23/24 12:04 PM  Result Value Ref Range   AR max vel 2.65 cm2   AV Peak grad 3.3 mmHg   Ao pk vel 0.91 m/s   S' Lateral 2.13 cm   Area-P 1/2 3.72 cm2   AV Area VTI 2.39 cm2   AV Mean grad 2.0 mmHg   AV Area mean vel 2.67 cm2   Est EF 60 - 65%       Assessment & Plan:   Problem List Items Addressed This Visit        Cardiovascular and Mediastinum   SVT (supraventricular tachycardia) (HCC) - Primary   Relevant Medications   metoprolol  succinate (TOPROL -XL) 25 MG 24 hr tablet   minoxidil (LONITEN) 2.5 MG tablet   spironolactone (ALDACTONE) 100 MG tablet     Musculoskeletal and Integument   Alopecia   About to start 100mg  spironalactone through dermatology with follow up in 6 months. Concern for hypotension. Will follow up in 6 weeks to check BP. Call with any concerns.       Other Visit Diagnoses       Postmenopausal estrogen deficiency       Needs DEXA. Ordered today.   Relevant Orders   DG Bone Density        Follow up plan: Return in about 6 weeks (around 09/13/2024) for follow up BP.

## 2024-08-10 DIAGNOSIS — L570 Actinic keratosis: Secondary | ICD-10-CM | POA: Diagnosis not present

## 2024-08-10 DIAGNOSIS — L700 Acne vulgaris: Secondary | ICD-10-CM | POA: Diagnosis not present

## 2024-08-10 DIAGNOSIS — L720 Epidermal cyst: Secondary | ICD-10-CM | POA: Diagnosis not present

## 2024-09-14 ENCOUNTER — Ambulatory Visit: Payer: Self-pay

## 2024-09-14 ENCOUNTER — Ambulatory Visit: Admitting: Family Medicine

## 2024-09-14 NOTE — Telephone Encounter (Signed)
 FYI Only or Action Required?: FYI only for provider.  Patient was last seen in primary care on 08/02/2024 by Vicci Bouchard P, DO.  Called Nurse Triage reporting Leg Pain.  Symptoms began several years ago.  Interventions attempted: Prescription medications: Zanaflex  and Other: Massaging leg and walking around.  Symptoms are: gradually worsening.  Triage Disposition: See Physician Within 24 Hours  Patient/caregiver understands and will follow disposition?: Yes Copied from CRM [id number]. Topic: Clinical - Red Word Triage >> Sep 14, 2024  9:55 AM Everette C wrote: [facility] that prompted transfer to Nurse Triage: The patient shares that they experienced severe pain in their left leg last night 09/13/24  Reason for Disposition  Numbness in a leg or foot (i.e., loss of sensation)  Answer Assessment - Initial Assessment Questions Pattern the leg pain follows is onset of severe foot/leg cramping pain lasting about 1-2 minutes, followed by onset of moderate/severe numbness lasting about a minute.   1. ONSET: When did the pain start?      Increased pain last night. Pain started out in left foot only a few years ago, in past few weeks has now started to involve left leg up to the knee.  2. LOCATION: Where is the pain located?      Left leg up to knee  3. PAIN: How bad is the pain?    (Scale 1-10; or mild, moderate, severe)     10/10 cramping pain last night for 1-2 minutes. Took Zanaflex  last night which helped. Massaging leg and walking improves pain.   4. WORK OR EXERCISE: Has there been any recent work or exercise that involved this part of the body?      No  5. CAUSE: What do you think is causing the leg pain?     Unsure  6. OTHER SYMPTOMS: Do you have any other symptoms? (e.g., chest pain, back pain, breathing difficulty, swelling, rash, fever, numbness, weakness)     Numbness in left leg now that sets in after the pain resolves. Lasts 1-2 minutes then numbness resolves.  Pt also reporting stiffness in thumb of right hand, asking if this could also be evaluated.  Protocols used: Leg Pain-A-AH

## 2024-09-14 NOTE — Telephone Encounter (Signed)
 FYI Only or Action Required?: FYI only for provider.  Patient was last seen in primary care on 08/02/2024 by Vicci Bouchard P, DO.  Called Nurse Triage reporting Leg Pain.  Symptoms began several years ago.  Interventions attempted: Prescription medications: tizanidine .  Symptoms are: gradually worsening.  Triage Disposition: No disposition on file.  Patient/caregiver understands and will follow disposition?: Copied from CRM #[id number]. Topic: Clinical - Red Word Triage >> Sep 14, 2024 11:16 AM Tinnie BROCKS wrote: Red Word that prompted transfer to Nurse Triage: Pt was triaged earlier today for severe leg pain and intermittent numbness and is wanting to move her appt to next avail with Dr. Vicci which is not for a couple weeks. She says its okay and if it gets worse she will go to emergency room. Answer Assessment - Initial Assessment Questions 1. REASON FOR CALL: What is the main reason for your call? or How can I best help you?     Patient unable to make appt this afternoon- rescheduled with Dr Vicci her PCP to evaluate and placed on the waitlist.  Patient symptoms described as a charley horse type cramp in her right leg that is very painful with some numbness when cramped. She states she has tizanidine  she can utilize at night to help alleviate the cramp. Denies CVA sx. Discussed ED/UC/call back instructions. She will pick up some electrolyte replacement drinks and work on her hydration bc she doesn't drink much during the day  Protocols used: Information Only Call - No Triage-A-AH

## 2024-09-19 ENCOUNTER — Ambulatory Visit: Admitting: Family Medicine

## 2024-09-20 ENCOUNTER — Ambulatory Visit (INDEPENDENT_AMBULATORY_CARE_PROVIDER_SITE_OTHER): Admitting: Family Medicine

## 2024-09-20 ENCOUNTER — Encounter: Payer: Self-pay | Admitting: Family Medicine

## 2024-09-20 ENCOUNTER — Ambulatory Visit
Admission: RE | Admit: 2024-09-20 | Discharge: 2024-09-20 | Disposition: A | Discharge status: Home / Self Care | Source: Ambulatory Visit | Attending: Family Medicine | Admitting: Family Medicine

## 2024-09-20 ENCOUNTER — Ambulatory Visit: Payer: Self-pay | Admitting: Family Medicine

## 2024-09-20 VITALS — BP 137/82 | HR 98 | Temp 97.5°F | Ht 62.0 in | Wt 109.4 lb

## 2024-09-20 DIAGNOSIS — M79642 Pain in left hand: Secondary | ICD-10-CM | POA: Diagnosis present

## 2024-09-20 DIAGNOSIS — M79662 Pain in left lower leg: Secondary | ICD-10-CM

## 2024-09-20 DIAGNOSIS — M79641 Pain in right hand: Secondary | ICD-10-CM | POA: Diagnosis not present

## 2024-09-20 DIAGNOSIS — R202 Paresthesia of skin: Secondary | ICD-10-CM | POA: Diagnosis not present

## 2024-09-20 LAB — BAYER DCA HB A1C WAIVED: HB A1C (BAYER DCA - WAIVED): 5.8 % — ABNORMAL HIGH (ref 4.8–5.6)

## 2024-09-20 MED ORDER — DICLOFENAC SODIUM 1 % EX GEL: 2.0000 g | Freq: Four times a day (QID) | CUTANEOUS | 1 refills | Status: AC

## 2024-09-20 NOTE — Patient Instructions (Signed)
 US  appt 2:45 today   [address] [place], [place] [address] Phone: [phone]

## 2024-09-20 NOTE — Addendum Note (Signed)
 Addended by: VICCI DUWAINE SQUIBB on: 09/20/2024 02:58 PM   Modules accepted: Orders

## 2024-09-20 NOTE — Progress Notes (Signed)
 BP 137/82   Pulse 98   Temp (!) 97.5 F (36.4 C) (Oral)   Ht 5' 2 (1.575 m)   Wt 109 lb 6.4 oz (49.6 kg)   SpO2 100%   BMI 20.01 kg/m    Subjective:    Patient ID: Kayla Johnson, female    DOB: 02/26/1946, 78 y.o.   MRN: [id number]  HPI: Kayla Johnson is a 78 y.o. female  Chief Complaint  Patient presents with   Leg Pain    Onset about 3 weeks ago. Numbness and pain in left leg. Has only occurred few times. Last for about 1 to 2 each time. Denies weakness   Numbness    Left foot numbness. Onset few years ago. Comes and goes. Denies weekness    Hand Pain    Right hand. Onset about 3 weeks ago. Pain, numbness, and pain, Does have weakness. LROM in index finger and thumb. Lost of grip.    LEG PAIN Duration: 3 weeks- worse about a week ago Pain: yes Severity: 10/10  Quality:  numb  Location:  L Foot to lower leg Bilateral:  no Onset: sudden Frequency: 2x in the last 3 weeks Time of  day:   wakes up with it Sudden unintentional leg jerking:   no Paresthesias:   yes Decreased sensation:  yes Weakness:   yes Insomnia:   no Fatigue:   yes Status: fluctuating Treatments attempted: Tizanidine   HAND PAIN Duration: 3 weeks Involved hand: R DIP joint of thumb Mechanism of injury: unknown Location:  R DIP joint of thumb Onset: gradual Severity: moderate  Quality: aching and numb Frequency: constant Radiation: no Treatments attempted: nothing Relief with NSAIDs?: No NSAIDs Taken Weakness: yes Numbness: yes Redness: no Swelling:no Bruising: no Fevers: no   Relevant past medical, surgical, family and social history reviewed and updated as indicated. Interim medical history since our last visit reviewed. Allergies and medications reviewed and updated.  Review of Systems  Constitutional: Negative.   Respiratory: Negative.    Cardiovascular: Negative.   Musculoskeletal:  Positive for joint swelling and myalgias. Negative for arthralgias, back pain, gait problem,  neck pain and neck stiffness.  Skin: Negative.   Neurological:  Positive for numbness. Negative for dizziness, tremors, seizures, syncope, facial asymmetry, speech difficulty, weakness, light-headedness and headaches.  Psychiatric/Behavioral: Negative.      Per HPI unless specifically indicated above     Objective:    BP 137/82   Pulse 98   Temp (!) 97.5 F (36.4 C) (Oral)   Ht 5' 2 (1.575 m)   Wt 109 lb 6.4 oz (49.6 kg)   SpO2 100%   BMI 20.01 kg/m   Wt Readings from Last 3 Encounters:  09/20/24 109 lb 6.4 oz (49.6 kg)  08/02/24 109 lb 9.6 oz (49.7 kg)  06/20/24 108 lb 6.4 oz (49.2 kg)    Physical Exam Vitals and nursing note reviewed.  Constitutional:      General: She is not in acute distress.    Appearance: Normal appearance. She is not ill-appearing, toxic-appearing or diaphoretic.  HENT:     Head: Normocephalic and atraumatic.     Right Ear: External ear normal.     Left Ear: External ear normal.     Nose: Nose normal.     Mouth/Throat:     Mouth: Mucous membranes are moist.     Pharynx: Oropharynx is clear.  Eyes:     General: No scleral icterus.  Right eye: No discharge.        Left eye: No discharge.     Extraocular Movements: Extraocular movements intact.     Conjunctiva/sclera: Conjunctivae normal.     Pupils: Pupils are equal, round, and reactive to light.  Cardiovascular:     Rate and Rhythm: Normal rate and regular rhythm.     Pulses: Normal pulses.     Heart sounds: Normal heart sounds. No murmur heard.    No friction rub. No gallop.  Pulmonary:     Effort: Pulmonary effort is normal. No respiratory distress.     Breath sounds: Normal breath sounds. No stridor. No wheezing, rhonchi or rales.  Chest:     Chest [place]: No tenderness.  Musculoskeletal:        General: Normal range of motion.     Cervical back: Normal range of motion and neck supple.     Comments: + squeeze test on the L, negative Homan's   Skin:    General: Skin is warm and  dry.     Capillary Refill: Capillary refill takes less than 2 seconds.     Coloration: Skin is not jaundiced or pale.     Findings: No bruising, erythema, lesion or rash.  Neurological:     General: No focal deficit present.     Mental Status: She is alert and oriented to person, place, and time. Mental status is at baseline.  Psychiatric:        Mood and Affect: Mood normal.        Behavior: Behavior normal.        Thought Content: Thought content normal.        Judgment: Judgment normal.     Results for orders placed or performed in visit on 06/23/24  ECHOCARDIOGRAM COMPLETE   Collection Time: 06/23/24 12:04 PM  Result Value Ref Range   AR max vel 2.65 cm2   AV Peak grad 3.3 mmHg   Ao pk vel 0.91 m/s   S' Lateral 2.13 cm   Area-P 1/2 3.72 cm2   AV Area VTI 2.39 cm2   AV Mean grad 2.0 mmHg   AV Area mean vel 2.67 cm2   Est EF 60 - 65%       Assessment & Plan:   Problem List Items Addressed This Visit   None Visit Diagnoses       Pain of left calf    -  Primary   Given pain will r/o DVT today- checking labs. Await results. Call with any concerns.   Relevant Orders   US  Venous Img Lower Unilateral Left     Left hand pain       Concern for arthrtitis. Will start voltaren  and check x-ray. Await results.   Relevant Orders   DG Hand Complete Left     Paresthesias       Will check labs- await results. Treat as needed.   Relevant Orders   CBC with Differential/Platelet   Comprehensive metabolic panel with GFR   TSH   Bayer DCA Hb A1c Waived   B12   Magnesium   Phosphorus        Follow up plan: Return if symptoms worsen or fail to improve.

## 2024-09-23 LAB — VITAMIN B12: Vitamin B-12: 1227 pg/mL (ref 232–1245)

## 2024-09-23 LAB — COMPREHENSIVE METABOLIC PANEL WITH GFR
ALT: 12 IU/L (ref 0–32)
AST: 19 IU/L (ref 0–40)
Albumin: 4.5 g/dL (ref 3.8–4.8)
Alkaline Phosphatase: 96 IU/L (ref 49–135)
BUN/Creatinine Ratio: 11 — ABNORMAL LOW (ref 12–28)
BUN: 8 mg/dL (ref 8–27)
Bilirubin Total: 0.4 mg/dL (ref 0.0–1.2)
CO2: 27 mmol/L (ref 20–29)
Calcium: 9.5 mg/dL (ref 8.7–10.3)
Chloride: 102 mmol/L (ref 96–106)
Creatinine, Ser: 0.76 mg/dL (ref 0.57–1.00)
Globulin, Total: 2 g/dL (ref 1.5–4.5)
Glucose: 98 mg/dL (ref 70–99)
Potassium: 3.9 mmol/L (ref 3.5–5.2)
Sodium: 141 mmol/L (ref 134–144)
Total Protein: 6.5 g/dL (ref 6.0–8.5)
eGFR: 79 mL/min/1.73 (ref >59)

## 2024-09-23 LAB — CBC WITH DIFFERENTIAL/PLATELET
Basophils Absolute: 0 x10E3/uL (ref 0.0–0.2)
Basos: 1 %
EOS (ABSOLUTE): 0.1 x10E3/uL (ref 0.0–0.4)
Eos: 2 %
Hematocrit: 40.6 % (ref 34.0–46.6)
Hemoglobin: 12.8 g/dL (ref 11.1–15.9)
Immature Grans (Abs): 0 x10E3/uL (ref 0.0–0.1)
Immature Granulocytes: 0 %
Lymphocytes Absolute: 1.6 x10E3/uL (ref 0.7–3.1)
Lymphs: 34 %
MCH: 27.1 pg (ref 26.6–33.0)
MCHC: 31.5 g/dL (ref 31.5–35.7)
MCV: 86 fL (ref 79–97)
Monocytes Absolute: 0.4 x10E3/uL (ref 0.1–0.9)
Monocytes: 8 %
Neutrophils Absolute: 2.7 x10E3/uL (ref 1.4–7.0)
Neutrophils: 55 %
Platelets: 338 x10E3/uL (ref 150–450)
RBC: 4.72 x10E6/uL (ref 3.77–5.28)
RDW: 14.7 % (ref 11.7–15.4)
WBC: 4.8 x10E3/uL (ref 3.4–10.8)

## 2024-09-23 LAB — TSH: TSH: 2.43 u[IU]/mL (ref 0.450–4.500)

## 2024-09-23 LAB — MAGNESIUM: Magnesium: 2 mg/dL (ref 1.6–2.3)

## 2024-09-23 LAB — PHOSPHORUS: Phosphorus: 3.9 mg/dL (ref 3.0–4.3)

## 2024-09-26 ENCOUNTER — Ambulatory Visit: Admitting: Family Medicine

## 2024-10-10 ENCOUNTER — Telehealth: Payer: Self-pay | Admitting: Family Medicine

## 2024-10-10 NOTE — Telephone Encounter (Signed)
 Can this referral be entered for the patient?

## 2024-10-10 NOTE — Telephone Encounter (Signed)
 Rhuematology will want labs before they will see her. Please schedule appt with me and then we can see if rheum is appropriate

## 2024-10-10 NOTE — Telephone Encounter (Unsigned)
 Copied from CRM [id number]. Topic: Referral - Request for Referral >> Oct 10, 2024 11:23 AM Kayla Johnson wrote: Did the patient discuss referral with their provider in the last year? No (If No - schedule appointment) (If Yes - send message)  Appointment offered? Yes  Type of order/referral and detailed reason for visit: rheumatology for arthritis  Preference of office, provider, location: female provider if possible  If referral order, have you been seen by this specialty before? No (If Yes, this issue or another issue? When? Where?  Can we respond through MyChart? No

## 2024-10-10 NOTE — Telephone Encounter (Signed)
 Scheduled

## 2024-10-21 ENCOUNTER — Ambulatory Visit (INDEPENDENT_AMBULATORY_CARE_PROVIDER_SITE_OTHER): Admitting: Family Medicine

## 2024-10-21 ENCOUNTER — Encounter: Payer: Self-pay | Admitting: Family Medicine

## 2024-10-21 VITALS — BP 122/73 | HR 92 | Temp 98.0°F | Ht 62.0 in | Wt 108.4 lb

## 2024-10-21 DIAGNOSIS — M25541 Pain in joints of right hand: Secondary | ICD-10-CM | POA: Diagnosis not present

## 2024-10-21 DIAGNOSIS — J309 Allergic rhinitis, unspecified: Secondary | ICD-10-CM | POA: Diagnosis not present

## 2024-10-21 MED ORDER — CETIRIZINE HCL 10 MG PO TABS: 10.0000 mg | ORAL_TABLET | Freq: Every day | ORAL | 3 refills | Status: AC

## 2024-10-21 NOTE — Progress Notes (Signed)
 BP 122/73   Pulse 92   Temp 98 F (36.7 C) (Oral)   Ht 5' 2 (1.575 m)   Wt 108 lb 6.4 oz (49.2 kg)   SpO2 96%   BMI 19.83 kg/m    Subjective:    Patient ID: Kayla Johnson, female    DOB: 1946-07-18, 78 y.o.   MRN: [id number]  HPI: Kayla Johnson is a 78 y.o. female  Chief Complaint  Patient presents with   Arthritis    Right thumb. LROM. Painful    [name] has been having some significant joint pain in her R thumb. She had an x-ray which showed osteoarthritis. She would like to go see rheumatology. She notes that it's snapping when she's trying to bend it. She has has been using ice and heat and has been using voltaren  without any benefit.   She has been started on spironalactone for hair loss. She was anxious about taking the whole 100mg  dose. She was only taking 25mg  of her sprionalactone last week and has now started taking 50mg  this week. No dizziness. No other concerns or complaints at this time.   Relevant past medical, surgical, family and social history reviewed and updated as indicated. Interim medical history since our last visit reviewed. Allergies and medications reviewed and updated.  Review of Systems  Constitutional: Negative.   Respiratory: Negative.    Cardiovascular: Negative.   Musculoskeletal:  Positive for arthralgias and joint swelling. Negative for back pain, gait problem, myalgias, neck pain and neck stiffness.  Skin: Negative.   Neurological: Negative.   Psychiatric/Behavioral: Negative.      Per HPI unless specifically indicated above     Objective:    BP 122/73   Pulse 92   Temp 98 F (36.7 C) (Oral)   Ht 5' 2 (1.575 m)   Wt 108 lb 6.4 oz (49.2 kg)   SpO2 96%   BMI 19.83 kg/m   Wt Readings from Last 3 Encounters:  10/21/24 108 lb 6.4 oz (49.2 kg)  09/20/24 109 lb 6.4 oz (49.6 kg)  08/02/24 109 lb 9.6 oz (49.7 kg)    Physical Exam Vitals and nursing note reviewed.  Constitutional:      General: She is not in acute distress.     Appearance: Normal appearance. She is not ill-appearing, toxic-appearing or diaphoretic.  HENT:     Head: Normocephalic and atraumatic.     Right Ear: External ear normal.     Left Ear: External ear normal.     Nose: Nose normal.     Mouth/Throat:     Mouth: Mucous membranes are moist.     Pharynx: Oropharynx is clear.  Eyes:     General: No scleral icterus.       Right eye: No discharge.        Left eye: No discharge.     Extraocular Movements: Extraocular movements intact.     Conjunctiva/sclera: Conjunctivae normal.     Pupils: Pupils are equal, round, and reactive to light.  Cardiovascular:     Rate and Rhythm: Normal rate and regular rhythm.     Pulses: Normal pulses.     Heart sounds: Normal heart sounds. No murmur heard.    No friction rub. No gallop.  Pulmonary:     Effort: Pulmonary effort is normal. No respiratory distress.     Breath sounds: Normal breath sounds. No stridor. No wheezing, rhonchi or rales.  Chest:     Chest [place]: No tenderness.  Musculoskeletal:        General: Deformity (1st MCP and thumb on R hand) present. Normal range of motion.     Cervical back: Normal range of motion and neck supple.  Skin:    General: Skin is warm and dry.     Capillary Refill: Capillary refill takes less than 2 seconds.     Coloration: Skin is not jaundiced or pale.     Findings: No bruising, erythema, lesion or rash.  Neurological:     General: No focal deficit present.     Mental Status: She is alert and oriented to person, place, and time. Mental status is at baseline.  Psychiatric:        Mood and Affect: Mood normal.        Behavior: Behavior normal.        Thought Content: Thought content normal.        Judgment: Judgment normal.     Results for orders placed or performed in visit on 09/20/24  Bayer DCA Hb A1c Waived   Collection Time: 09/20/24  9:58 AM  Result Value Ref Range   HB A1C (BAYER DCA - WAIVED) 5.8 (H) 4.8 - 5.6 %  CBC with Differential/Platelet    Collection Time: 09/20/24  9:59 AM  Result Value Ref Range   WBC 4.8 3.4 - 10.8 x10E3/uL   RBC 4.72 3.77 - 5.28 x10E6/uL   Hemoglobin 12.8 11.1 - 15.9 g/dL   Hematocrit 59.3 65.9 - 46.6 %   MCV 86 79 - 97 fL   MCH 27.1 26.6 - 33.0 pg   MCHC 31.5 31.5 - 35.7 g/dL   RDW 85.2 88.2 - 84.5 %   Platelets 338 150 - 450 x10E3/uL   Neutrophils 55 Not Estab. %   Lymphs 34 Not Estab. %   Monocytes 8 Not Estab. %   Eos 2 Not Estab. %   Basos 1 Not Estab. %   Neutrophils Absolute 2.7 1.4 - 7.0 x10E3/uL   Lymphocytes Absolute 1.6 0.7 - 3.1 x10E3/uL   Monocytes Absolute 0.4 0.1 - 0.9 x10E3/uL   EOS (ABSOLUTE) 0.1 0.0 - 0.4 x10E3/uL   Basophils Absolute 0.0 0.0 - 0.2 x10E3/uL   Immature Granulocytes 0 Not Estab. %   Immature Grans (Abs) 0.0 0.0 - 0.1 x10E3/uL  Comprehensive metabolic panel with GFR   Collection Time: 09/20/24  9:59 AM  Result Value Ref Range   Glucose 98 70 - 99 mg/dL   BUN 8 8 - 27 mg/dL   Creatinine, Ser 9.23 0.57 - 1.00 mg/dL   eGFR 79 >40 fO/fpw/8.26   BUN/Creatinine Ratio 11 (L) 12 - 28   Sodium 141 134 - 144 mmol/L   Potassium 3.9 3.5 - 5.2 mmol/L   Chloride 102 96 - 106 mmol/L   CO2 27 20 - 29 mmol/L   Calcium  9.5 8.7 - 10.3 mg/dL   Total Protein 6.5 6.0 - 8.5 g/dL   Albumin 4.5 3.8 - 4.8 g/dL   Globulin, Total 2.0 1.5 - 4.5 g/dL   Bilirubin Total 0.4 0.0 - 1.2 mg/dL   Alkaline Phosphatase 96 49 - 135 IU/L   AST 19 0 - 40 IU/L   ALT 12 0 - 32 IU/L  TSH   Collection Time: 09/20/24  9:59 AM  Result Value Ref Range   TSH 2.430 0.450 - 4.500 uIU/mL  B12   Collection Time: 09/20/24  9:59 AM  Result Value Ref Range   Vitamin B-12 1,227 232 - 1,245 pg/mL  Magnesium   Collection Time: 09/20/24  9:59 AM  Result Value Ref Range   Magnesium 2.0 1.6 - 2.3 mg/dL  Phosphorus   Collection Time: 09/20/24  9:59 AM  Result Value Ref Range   Phosphorus 3.9 3.0 - 4.3 mg/dL      Assessment & Plan:   Problem List Items Addressed This Visit       Respiratory    Allergic rhinitis   Relevant Medications   cetirizine  (ZYRTEC ) 10 MG tablet   Other Visit Diagnoses       Arthralgia of right hand    -  Primary   Likely osteoarthritis- will check labs and get her into hand surgery to discuss treatment. Call with any concerns.   Relevant Orders   RA Qn+CCP(IgG/A)+SjoSSA+SjoSSB   ANA 12 Plus Profile (RDL)   C-reactive protein   Uric acid   VITAMIN D  25 Hydroxy (Vit-D Deficiency, Fractures)   Comprehensive metabolic panel with GFR   Ambulatory referral to Hand Surgery        Follow up plan: Return for As scheduled.

## 2024-10-25 ENCOUNTER — Ambulatory Visit: Payer: Self-pay | Admitting: Family Medicine

## 2024-11-04 ENCOUNTER — Telehealth: Payer: Self-pay | Admitting: Family Medicine

## 2024-11-04 NOTE — Telephone Encounter (Signed)
 Copied from CRM #[id number]. Topic: Referral - Status >> Nov 03, 2024  3:23 PM Kayla Johnson wrote: Reason for CRM: Pt needs referral# [id number] redirected to [place].

## 2024-11-07 LAB — COMPREHENSIVE METABOLIC PANEL WITH GFR
ALT: 12 IU/L (ref 0–32)
AST: 22 IU/L (ref 0–40)
Albumin: 4.4 g/dL (ref 3.8–4.8)
Alkaline Phosphatase: 78 IU/L (ref 49–135)
BUN/Creatinine Ratio: 14 (ref 12–28)
BUN: 11 mg/dL (ref 8–27)
Bilirubin Total: 0.4 mg/dL (ref 0.0–1.2)
CO2: 26 mmol/L (ref 20–29)
Calcium: 9.4 mg/dL (ref 8.7–10.3)
Chloride: 102 mmol/L (ref 96–106)
Creatinine, Ser: 0.8 mg/dL (ref 0.57–1.00)
Globulin, Total: 2.1 g/dL (ref 1.5–4.5)
Glucose: 83 mg/dL (ref 70–99)
Potassium: 4.1 mmol/L (ref 3.5–5.2)
Sodium: 141 mmol/L (ref 134–144)
Total Protein: 6.5 g/dL (ref 6.0–8.5)
eGFR: 74 mL/min/1.73 (ref >59)

## 2024-11-07 LAB — ANA 12 PLUS PROFILE, POSITIVE
Anti-CCP Ab, IgG & IgA (RDL): <20 U (ref <20)
Anti-Cardiolipin Ab, IgA (RDL): <12 U/mL (ref <12)
Anti-Cardiolipin Ab, IgG (RDL): <15 (ref <15)
Anti-Cardiolipin Ab, IgM (RDL): 17 [MPL'U]/mL (ref <13)
Anti-Centromere Ab (RDL): <1:40 {titer}
Anti-Chromatin Ab, IgG (RDL): <20 U (ref <20)
Anti-La (SS-B) Ab (RDL): <20 U (ref <20)
Anti-Ro (SS-A) Ab (RDL): <20 U (ref <20)
Anti-Scl-70 Ab (RDL): <20 U (ref <20)
Anti-Sm Ab (RDL): <20 U (ref <20)
Anti-TPO Ab (RDL): 9.2 [IU]/mL — AB (ref <9.0)
Anti-U1 RNP Ab (RDL): <20 U (ref <20)
Anti-dsDNA Ab by Farr(RDL): <8 [IU]/mL (ref <8.0)
C3 Complement (RDL): 121 mg/dL (ref 90–180)
C4 Complement (RDL): 29 mg/dL (ref 10–40)
Homogeneous Pattern: 1:320 {titer} — ABNORMAL HIGH

## 2024-11-07 LAB — RA QN+CCP(IGG/A)+SJOSSA+SJOSSB
Cyclic Citrullin Peptide Ab: 9 U (ref 0–19)
ENA SSA (RO) Ab: <0.2 AI (ref 0.0–0.9)
ENA SSB (LA) Ab: <0.2 AI (ref 0.0–0.9)
Rheumatoid fact SerPl-aCnc: 11.8 [IU]/mL (ref <14.0)

## 2024-11-07 LAB — URIC ACID: Uric Acid: 4.5 mg/dL (ref 3.1–7.9)

## 2024-11-07 LAB — VITAMIN D 25 HYDROXY (VIT D DEFICIENCY, FRACTURES): Vit D, 25-Hydroxy: 45.9 ng/mL (ref 30.0–100.0)

## 2024-11-07 LAB — ANA 12 PLUS PROFILE (RDL): Anti-Nuclear Ab by IFA (RDL): POSITIVE — AB

## 2024-11-07 LAB — C-REACTIVE PROTEIN: CRP: <1 mg/L (ref 0–10)

## 2024-11-09 ENCOUNTER — Ambulatory Visit

## 2024-11-09 DIAGNOSIS — M65311 Trigger thumb, right thumb: Secondary | ICD-10-CM | POA: Diagnosis not present

## 2024-11-09 MED ORDER — LIDOCAINE HCL 1 % IJ SOLN
1.0000 mL | INTRAMUSCULAR | Status: AC | PRN
  Administered 2024-11-09: 1 mL

## 2024-11-09 NOTE — Patient Instructions (Signed)
 [facility] [address], [place] [address] [phone]   Thank you for visiting the office today. We appreciate your trust and allowing us  to help you with your orthopedic needs. We have discussed orthopedic transition of care at today's appointment. If you have any difficulties with referral or follow up, please notify the office.  If you experience life-threatening symptoms or it cannot wait until normal office hours, please go to the nearest Emergency Department for immediate evaluation.      Cortisone Injection Patient Information  -A cortisone injection has been recommended for you today during your office visit. The injection consists of two medications administered at the same time. The first medication is a numbing medication. This medication will help you to feel better today for a couple hours.  However, this medication will wear off today and your discomfort may return. The second medication is the steroid medication. This medication will usually take a few days for full effect.   -Some individuals, about 1 in 20, can have an increase in their pain after the injection for approximately 24-36 hours. This is called a steroid flare and may be associated with some slight flushing of the face. The best thing to do if this occurs is to ice the area as much as possible and take ibuprofen  if you are able.   -Any patients who are currently receiving treatment for problems with their blood sugar should be aware that this medication may cause you to have elevated blood sugar. This will typically occur for 24-36 hours after the injection. We recommend that you check your blood sugar regularly during this time and contact your primary care provider immediately or go to the nearest emergency room if the levels get to high.

## 2024-11-09 NOTE — Progress Notes (Signed)
 Orthopaedic Surgery New Patient Visit   History of Present Illness: The patient is a 78 y.o. right-hand-dominant female seen in clinic for right thumb pain.  Patient reports symptoms have been ongoing for several months, worsening as of late.  She describes pain in the base of the thumb and occasional locking sensation when she bends the thumb.  The locking sensation has progressively increased in frequency over the last few weeks.  She also has point tenderness at the base of her thumb and has difficulty with grabbing objects due to the pain.  Patient denies numbness or paresthesias in the thumb or hand, weakness.  She denies symptoms in other fingers.  Denies nighttime numbness.  No other complaints today.  Patient has tried Voltaren  gel and splint immobilization with minimal relief.  She was initially seen by her primary care provider who obtained x-rays and referred her to orthopedics for definitive management.     Past Medical, Social and Family History: Past Medical History:  Diagnosis Date   Carpal tunnel syndrome    COVID-19 12/06/2020   reported by patient   GERD (gastroesophageal reflux disease)    Lichen sclerosus    Pernicious anemia    Shoulder bursitis    Past Surgical History:  Procedure Laterality Date   ABDOMINAL HYSTERECTOMY     ESOPHAGOGASTRODUODENOSCOPY (EGD) WITH PROPOFOL  N/A 09/12/2020   Procedure: ESOPHAGOGASTRODUODENOSCOPY (EGD) WITH PROPOFOL ;  Surgeon: Toledo, Ladell POUR, MD;  Location: [facility];  Service: Gastroenterology;  Laterality: N/A;   Molar Pregnancy     S/P D & C   TONSILLECTOMY     Tubes & Ovaries removed  2015   Allergies  Allergen Reactions   Lidocaine  Tinitus   Current Outpatient Medications on File Prior to Visit  Medication Sig Dispense Refill   cetirizine  (ZYRTEC ) 10 MG tablet Take 1 tablet (10 mg total) by mouth daily. 100 tablet 3   clobetasol  ointment (TEMOVATE ) 0.05 % 1 application 2 times a week (Patient not taking: Reported on  10/21/2024) 30 g 2   Cyanocobalamin  (VITAMIN B 12 PO) Take by mouth daily.      diclofenac  Sodium (VOLTAREN ) 1 % GEL Apply 2 g topically 4 (four) times daily. 350 g 1   fluticasone  (FLONASE ) 50 MCG/ACT nasal spray Place 2 sprays into both nostrils daily. 16 g 2   metoprolol  succinate (TOPROL -XL) 25 MG 24 hr tablet Take 1 tablet (25 mg total) by mouth daily as needed. 90 tablet 0   minoxidil (LONITEN) 2.5 MG tablet Take 1 tablet (2.5 mg total) by mouth daily.     Multiple Vitamins-Minerals (CENTRUM SILVER 50+WOMEN PO) Take by mouth.     omeprazole  (PRILOSEC) 20 MG capsule Take 1 capsule (20 mg total) by mouth daily.     ondansetron  (ZOFRAN  ODT) 4 MG disintegrating tablet Take 1 tablet (4 mg total) by mouth every 8 (eight) hours as needed. 20 tablet 1   Simethicone  (GAS-X PO) Take by mouth.     spironolactone (ALDACTONE) 100 MG tablet Take 1 tablet (100 mg total) by mouth daily.     tiZANidine  (ZANAFLEX ) 4 MG tablet Take 1 tablet (4 mg total) by mouth every 8 (eight) hours as needed. 30 tablet 1   tretinoin (RETIN-A) 0.025 % cream Apply topically daily.     triamcinolone  cream (KENALOG ) 0.1 % Apply 1 Application topically 2 (two) times daily.     vitamin E 180 MG (400 UNITS) capsule Take 400 Units by mouth daily.     No current facility-administered  medications on file prior to visit.   Social History   Tobacco Use   Smoking status: Never   Smokeless tobacco: Never  Vaping Use   Vaping status: Never Used  Substance Use Topics   Alcohol use: No    Alcohol/week: 0.0 standard drinks of alcohol   Drug use: No      I have reviewed past medical, surgical, social and family history, medications and allergies as documented in the EMR.  Review of Systems - A ROS was performed including pertinent positives and negatives as documented in the HPI.     Physical Exam:  General/Constitutional: NAD Vascular: No edema, swelling or tenderness, except as noted in detailed exam Integumentary:  No impressive skin lesions present, except as noted in detailed exam Neuro/Psych: Normal mood and affect, oriented to person, place and time Musculoskeletal: Normal, except as noted in detailed exam and in HPI   Focused Orthopaedic Examination:  Hand/Digit focused exam:  On gross examination of the right upper extremity the skin is intact.  Fingers warm and well perfused with 2+ radial pulse.  Sensation intact to the Median, Ulnar and Radial nerve distribution of the hand.    AIN/PIN/Ulnar nerve motor function intact.  Negative tinels at the cubital tunnel, negative tinels at the carpal tunnel.    Full flexion and extension of the fingers without active triggering.  Positive for pain and nodule about the A1 pulley of the right thumb.  Palpable nodule about the A1 pulley of the right index finger but without pain or triggering.  APB strength 5/5 with no signs of atrophy.    Mild pain about the basilar thumb joint.   Mildly positive CMC grind.  Able to make a composite fist.          XR right hand imaging: X-rays of the right hand including PA, oblique, lateral were obtained on 09/20/2024 at outpatient facility and were available for my review today.  Per my interpretation, there are no acute fractures or dislocations.  Diffuse degenerative changes noted about multiple joints of the right hand including right thumb CMC joint, right thumb MCP joint.  Remote ulnar styloid process fracture.   Radiology Read: FINDINGS:   BONES AND JOINTS: Mild diffuse interphalangeal joint space narrowing consistent with osteoarthritis. Findings suggestive of remote ulnar styloid process fracture. Diffuse osteopenia. No acute fracture. No joint dislocation.   SOFT TISSUES: The soft tissues are unremarkable.   IMPRESSION: 1. Mild diffuse interphalangeal joint space narrowing consistent with osteoarthritis. 2. Findings suggestive of a remote ulnar styloid process fracture. 3. Diffuse  osteopenia.  Assessment:  Right hand trigger thumb  Plan:  Patient was seen and examined in office today. We reviewed patient's history, examination, and imaging in detail. Based on information available for this encounter, patient is likely symptomatic from right hand trigger thumb.  She has point tenderness over the A1 pulley of the right thumb with intermittent triggering.  She does not have numbness or paresthesias or other provocative signs on examination today.  We discussed conservative treatment measures including consideration of trigger finger injection.  Patient would like to proceed with injection today.  We did discuss patient's documented allergy to lidocaine , she reports the reaction occurred during an endoscopy procedure where she sat up and experienced ringing in the ears.  Patient states she has since received injections for dental work and has not had any reactions.  She would like to proceed with the injection including the lidocaine  component.  Risk and benefits were  discussed.  Patient acknowledges and understands.  We will plan on seeing patient back in 6 weeks for repeat evaluation.  She may call sooner with any new or worsening symptoms.   Patient education material was provided.  All questions, concerns and comments were addressed to the best of my ability.  Follow-up: 6 weeks  I discussed with the patient today that I will be transitioning out of my role within the near future. In order to provide appropriate continuity of care, we offered the patient options for follow up regarding their orthopedic concerns. Patient has chosen to follow up with [facility].  Patient may reach out to our office if there are any difficulties in scheduling follow up care. The patient understands who to contact for future orthopedic concerns and has contact information for the receiving practice.   Right thumb trigger finger injection procedure: The risks and benefits of a steroid injection  were discussed and the patient wishes to proceed.  The anatomic space overlying the volar A1 pulley of the right thumb was palpated, marked at the site of maximum tenderness and cleansed with Alcohol swab and ChloraPrep.  Using sterile technique, a 25 gauge needle was introduced into the region adjacent the A1 pulley.  The injectate which consisted of 1cc of 6 mg/mL of Betamethasone  and 1cc of 1% lidocaine  (w/o epi) was injected into the area.  The needle was withdrawn and pressure was applied for hemostasis.  A bandage was applied.  The patient tolerated the procedure well and was told to ice the area tonight if it is sore.  Steroid Injections You have received a steroid injection today. This injection consists of a numbing medication and steroid.  There may be side effects after receiving the injection.   If you are diabetic: Your blood sugar may increase for up to 48 hours.  Check it more often than normal.   General reactions: A cortisone flare reaction. This generally occurs 6-8 hours after receiving the injection.  Ice the area and take Tylenol for pain.  If the pain lasts longer than 48 hours call the office.   Some patients may experience flushing, increased heart rate, red face or increase in body temperature.  This is rare but can happen.   Over the counter Benadryl, if appropriate, often reduces these symptoms.  For a severe reaction contact your family doctor or go to the emergency room.  If you have any other questions, feel free to ask.   Hand/UE Inj: R thumb A1 for trigger finger on 11/09/2024 11:23 AM Indications: tendon swelling Details: 25 G needle, volar approach Medications: 1 mL lidocaine  1 % (Betamethasone  1cc 6mg /mL) Outcome: tolerated well, no immediate complications Procedure, treatment alternatives, risks and benefits explained, specific risks discussed. Patient was prepped and draped in the usual sterile fashion.       Arlyss GEANNIE Schneider, DO [facility]   This document was dictated using Dragon voice recognition software. A reasonable attempt at proof reading has been made to minimize errors.

## 2024-12-02 ENCOUNTER — Ambulatory Visit: Payer: Self-pay

## 2024-12-02 NOTE — Telephone Encounter (Signed)
 FYI Only or Action Required?: FYI only for provider: [facility].  Patient was last seen in primary care on 10/21/2024 by Vicci Bouchard P, DO.  Called Nurse Triage reporting Cough.  Symptoms began a week ago.  Interventions attempted: OTC medications: sudafed.  Symptoms are: gradually worsening.  Triage Disposition: See HCP Within 4 Hours (Or PCP Triage)  Patient/caregiver understands and will follow disposition?:   Copied from CRM [id number]. Topic: Clinical - Red Word Triage >> Dec 02, 2024 10:17 AM Donna BRAVO wrote: Red Word that prompted transfer to Nurse Triage:   pt no feeling good,  little bit short of breath, like whew when I sit down, a little short of breath.  sneezing, little cough.  Weekend  nasal degongestant.  Reason for Disposition  [1] MILD difficulty breathing (e.g., minimal/no SOB at rest, SOB with walking, pulse < 100) AND [2] still present when not coughing  Answer Assessment - Initial Assessment Questions No open appointments till Monday. Patient told to go to UC. 1. ONSET: When did the cough begin?      Friday 3. SPUTUM: Describe the color of your sputum (e.g., none, dry cough; clear, white, yellow, green)    Dry 5. DIFFICULTY BREATHING: Are you having difficulty breathing? If Yes, ask: How bad is it? (e.g., mild, moderate, severe)      Mild, just feels tired like she needs to sit down. 6. FEVER: Do you have a fever? If Yes, ask: What is your temperature, how was it measured, and when did it start?     Doesn't think so. 7. CARDIAC HISTORY: Do you have any history of heart disease? (e.g., heart attack, congestive heart failure)      Denies 8. LUNG HISTORY: Do you have any history of lung disease?  (e.g., pulmonary embolus, asthma, emphysema)     Denies 9. PE RISK FACTORS: Do you have a history of blood clots? (or: recent major surgery, recent prolonged travel, bedridden)     Denies 10. OTHER SYMPTOMS: Do you have any other symptoms? (e.g.,  runny nose, wheezing, chest pain)       Sneezing, runny nose, feels congestion. 12. TRAVEL: Have you traveled out of the country in the last month? (e.g., travel history, exposures)       Denies  Protocols used: Cough - Acute Non-Productive-A-AH

## 2024-12-02 NOTE — Telephone Encounter (Signed)
 I called the patient and left a VM for her to give us  a call back to be scheduled in for an acute visit. If she calls back in it's ok for E2C2 to schedule her in.

## 2024-12-21 ENCOUNTER — Ambulatory Visit

## 2024-12-28 ENCOUNTER — Ambulatory Visit (INDEPENDENT_AMBULATORY_CARE_PROVIDER_SITE_OTHER)

## 2024-12-28 VITALS — BP 129/77 | HR 86 | Ht 62.0 in | Wt 108.0 lb

## 2024-12-28 DIAGNOSIS — M65311 Trigger thumb, right thumb: Secondary | ICD-10-CM | POA: Diagnosis not present

## 2024-12-28 NOTE — Progress Notes (Signed)
 "  Office Visit Note   Patient: Kayla Johnson           Date of Birth: September 05, 1946           MRN: [id number] Visit Date: 12/28/2024              Requested by: Vicci Duwaine SQUIBB, DO [address] [place],  [place] [address] PCP: Vicci Duwaine SQUIBB, DO   Assessment & Plan: Visit Diagnoses:  1. Trigger thumb, right thumb     Plan: Patient much improved, will follow up prn  Orders:  No orders of the defined types were placed in this encounter.    Subjective: Chief Complaint: Right hand pain  HPI Patient is a 79 y.o. year old female who presents for a follow up appointment for the right hand. Patient's symptoms are rapidly improving since last visit. Still has some pain on the volar side of the thumb at times but much better than last visit.  Treatment to date: Injection of the trigger thumb, performed last visit, with good improvement.  Objective: Vital Signs: BP 129/77   Pulse 86   Ht 5' 2 (1.575 m)   Wt 49 kg   BMI 19.75 kg/m   Physical Exam Gen: Alert, No Acute Distress right hand: Skin intact, no erythema or induration noted. TTP volar thumb over MCP. No trigger. Full thumb ROM.  PMFS History: Patient Active Problem List   Diagnosis Date Noted   Alopecia 08/02/2024   IFG (impaired fasting glucose) 06/20/2024   SVT (supraventricular tachycardia) 05/31/2024   Aortic atherosclerosis 11/07/2021   Hypercholesteremia 02/20/2021   Senile purpura 02/20/2021   Vitamin D  deficiency 08/07/2020   Epigastric pain 08/07/2020   Advanced care planning/counseling discussion 04/14/2018   Shoulder impingement syndrome, right 03/03/2018   Lichen sclerosus of female genitalia 12/31/2016   Bilateral tinnitus 12/12/2015   Allergic rhinitis 12/12/2015   Pernicious anemia 10/22/2015   Past Medical History:  Diagnosis Date   Carpal tunnel syndrome    COVID-19 12/06/2020   reported by patient   GERD (gastroesophageal reflux disease)    Lichen sclerosus    Pernicious anemia    Shoulder  bursitis     Family History  Problem Relation Age of Onset   Arthritis Mother    Diabetes Mother    Arthritis Father    Diabetes Sister    Stroke Sister    Diabetes Sister    Ovarian cancer Sister     Past Surgical History:  Procedure Laterality Date   ABDOMINAL HYSTERECTOMY     ESOPHAGOGASTRODUODENOSCOPY (EGD) WITH PROPOFOL  N/A 09/12/2020   Procedure: ESOPHAGOGASTRODUODENOSCOPY (EGD) WITH PROPOFOL ;  Surgeon: Toledo, Ladell POUR, MD;  Location: [facility];  Service: Gastroenterology;  Laterality: N/A;   Molar Pregnancy     S/P D & C   TONSILLECTOMY     Tubes & Ovaries removed  2015   Social History   Occupational History   Not on file  Tobacco Use   Smoking status: Never   Smokeless tobacco: Never  Vaping Use   Vaping status: Never Used  Substance and Sexual Activity   Alcohol use: No    Alcohol/week: 0.0 standard drinks of alcohol   Drug use: No   Sexual activity: Not Currently    Birth control/protection: Post-menopausal   Current Outpatient Medications  Medication Instructions   cetirizine  (ZYRTEC ) 10 mg, Oral, Daily   clobetasol  ointment (TEMOVATE ) 0.05 % 1 application 2 times a week   Cyanocobalamin  (VITAMIN B 12  PO) Daily   diclofenac  Sodium (VOLTAREN ) 2 g, Topical, 4 times daily   fluticasone  (FLONASE ) 50 MCG/ACT nasal spray 2 sprays, Each Nare, Daily   metoprolol  succinate (TOPROL -XL) 25 mg, Oral, Daily PRN   minoxidil (LONITEN) 2.5 mg, Daily   Multiple Vitamins-Minerals (CENTRUM SILVER 50+WOMEN PO) Take by mouth.   omeprazole  (PRILOSEC) 20 mg, Daily   ondansetron  (ZOFRAN  ODT) 4 mg, Oral, Every 8 hours PRN   Simethicone  (GAS-X PO) Take by mouth.   spironolactone (ALDACTONE) 100 mg, Daily   tiZANidine  (ZANAFLEX ) 4 mg, Oral, Every 8 hours PRN   tretinoin (RETIN-A) 0.025 % cream Daily   triamcinolone  cream (KENALOG ) 0.1 % 1 Application, 2 times daily   vitamin E 400 Units, Daily   Allergies as of 12/28/2024 - Review Complete 11/09/2024  Allergen  Reaction Noted   Lidocaine  Tinitus 09/12/2020   "

## 2025-06-26 ENCOUNTER — Encounter: Admitting: Family Medicine
